# Patient Record
Sex: Female | Born: 1970 | Race: White | Hispanic: No | Marital: Married | Smoking: Never smoker
Health system: Southern US, Community
[De-identification: ages and names within clinical notes are randomized; demographics above are authoritative.]

## PROBLEM LIST (undated history)

## (undated) DIAGNOSIS — N289 Disorder of kidney and ureter, unspecified: Secondary | ICD-10-CM

## (undated) DIAGNOSIS — IMO0002 Reserved for concepts with insufficient information to code with codable children: Secondary | ICD-10-CM

## (undated) DIAGNOSIS — R519 Headache, unspecified: Secondary | ICD-10-CM

## (undated) DIAGNOSIS — B029 Zoster without complications: Secondary | ICD-10-CM

## (undated) DIAGNOSIS — K8020 Calculus of gallbladder without cholecystitis without obstruction: Secondary | ICD-10-CM

## (undated) DIAGNOSIS — F988 Other specified behavioral and emotional disorders with onset usually occurring in childhood and adolescence: Secondary | ICD-10-CM

## (undated) DIAGNOSIS — Z808 Family history of malignant neoplasm of other organs or systems: Secondary | ICD-10-CM

## (undated) DIAGNOSIS — E88819 Insulin resistance, unspecified: Secondary | ICD-10-CM

## (undated) DIAGNOSIS — E8881 Metabolic syndrome: Secondary | ICD-10-CM

## (undated) DIAGNOSIS — S060XAA Concussion with loss of consciousness status unknown, initial encounter: Secondary | ICD-10-CM

## (undated) DIAGNOSIS — B019 Varicella without complication: Secondary | ICD-10-CM

## (undated) DIAGNOSIS — K219 Gastro-esophageal reflux disease without esophagitis: Secondary | ICD-10-CM

## (undated) DIAGNOSIS — J189 Pneumonia, unspecified organism: Secondary | ICD-10-CM

## (undated) DIAGNOSIS — F32A Depression, unspecified: Secondary | ICD-10-CM

## (undated) DIAGNOSIS — F419 Anxiety disorder, unspecified: Secondary | ICD-10-CM

## (undated) DIAGNOSIS — N200 Calculus of kidney: Secondary | ICD-10-CM

## (undated) DIAGNOSIS — M329 Systemic lupus erythematosus, unspecified: Secondary | ICD-10-CM

## (undated) DIAGNOSIS — T7840XA Allergy, unspecified, initial encounter: Secondary | ICD-10-CM

## (undated) DIAGNOSIS — E785 Hyperlipidemia, unspecified: Secondary | ICD-10-CM

## (undated) DIAGNOSIS — F329 Major depressive disorder, single episode, unspecified: Secondary | ICD-10-CM

## (undated) DIAGNOSIS — E282 Polycystic ovarian syndrome: Secondary | ICD-10-CM

## (undated) DIAGNOSIS — G8929 Other chronic pain: Secondary | ICD-10-CM

## (undated) HISTORY — PX: LASER ABLATION: SHX1947

## (undated) HISTORY — DX: Allergy, unspecified, initial encounter: T78.40XA

## (undated) HISTORY — DX: Family history of malignant neoplasm of other organs or systems: Z80.8

## (undated) HISTORY — DX: Zoster without complications: B02.9

## (undated) HISTORY — DX: Major depressive disorder, single episode, unspecified: F32.9

## (undated) HISTORY — DX: Anxiety disorder, unspecified: F41.9

## (undated) HISTORY — DX: Metabolic syndrome: E88.81

## (undated) HISTORY — DX: Headache, unspecified: R51.9

## (undated) HISTORY — PX: BUNIONECTOMY: SHX129

## (undated) HISTORY — PX: HIATAL HERNIA REPAIR: SHX195

## (undated) HISTORY — DX: Pneumonia, unspecified organism: J18.9

## (undated) HISTORY — PX: BREAST BIOPSY: SHX20

## (undated) HISTORY — DX: Other specified behavioral and emotional disorders with onset usually occurring in childhood and adolescence: F98.8

## (undated) HISTORY — DX: Systemic lupus erythematosus, unspecified: M32.9

## (undated) HISTORY — DX: Insulin resistance, unspecified: E88.819

## (undated) HISTORY — DX: Hyperlipidemia, unspecified: E78.5

## (undated) HISTORY — DX: Calculus of gallbladder without cholecystitis without obstruction: K80.20

## (undated) HISTORY — PX: COLONOSCOPY: SHX174

## (undated) HISTORY — DX: Varicella without complication: B01.9

## (undated) HISTORY — DX: Polycystic ovarian syndrome: E28.2

## (undated) HISTORY — DX: Calculus of kidney: N20.0

## (undated) HISTORY — PX: CHOLECYSTECTOMY: SHX55

## (undated) HISTORY — DX: Depression, unspecified: F32.A

## (undated) HISTORY — PX: TONSILLECTOMY: SUR1361

## (undated) HISTORY — DX: Other chronic pain: G89.29

## (undated) HISTORY — PX: WISDOM TOOTH EXTRACTION: SHX21

---
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
## 2000-01-20 ENCOUNTER — Other Ambulatory Visit: Admission: RE | Admit: 2000-01-20 | Discharge: 2000-01-20 | Payer: Self-pay | Admitting: Obstetrics and Gynecology

## 2001-05-17 DIAGNOSIS — J189 Pneumonia, unspecified organism: Secondary | ICD-10-CM

## 2001-05-17 HISTORY — DX: Pneumonia, unspecified organism: J18.9

## 2005-05-25 ENCOUNTER — Other Ambulatory Visit: Admission: RE | Admit: 2005-05-25 | Discharge: 2005-05-25 | Payer: Self-pay | Admitting: Obstetrics and Gynecology

## 2005-08-30 ENCOUNTER — Ambulatory Visit: Payer: Self-pay | Admitting: Internal Medicine

## 2006-04-29 ENCOUNTER — Ambulatory Visit (HOSPITAL_COMMUNITY): Admission: RE | Admit: 2006-04-29 | Discharge: 2006-04-29 | Payer: Self-pay | Admitting: Obstetrics and Gynecology

## 2006-05-07 ENCOUNTER — Emergency Department (HOSPITAL_COMMUNITY): Admission: EM | Admit: 2006-05-07 | Discharge: 2006-05-07 | Payer: Self-pay | Admitting: Emergency Medicine

## 2006-09-18 ENCOUNTER — Inpatient Hospital Stay (HOSPITAL_COMMUNITY): Admission: AD | Admit: 2006-09-18 | Discharge: 2006-09-18 | Payer: Self-pay | Admitting: Obstetrics and Gynecology

## 2006-09-22 ENCOUNTER — Encounter (INDEPENDENT_AMBULATORY_CARE_PROVIDER_SITE_OTHER): Payer: Self-pay | Admitting: Specialist

## 2006-09-22 ENCOUNTER — Inpatient Hospital Stay (HOSPITAL_COMMUNITY): Admission: AD | Admit: 2006-09-22 | Discharge: 2006-09-24 | Payer: Self-pay | Admitting: Obstetrics and Gynecology

## 2007-03-06 ENCOUNTER — Telehealth: Payer: Self-pay | Admitting: Internal Medicine

## 2007-03-07 ENCOUNTER — Ambulatory Visit: Payer: Self-pay | Admitting: Internal Medicine

## 2007-03-07 DIAGNOSIS — R109 Unspecified abdominal pain: Secondary | ICD-10-CM | POA: Insufficient documentation

## 2007-03-07 DIAGNOSIS — F329 Major depressive disorder, single episode, unspecified: Secondary | ICD-10-CM

## 2007-03-07 DIAGNOSIS — F3289 Other specified depressive episodes: Secondary | ICD-10-CM | POA: Insufficient documentation

## 2007-03-07 DIAGNOSIS — R0609 Other forms of dyspnea: Secondary | ICD-10-CM

## 2007-03-07 DIAGNOSIS — R0989 Other specified symptoms and signs involving the circulatory and respiratory systems: Secondary | ICD-10-CM

## 2007-03-07 LAB — CONVERTED CEMR LAB
AST: 18 units/L (ref 0–37)
BUN: 12 mg/dL (ref 6–23)
Bilirubin Urine: NEGATIVE
Bilirubin, Direct: 0.1 mg/dL (ref 0.0–0.3)
CO2: 33 meq/L — ABNORMAL HIGH (ref 19–32)
Chloride: 105 meq/L (ref 96–112)
Creatinine, Ser: 0.8 mg/dL (ref 0.4–1.2)
Glucose, Bld: 74 mg/dL (ref 70–99)
HCT: 37.5 % (ref 36.0–46.0)
Hemoglobin: 13 g/dL (ref 12.0–15.0)
Lymphocytes Relative: 25.5 % (ref 12.0–46.0)
MCHC: 34.7 g/dL (ref 30.0–36.0)
MCV: 83.6 fL (ref 78.0–100.0)
Monocytes Absolute: 0.7 10*3/uL (ref 0.2–0.7)
Monocytes Relative: 13.4 % — ABNORMAL HIGH (ref 3.0–11.0)
Neutrophils Relative %: 60.2 % (ref 43.0–77.0)
Platelets: 235 10*3/uL (ref 150–400)
Specific Gravity, Urine: 1.02
Total Bilirubin: 0.5 mg/dL (ref 0.3–1.2)
Total Protein: 7.1 g/dL (ref 6.0–8.3)
Urobilinogen, UA: 0.2
WBC: 5.5 10*3/uL (ref 4.5–10.5)
pH: 6.5

## 2007-03-09 ENCOUNTER — Telehealth: Payer: Self-pay | Admitting: *Deleted

## 2007-03-13 ENCOUNTER — Ambulatory Visit: Payer: Self-pay | Admitting: Cardiology

## 2007-03-13 ENCOUNTER — Encounter: Payer: Self-pay | Admitting: Internal Medicine

## 2007-03-17 ENCOUNTER — Ambulatory Visit: Payer: Self-pay | Admitting: Internal Medicine

## 2007-03-17 DIAGNOSIS — I880 Nonspecific mesenteric lymphadenitis: Secondary | ICD-10-CM

## 2007-03-17 DIAGNOSIS — N200 Calculus of kidney: Secondary | ICD-10-CM | POA: Insufficient documentation

## 2007-03-17 DIAGNOSIS — R197 Diarrhea, unspecified: Secondary | ICD-10-CM

## 2007-03-22 ENCOUNTER — Ambulatory Visit: Payer: Self-pay | Admitting: Internal Medicine

## 2007-03-22 DIAGNOSIS — R109 Unspecified abdominal pain: Secondary | ICD-10-CM | POA: Insufficient documentation

## 2007-03-23 ENCOUNTER — Encounter: Payer: Self-pay | Admitting: Internal Medicine

## 2007-03-24 ENCOUNTER — Telehealth: Payer: Self-pay | Admitting: Internal Medicine

## 2007-04-12 ENCOUNTER — Telehealth: Payer: Self-pay | Admitting: Internal Medicine

## 2007-08-11 ENCOUNTER — Encounter: Payer: Self-pay | Admitting: Internal Medicine

## 2007-08-15 ENCOUNTER — Telehealth: Payer: Self-pay | Admitting: Internal Medicine

## 2007-10-06 ENCOUNTER — Encounter: Payer: Self-pay | Admitting: Internal Medicine

## 2007-10-13 ENCOUNTER — Ambulatory Visit: Payer: Self-pay | Admitting: Internal Medicine

## 2007-10-13 DIAGNOSIS — G43909 Migraine, unspecified, not intractable, without status migrainosus: Secondary | ICD-10-CM | POA: Insufficient documentation

## 2007-10-13 DIAGNOSIS — G43919 Migraine, unspecified, intractable, without status migrainosus: Secondary | ICD-10-CM | POA: Insufficient documentation

## 2007-10-13 DIAGNOSIS — J309 Allergic rhinitis, unspecified: Secondary | ICD-10-CM | POA: Insufficient documentation

## 2007-10-13 DIAGNOSIS — Z87442 Personal history of urinary calculi: Secondary | ICD-10-CM | POA: Insufficient documentation

## 2007-11-15 ENCOUNTER — Telehealth: Payer: Self-pay | Admitting: Internal Medicine

## 2007-11-16 ENCOUNTER — Telehealth: Payer: Self-pay | Admitting: Internal Medicine

## 2007-11-21 ENCOUNTER — Telehealth: Payer: Self-pay | Admitting: Family Medicine

## 2007-11-24 ENCOUNTER — Ambulatory Visit: Payer: Self-pay | Admitting: Internal Medicine

## 2007-11-26 DIAGNOSIS — R51 Headache: Secondary | ICD-10-CM

## 2007-11-26 DIAGNOSIS — R519 Headache, unspecified: Secondary | ICD-10-CM | POA: Insufficient documentation

## 2007-12-03 ENCOUNTER — Telehealth: Payer: Self-pay | Admitting: Family Medicine

## 2007-12-04 ENCOUNTER — Emergency Department (HOSPITAL_COMMUNITY): Admission: EM | Admit: 2007-12-04 | Discharge: 2007-12-04 | Payer: Self-pay | Admitting: *Deleted

## 2007-12-05 ENCOUNTER — Telehealth: Payer: Self-pay | Admitting: *Deleted

## 2007-12-05 ENCOUNTER — Ambulatory Visit: Payer: Self-pay | Admitting: Internal Medicine

## 2007-12-05 DIAGNOSIS — F909 Attention-deficit hyperactivity disorder, unspecified type: Secondary | ICD-10-CM | POA: Insufficient documentation

## 2007-12-11 ENCOUNTER — Telehealth: Payer: Self-pay | Admitting: Internal Medicine

## 2008-03-07 ENCOUNTER — Telehealth (INDEPENDENT_AMBULATORY_CARE_PROVIDER_SITE_OTHER): Payer: Self-pay | Admitting: *Deleted

## 2008-03-12 ENCOUNTER — Telehealth: Payer: Self-pay | Admitting: *Deleted

## 2008-07-15 ENCOUNTER — Ambulatory Visit: Payer: Self-pay | Admitting: Internal Medicine

## 2008-07-15 DIAGNOSIS — R5383 Other fatigue: Secondary | ICD-10-CM

## 2008-07-15 DIAGNOSIS — R5381 Other malaise: Secondary | ICD-10-CM

## 2008-07-15 LAB — CONVERTED CEMR LAB: Pap Smear: NORMAL

## 2008-07-18 ENCOUNTER — Ambulatory Visit: Payer: Self-pay | Admitting: Internal Medicine

## 2008-07-19 ENCOUNTER — Encounter: Payer: Self-pay | Admitting: Internal Medicine

## 2008-07-19 LAB — CONVERTED CEMR LAB: PTH: 27 pg/mL (ref 14.0–72.0)

## 2008-07-30 ENCOUNTER — Ambulatory Visit: Payer: Self-pay | Admitting: Internal Medicine

## 2008-07-30 DIAGNOSIS — J029 Acute pharyngitis, unspecified: Secondary | ICD-10-CM | POA: Insufficient documentation

## 2008-07-31 ENCOUNTER — Encounter: Payer: Self-pay | Admitting: Internal Medicine

## 2008-09-27 ENCOUNTER — Ambulatory Visit: Payer: Self-pay | Admitting: Internal Medicine

## 2008-09-27 DIAGNOSIS — G479 Sleep disorder, unspecified: Secondary | ICD-10-CM | POA: Insufficient documentation

## 2008-11-21 ENCOUNTER — Telehealth: Payer: Self-pay | Admitting: Internal Medicine

## 2009-01-14 ENCOUNTER — Telehealth: Payer: Self-pay | Admitting: *Deleted

## 2009-01-14 ENCOUNTER — Encounter: Payer: Self-pay | Admitting: Internal Medicine

## 2009-05-29 ENCOUNTER — Telehealth: Payer: Self-pay | Admitting: *Deleted

## 2009-07-01 ENCOUNTER — Ambulatory Visit: Payer: Self-pay | Admitting: Internal Medicine

## 2009-07-01 ENCOUNTER — Telehealth: Payer: Self-pay | Admitting: Internal Medicine

## 2009-07-01 DIAGNOSIS — H9209 Otalgia, unspecified ear: Secondary | ICD-10-CM | POA: Insufficient documentation

## 2009-12-19 ENCOUNTER — Ambulatory Visit: Payer: Self-pay | Admitting: Internal Medicine

## 2009-12-19 DIAGNOSIS — H60399 Other infective otitis externa, unspecified ear: Secondary | ICD-10-CM | POA: Insufficient documentation

## 2010-01-20 ENCOUNTER — Telehealth: Payer: Self-pay | Admitting: *Deleted

## 2010-02-13 ENCOUNTER — Telehealth: Payer: Self-pay | Admitting: *Deleted

## 2010-04-03 ENCOUNTER — Encounter: Payer: Self-pay | Admitting: Internal Medicine

## 2010-04-27 ENCOUNTER — Encounter
Admission: RE | Admit: 2010-04-27 | Discharge: 2010-04-27 | Payer: Self-pay | Source: Home / Self Care | Attending: Endocrinology | Admitting: Endocrinology

## 2010-06-14 LAB — CONVERTED CEMR LAB
Albumin: 3.9 g/dL (ref 3.5–5.2)
Basophils Relative: 0.2 % (ref 0.0–3.0)
Calcium: 9.2 mg/dL (ref 8.4–10.5)
Chloride: 107 meq/L (ref 96–112)
Creatinine, Ser: 0.8 mg/dL (ref 0.4–1.2)
Eosinophils Relative: 2.2 % (ref 0.0–5.0)
Free T4: 0.7 ng/dL (ref 0.6–1.6)
GFR calc Af Amer: 104 mL/min
Hemoglobin: 13.4 g/dL (ref 12.0–15.0)
LDL Cholesterol: 101 mg/dL — ABNORMAL HIGH (ref 0–99)
Monocytes Absolute: 0.7 10*3/uL (ref 0.1–1.0)
Monocytes Relative: 18.1 % — ABNORMAL HIGH (ref 3.0–12.0)
Neutrophils Relative %: 44.4 % (ref 43.0–77.0)
RBC: 4.67 M/uL (ref 3.87–5.11)
RDW: 13.1 % (ref 11.5–14.6)
T3, Free: 3.3 pg/mL (ref 2.3–4.2)
TSH: 1.51 microintl units/mL (ref 0.35–5.50)
Total Bilirubin: 0.8 mg/dL (ref 0.3–1.2)
Total CHOL/HDL Ratio: 4.9
Total Protein: 7 g/dL (ref 6.0–8.3)
Triglycerides: 105 mg/dL (ref 0–149)
WBC: 3.9 10*3/uL — ABNORMAL LOW (ref 4.5–10.5)

## 2010-06-18 NOTE — Assessment & Plan Note (Signed)
Summary: viral illness?/dm   Vital Signs:  Patient profile:   40 year old female Menstrual status:  irregular LMP:     06/25/2009 Height:      64.75 inches Weight:      178 pounds BMI:     29.96 Temp:     98.2 degrees F oral Pulse rate:   66 / minute BP sitting:   122 / 72  (left arm) Cuff size:   regular  Vitals Entered By: Romualdo Bolk, CMA (AAMA) (July 01, 2009 1:08 PM) CC: ?flu- Coughing, no fever, fatigue, sinus and ear pain but this has passed. Now just has coughing and fatigue. Some ear and sinus pain. This has been going on for 2 weeks. LMP (date): 06/25/2009 LMP - Character: normal-IUD Menarche (age onset years): 11   Menses interval (days): 29-30 Menstrual flow (days): 3-4 Enter LMP: 06/25/2009 Last PAP Result normal   History of Present Illness: Jordan Mayer comesin as a work in  for above.  Onset with HA and  cough and then ear pain   left more than right  anmd sinus pain and  exhausted.   going on for 2 weeks.   Was using netti pot  early on.    no sig nasal congestion now .   Cough is croupy now deep  harder to sleep last pm. .   .   Tendency for bronchitis  ? post infectious  ?  has hx of pneumonia    NO fever.   but did have some chills . No cp or sob.   Allergies not flaring now but has hx of sig allergy. Family also tends to get prolonged oughing illnesses.   Preventive Screening-Counseling & Management  Alcohol-Tobacco     Alcohol drinks/day: <1     Alcohol type: wine     Smoking Status: never  Caffeine-Diet-Exercise     Caffeine use/day: 2-3 per week     Does Patient Exercise: yes     Type of exercise: tennis, cardio and wts     Exercise (avg: min/session): >60     Times/week: 4-8  Current Medications (verified): 1)  Xyzal 5 Mg  Tabs (Levocetirizine Dihydrochloride) 2)  Multivitamins   Tabs (Multiple Vitamin) 3)  Ventolin Hfa 108 (90 Base) Mcg/act  Aers (Albuterol Sulfate) .Marland Kitchen.. 1-2 Puffs Pre Exercise As Needed 4)  Nasonex 50 Mcg/act   Susp (Mometasone Furoate) 5)  Sonata 10 Mg Caps (Zaleplon) .Marland Kitchen.. 1 By Mouth At Bedtime 6)  Mirena 20 Mcg/24hr Iud (Levonorgestrel) 7)  Celexa 10 Mg Tabs (Citalopram Hydrobromide) .Marland Kitchen.. 1 By Mouth Once Daily 8)  Alprazolam 0.25 Mg Tabs (Alprazolam) .... 1/2 To 1 By Mouth Once Daily  Allergies (verified): 1)  ! Penicillin 2)  ! * Ivp Dye  Past History:  Past medical, surgical, family and social histories (including risk factors) reviewed, and no changes noted (except as noted below).  Past Medical History: Depression childbirth x1  g1p1 Allergic rhinitis  with asthma Nephrolithiasis, hx of Headache Hx of   infertility ?   Was told shecould have CFS  hx [facility] Consults Dr. Merry Lofty Dr. Deedra Ehrich   Past History:  Care Management: Gynecology: Dr. Aldona Bar Allergy: Dr. Gary Fleet Psychiatry: Excell Seltzer  Family History: Reviewed history from 07/15/2008 and no changes required. noncontributory no scd  Father:  healthy Mother:  healthy Siblings:  healthy  NO hormonal syndromes noted  Social History: Reviewed history from 07/15/2008 and no changes required. Married non-smoker child at home  Review of Systems       The patient complains of hoarseness and headaches.  The patient denies anorexia, weight loss, decreased hearing, chest pain, syncope, dyspnea on exertion, peripheral edema, hemoptysis, abdominal pain, melena, hematochezia, severe indigestion/heartburn, transient blindness, difficulty walking, abnormal bleeding, and angioedema.         had enlarged left ac node at onset   Physical Exam  General:  alert, well-developed, well-nourished, and well-hydrated.  looks washed out. Head:  normocephalic and atraumatic.   Eyes:  vision grossly intact, pupils equal, and pupils round.   Ears:  R ear normal, L ear normal, and no external deformities.  some tenderness at tmj area  Nose:  no external deformity, no external erythema, and no nasal discharge.  looks 1+ congested   non tender  Mouth:  mild erythema no lesions Neck:  shoddy nodes  Lungs:  Normal respiratory effort, chest expands symmetrically. Lungs are clear to auscultation, no crackles or wheezes.no dullness.   Heart:  Normal rate and regular rhythm. S1 and S2 normal without gallop, murmur, click, rub or other extra sounds.no lifts.   Pulses:  pulses intact without delay   Neurologic:  non focal  Skin:  turgor normal, color normal, no ecchymoses, and no petechiae.   Cervical Nodes:  no posterior cervical adenopathy.  shoddy nodes  anterior  Psych:  Oriented X3, good eye contact, not anxious appearing, and not depressed appearing.     Impression & Recommendations:  Problem # 1:  OTALGIA (ICD-388.70) with headache and cough    ongoing and unclear  which cause but ok to empirically treat for atypcials  with past hx  .  HA is separate. Her updated medication list for this problem includes:    Azithromycin 250 Mg Tabs (Azithromycin) .Marland Kitchen... Take 2 by mouth  x 1 then 1 by mouth once daily  Problem # 2:  COUGH (ICD-786.2) prob infectious   viral vs  atypical possible.    hx of prolonged coughs   consider allergic phenom also  with hx of asthma if ongoing.      Complete Medication List: 1)  Xyzal 5 Mg Tabs (Levocetirizine dihydrochloride) 2)  Multivitamins Tabs (Multiple vitamin) 3)  Ventolin Hfa 108 (90 Base) Mcg/act Aers (Albuterol sulfate) .Marland Kitchen.. 1-2 puffs pre exercise as needed 4)  Nasonex 50 Mcg/act Susp (Mometasone furoate) 5)  Sonata 10 Mg Caps (Zaleplon) .Marland Kitchen.. 1 by mouth at bedtime 6)  Mirena 20 Mcg/24hr Iud (Levonorgestrel) 7)  Celexa 10 Mg Tabs (Citalopram hydrobromide) .Marland Kitchen.. 1 by mouth once daily 8)  Alprazolam 0.25 Mg Tabs (Alprazolam) .... 1/2 to 1 by mouth once daily 9)  Azithromycin 250 Mg Tabs (Azithromycin) .... Take 2 by mouth  x 1 then 1 by mouth once daily  Patient Instructions: 1)  call if  cough not improving in the next 7-10 days  2)  consider prednisone then 3)  Also  call if any  fever.  Prescriptions: AZITHROMYCIN 250 MG TABS (AZITHROMYCIN) take 2 by mouth  x 1 then 1 by mouth once daily  #6 x 0   Entered and Authorized by:   Madelin Headings MD   Signed by:   Madelin Headings MD on 07/01/2009   Method used:   [facility]        [facility] [address] [phone]* (retail)       [address]       [place], [place]  [address]       Ph: [phone] or [phone]       Fax: [phone]   RxID:   [id number]

## 2010-06-18 NOTE — Progress Notes (Signed)
Summary: appt?  Phone Note Call from Patient   Caller: Patient Call For: Madelin Headings MD Reason for Call: Acute Illness Complaint: Cough/Sore throat Summary of Call: Pt is coughing, has head congestion, headache with eye pain x 2 weeks, and is asking to see Dr. Fabian Sharp.  No fever but feels really bad [phone] Initial call taken by: Lynann Beaver CMA,  July 01, 2009 9:15 AM  Follow-up for Phone Call        would have to  work her in  in the afternoon around 115   or else see another provider  Follow-up by: Madelin Headings MD,  July 01, 2009 9:25 AM  Additional Follow-up for Phone Call Additional follow up Details #1::        Scheduled appt. Additional Follow-up by: Lynann Beaver CMA,  July 01, 2009 9:33 AM

## 2010-06-18 NOTE — Assessment & Plan Note (Signed)
Summary: ear inj/pain/cjr   Vital Signs:  Patient profile:   40 year old female Menstrual status:  iud Height:      64.75 inches Weight:      178 pounds Temp:     98.6 degrees F oral Pulse rate:   72 / minute BP sitting:   120 / 70  (left arm) Cuff size:   regular  Vitals Entered By: Romualdo Bolk, CMA (AAMA) (December 19, 2009 9:56 AM) CC: Left ear, teeth, neck and jaw pain that started on 8/4. Pt states that she had some sinus pressure going this week. No coughing or congestion. LMP - Character: normal-IUD Menarche (age onset years): 11   Menses interval (days): 29-30 Menstrual flow (days): 3-4 Menstrual Status iud Last PAP Result normal   History of Present Illness: Jordan Mayer comes in today  for sda today for above .  had mild congestion and then awoke from sleep with left ear pain  slightly  less upright now . pain continuous and interfereing .   no fever URI signs or hurts to swallow . tried   no meds.      Has had a  allergy congestion recently but no fever.  Swims 3  per [facility] and plays tennis. nothing in ear.  No dental pain.Marland Kitchen left face feels  somehat involved but no face pain.   Preventive Screening-Counseling & Management  Alcohol-Tobacco     Alcohol drinks/day: <1     Alcohol type: wine     Smoking Status: never  Caffeine-Diet-Exercise     Caffeine use/day: 2-3 per week     Does Patient Exercise: yes     Type of exercise: tennis, cardio and wts     Exercise (avg: min/session): >60     Times/week: 4-8  Current Medications (verified): 1)  Xyzal 5 Mg  Tabs (Levocetirizine Dihydrochloride) 2)  Multivitamins   Tabs (Multiple Vitamin) 3)  Ventolin Hfa 108 (90 Base) Mcg/act  Aers (Albuterol Sulfate) .Marland Kitchen.. 1-2 Puffs Pre Exercise As Needed 4)  Nasonex 50 Mcg/act  Susp (Mometasone Furoate) 5)  Sonata 10 Mg Caps (Zaleplon) .Marland Kitchen.. 1 By Mouth At Bedtime 6)  Mirena 20 Mcg/24hr Iud (Levonorgestrel) 7)  Adderall 10 Mg Tabs (Amphetamine-Dextroamphetamine) .... 1/2 To 1  Once Daily As Needed  Allergies (verified): 1)  ! Penicillin 2)  ! * Ivp Dye  Past History:  Past medical, surgical, family and social histories (including risk factors) reviewed, and no changes noted (except as noted below).  Past Medical History: Reviewed history from 07/01/2009 and no changes required. 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   Was told shecould have CFS  hx [facility] Consults Dr. Merry Lofty Dr. Deedra Ehrich   Past History:  Care Management: Gynecology: Dr. Aldona Bar Allergy: Dr. Gary Fleet Psychiatry: Excell Seltzer  Family History: Reviewed history from 07/15/2008 and no changes required. noncontributory no scd  Father:  healthy Mother:  healthy Siblings:  healthy  NO hormonal syndromes noted  Social History: Reviewed history from 07/15/2008 and no changes required. Married non-smoker child at home        Review of Systems  The patient denies anorexia, fever, weight loss, weight gain, vision loss, prolonged cough, abdominal pain, and abnormal bleeding.    Physical Exam  General:  Well-developed,well-nourished,in no acute distress; alert,appropriate and cooperative throughout examination Head:  Normocephalic and atraumatic without obvious abnormalities. No apparent alopecia or balding. Eyes:  PERRL, EOMs full,  conjunctiva clear  Ears:  left eac 2+ edema and redness R ear normal, R pinna tender, and R tragus tender.   no drainage  tm bony lm nls  sight serythema to pars tnsa with streaking ? clear fluid  Nose:  no external deformity, no external erythema, and no nasal discharge.   Mouth:  good dentition and pharynx pink and moist.   Neck:  tender lef ac area but no adenopathy and no face pain Lungs:  Normal respiratory effort, chest expands symmetrically. Lungs are clear to auscultation, no crackles or wheezes. Heart:  Normal rate and regular rhythm. S1 and S2 normal without gallop,  murmur, click, rub or other extra sounds. Pulses:  nl cap refill  Neurologic:  grossly non focal  Skin:  turgor normal and color normal.   Cervical Nodes:  no posterior cervical adenopathy and L anterior LN tender.   Psych:  Oriented X3, good eye contact, not anxious appearing, and not depressed appearing.     Impression & Recommendations:  Problem # 1:  OTITIS EXTERNA (ICD-380.10)  righ.Marland Kitchent  disc   rx  Discussed symptomatic treatment and preventive measures.   Orders: Prescription Created Electronically [phone])  Problem # 2:  OTALGIA (ICD-388.70)  may have a sinusitis  also based on character of pain and exam ...  vs referred pain.   caution aabout theoreticalx rx pcn and  med  low risk The following medications were removed from the medication list:    Azithromycin 250 Mg Tabs (Azithromycin) .Marland Kitchen... Take 2 by mouth  x 1 then 1 by mouth once daily Her updated medication list for this problem includes:    Cefdinir 300 Mg Caps (Cefdinir) .Marland Kitchen... 1 by mouth two times a day  for sinusitis  Orders: Prescription Created Electronically [phone])  Complete Medication List: 1)  Xyzal 5 Mg Tabs (Levocetirizine dihydrochloride) 2)  Multivitamins Tabs (Multiple vitamin) 3)  Ventolin Hfa 108 (90 Base) Mcg/act Aers (Albuterol sulfate) .Marland Kitchen.. 1-2 puffs pre exercise as needed 4)  Nasonex 50 Mcg/act Susp (Mometasone furoate) 5)  Sonata 10 Mg Caps (Zaleplon) .Marland Kitchen.. 1 by mouth at bedtime 6)  Mirena 20 Mcg/24hr Iud (Levonorgestrel) 7)  Adderall 10 Mg Tabs (Amphetamine-dextroamphetamine) .... 1/2 to 1 once daily as needed 8)  Ofloxacin 0.3 % Soln (Ofloxacin) .Marland Kitchen.. 10 qtts   in left ear canal once daily for 7 days or as directed . 9)  Cefdinir 300 Mg Caps (Cefdinir) .Marland Kitchen.. 1 by mouth two times a day  for sinusitis  Patient Instructions: 1)  treat for  otitis externa   ...  2)  add an  oral antibioitc  if  sinus pain and ear pain getting worse .  Prescriptions: CEFDINIR 300 MG CAPS (CEFDINIR) 1 by mouth two  times a day  for sinusitis  #20 x 0   Entered and Authorized by:   Madelin Headings MD   Signed by:   Madelin Headings MD on 12/19/2009   Method used:   Print then Give to Patient   RxID:   [id number] OFLOXACIN 0.3 % SOLN (OFLOXACIN) 10 qtts   in left ear canal once daily for 7 days or as directed .  #1 bottle x 0   Entered and Authorized by:   Madelin Headings MD   Signed by:   Madelin Headings MD on 12/19/2009   Method used:   [facility] to        [facility] [address] [phone]* (retail)  [address]       [place], [place]  [address]       Ph: [phone] or [phone]       Fax: [phone]   RxID:   [id number]

## 2010-06-18 NOTE — Letter (Signed)
Summary: [facility]   Imported By: Maryln Gottron 05/20/2010 15:59:19  _____________________________________________________________________  External Attachment:    Type:   Image     Comment:   External Document

## 2010-06-18 NOTE — Progress Notes (Signed)
Summary: referral to Dr. Horald Pollen   Phone Note Call from Patient Call back at [phone]   Caller: Patient Summary of Call: Pt is wanting a referral to Dr. Talmage Nap because her thyroid being off. Pt also had a problem trying to get preg. Initial call taken by: Romualdo Bolk, CMA Duncan Dull),  February 13, 2010 2:39 PM  Follow-up for Phone Call        ok to refer send  labs from the last year and OV here from the last year. Follow-up by: Madelin Headings MD,  February 13, 2010 5:07 PM  Additional Follow-up for Phone Call Additional follow up Details #1::        Order sent to [facility]. Additional Follow-up by: Romualdo Bolk, CMA (AAMA),  February 13, 2010 5:12 PM

## 2010-06-18 NOTE — Progress Notes (Signed)
Summary: Pt req refill of Inhaler. [facility]  Phone Note Call from Patient Call back at [phone] cell   Caller: Patient Summary of Call: Pt called and is req script for another Inhaler. [facility]   Initial call taken by: Lucy Antigua,  January 20, 2010 4:41 PM  Follow-up for Phone Call        ok x 1  Follow-up by: Madelin Headings MD,  January 21, 2010 9:30 AM  Additional Follow-up for Phone Call Additional follow up Details #1::        Rx sent to pharmacy Additional Follow-up by: Romualdo Bolk, CMA Duncan Dull),  January 21, 2010 11:35 AM    Prescriptions: VENTOLIN HFA 108 (90 BASE) MCG/ACT  AERS (ALBUTEROL SULFATE) 1-2 puffs pre exercise as needed  #1 x 0   Entered by:   Romualdo Bolk, CMA (AAMA)   Authorized by:   Madelin Headings MD   Signed by:   Romualdo Bolk, CMA (AAMA) on 01/21/2010   Method used:   Electronically to        [facility] [address] [phone]* (retail)       [address]       [place], [place]  [address]       Ph: [phone] or [phone]       Fax: [phone]   RxID:   [id number]

## 2010-06-18 NOTE — Progress Notes (Signed)
Summary: Pt needing a rx of xanax  Phone Note Call from Patient Call back at [facility] Phone [phone] Call back at [phone] (cell)    Caller: Patient Summary of Call: Pt left a voicemail saying that she needs some xanax. I left pt a message to give [place] more info on why she is needing this and if she is seeing Valinda Hoar or not. Initial call taken by: Romualdo Bolk, CMA Duncan Dull),  May 29, 2009 11:43 AM  Follow-up for Phone Call        Spoke to pt and she states that she is planning an event in March for 600 people. She is not currently seeing Valinda Hoar. Pt states that she is under lot of stress trying to plan this event and needs something to help calm her down or take the edge off. [facility]. Pt is willing to come in if needed.  Follow-up by: Romualdo Bolk, CMA Duncan Dull),  June 03, 2009 10:28 AM  Additional Follow-up for Phone Call Additional follow up Details #1::        needs OV  Additional Follow-up by: Madelin Headings MD,  June 03, 2009 5:36 PM    Additional Follow-up for Phone Call Additional follow up Details #2::    Pt aware and appt made Follow-up by: Romualdo Bolk, CMA (AAMA),  June 04, 2009 8:58 AM

## 2010-09-17 ENCOUNTER — Ambulatory Visit (INDEPENDENT_AMBULATORY_CARE_PROVIDER_SITE_OTHER): Payer: 59 | Admitting: Internal Medicine

## 2010-09-17 ENCOUNTER — Encounter: Payer: Self-pay | Admitting: Internal Medicine

## 2010-09-17 DIAGNOSIS — H9209 Otalgia, unspecified ear: Secondary | ICD-10-CM

## 2010-09-17 DIAGNOSIS — H9203 Otalgia, bilateral: Secondary | ICD-10-CM | POA: Insufficient documentation

## 2010-09-17 NOTE — Progress Notes (Signed)
  Subjective:    Patient ID: Jordan Mayer, female    DOB: July 18, 1970, 40 y.o.   MRN: [id number]  HPI Pt presents to clinic for evaluation of ear pain. Awoke this am with bilateral ear pain without discharge, loss of hearing, neck pain, fever or chills. Has chronic allergic rhinits sx's of nasal congestion and drainage treated with nasonex and xyzal. Pain does not radiate towards neck and is not worsened with jaw movement. No other alleviating or exacerbating factors. No other complaints.  Reviewed pmh, medications and allergies    Review of Systems  Constitutional: Negative for fever and chills.  HENT: Positive for ear pain, congestion and rhinorrhea. Negative for hearing loss, facial swelling, neck pain, neck stiffness and ear discharge.   Eyes: Negative for discharge and redness.  Respiratory: Negative for cough.        Objective:   Physical Exam  [nursing notereviewed. Constitutional: She appears well-developed and well-nourished. No distress.  HENT:  Head: Normocephalic and atraumatic.  Right Ear: Tympanic membrane, external ear and ear canal normal.  Left Ear: Tympanic membrane, external ear and ear canal normal.  Nose: Nose normal.  Mouth/Throat: Oropharynx is clear and moist. No oropharyngeal exudate.  Eyes: Conjunctivae are normal. No scleral icterus.  Neurological: She is alert.  Skin: Skin is warm and dry. She is not diaphoretic.          Assessment & Plan:

## 2010-09-17 NOTE — Assessment & Plan Note (Signed)
Nl exam. No current evidence to support OM.  Consider eustacian tube etiology. Increase nasonex 2 sprays qd temporarily, continue antihistamine and begin short course of decongestant.  Followup if no improvement or worsening.

## 2010-09-29 NOTE — Assessment & Plan Note (Signed)
[facility]                            CARDIOLOGY OFFICE NOTE   NAME:Jordan Mayer, Jordan Mayer                     MRN:          [id number]  DATE:03/13/2007                            DOB:          Apr 27, 1971    PRIMARY CARE PHYSICIAN:  Neta Mends. Panosh, MD   I was asked as the office physician of the day to assess [name]. She was referred today by Dr.  Fabian Sharp for an outpatient abdominal  and pelvic CT scan with contrast, reportedly due to a history of  abdominal pain. She reported a drug allergy to PENICILLIN, but otherwise  no other specific problems with iodine, shellfish, seafood or  intravenous contrast. She completed her CT scan with no obvious  difficulties. It was noted after the fact that she described a mild  sensation of itching on one of her fingers and scratched this area. The  CT technologist reported seeing a hive on the patient's knuckle of  this finger. The patient also states that she had a general sense of  itching on her right forearm in a fairly focal area and scratched this  with a resulting wheal. She otherwise had no rash or urticaria and was  stable with no complaints and vital signs including oxygen saturation of  97% on room air, heart rate of 67 and regular, and a blood pressure of  90/58. She was observed further and ambulated, and only stated that she  had a mild feeling of itching on the corner of her eye and around her  neck, but again, with no frank rash and no urticaria. She was reassessed  with oxygen saturation of 98% on room air, heart rate 62 and blood  pressure of 102/88. Again, with no other major symptomatology. We  elected to administer Benadryl 50 mg in the clinic and called the  patient's husband to ask him to come and transport the patient home  given concerns about somnolence and driving. We recommended that the  patient take scheduled Benadryl for the next 24 hours and observe her  symptoms closely.  She was also given a post-contrast instruction sheet.  We explained that if she had any worsening in symptoms that she should  call the provided number immediately and/or be evaluated more urgently  through the emergency department. Otherwise, if she continued to remain  stable, we asked her to call her primary care physicians office the  following day and relay this information to see if additional followup  was required. The patient and her husband were very comfortable with  this.     Jonelle Sidle, MD  Electronically Signed    SGM/MedQ  DD: 03/13/2007  DT: 03/13/2007  Job #: [id number]   cc:   Neta Mends. Fabian Sharp, MD

## 2011-02-12 LAB — POCT PREGNANCY, URINE: Preg Test, Ur: NEGATIVE

## 2011-02-12 LAB — DIFFERENTIAL
Basophils Absolute: 0
Eosinophils Absolute: 0.1
Eosinophils Relative: 2
Lymphocytes Relative: 33

## 2011-02-12 LAB — CBC
Hemoglobin: 12.3
MCHC: 33.5
MCV: 83.5
RBC: 4.41
WBC: 5.9

## 2011-02-12 LAB — POCT I-STAT, CHEM 8
Calcium, Ion: 1.23
Chloride: 104
HCT: 37
TCO2: 29

## 2011-08-15 ENCOUNTER — Emergency Department (HOSPITAL_COMMUNITY)
Admission: EM | Admit: 2011-08-15 | Discharge: 2011-08-15 | Disposition: A | Discharge status: Home / Self Care | Payer: BC Managed Care – PPO | Attending: Emergency Medicine | Admitting: Emergency Medicine

## 2011-08-15 ENCOUNTER — Encounter (HOSPITAL_COMMUNITY): Payer: Self-pay

## 2011-08-15 ENCOUNTER — Emergency Department (HOSPITAL_COMMUNITY)
Admission: EM | Admit: 2011-08-15 | Discharge: 2011-08-15 | Disposition: A | Discharge status: Nursing Home (Includes ICF) | Payer: BC Managed Care – PPO | Source: Home / Self Care | Attending: Family Medicine | Admitting: Family Medicine

## 2011-08-15 ENCOUNTER — Emergency Department (HOSPITAL_COMMUNITY): Payer: BC Managed Care – PPO

## 2011-08-15 DIAGNOSIS — R109 Unspecified abdominal pain: Secondary | ICD-10-CM | POA: Insufficient documentation

## 2011-08-15 DIAGNOSIS — Z79899 Other long term (current) drug therapy: Secondary | ICD-10-CM | POA: Insufficient documentation

## 2011-08-15 DIAGNOSIS — R110 Nausea: Secondary | ICD-10-CM | POA: Insufficient documentation

## 2011-08-15 DIAGNOSIS — N200 Calculus of kidney: Secondary | ICD-10-CM | POA: Insufficient documentation

## 2011-08-15 DIAGNOSIS — J45909 Unspecified asthma, uncomplicated: Secondary | ICD-10-CM | POA: Insufficient documentation

## 2011-08-15 LAB — CBC
HCT: 39.4 % (ref 36.0–46.0)
MCV: 86.4 fL (ref 78.0–100.0)
Platelets: 233 10*3/uL (ref 150–400)
RBC: 4.56 MIL/uL (ref 3.87–5.11)
RDW: 12.8 % (ref 11.5–15.5)
WBC: 8 10*3/uL (ref 4.0–10.5)

## 2011-08-15 LAB — URINE MICROSCOPIC-ADD ON

## 2011-08-15 LAB — URINALYSIS, ROUTINE W REFLEX MICROSCOPIC
Glucose, UA: NEGATIVE mg/dL
Hgb urine dipstick: NEGATIVE
pH: 5.5 (ref 5.0–8.0)

## 2011-08-15 LAB — DIFFERENTIAL
Basophils Absolute: 0 10*3/uL (ref 0.0–0.1)
Lymphocytes Relative: 24 % (ref 12–46)
Lymphs Abs: 1.9 10*3/uL (ref 0.7–4.0)
Monocytes Absolute: 0.8 10*3/uL (ref 0.1–1.0)
Neutro Abs: 5.2 10*3/uL (ref 1.7–7.7)

## 2011-08-15 LAB — BASIC METABOLIC PANEL
CO2: 26 mEq/L (ref 19–32)
Chloride: 105 mEq/L (ref 96–112)
Glucose, Bld: 92 mg/dL (ref 70–99)
Sodium: 139 mEq/L (ref 135–145)

## 2011-08-15 LAB — PREGNANCY, URINE: Preg Test, Ur: NEGATIVE

## 2011-08-15 MED ORDER — ONDANSETRON 8 MG PO TBDP: 8.0000 mg | ORAL_TABLET | Freq: Three times a day (TID) | ORAL | Status: AC | PRN

## 2011-08-15 NOTE — ED Provider Notes (Signed)
Medical screening examination/treatment/procedure(s) were conducted as a shared visit with non-physician practitioner(s) and myself.  I personally evaluated the patient during the encounter 38 y female with polycystic ov syndrome c/o rlq abd pain for 3 d. Developed nausea and anorexia today.  No fever, no diarrhea, uti sxs, vag bleeding or discharge.  pe no distress. hrt and lungs nl.  abd soft. + rlq ttp no guarding or rebound.  No rovsig's sign.  No obturator.  ddx appy vs ov dz.  Will check ct, if neg may need [place] of pelvis. Pt does not want pain meds now.  Cheri Guppy, MD 08/15/11 1719

## 2011-08-15 NOTE — ED Provider Notes (Signed)
History     CSN: [id number]  Arrival date & time 08/15/11  1412   First MD Initiated Contact with Patient 08/15/11 1549      Chief Complaint  Patient presents with  . Abdominal Pain    (Consider location/radiation/quality/duration/timing/severity/associated sxs/prior treatment) Patient is a 41 y.o. female presenting with cramps. The history is provided by the patient.  Abdominal Cramping The primary symptoms of the illness include abdominal pain and nausea. The primary symptoms of the illness do not include fever, vomiting, diarrhea, dysuria, vaginal discharge or vaginal bleeding. The current episode started more than 2 days ago. The onset of the illness was gradual. The problem has been gradually worsening.  The patient states that she believes she is currently not pregnant. The patient has not had a change in bowel habit. Additional symptoms associated with the illness include anorexia. Symptoms associated with the illness do not include chills, constipation, urgency, hematuria, frequency or back pain.  Pt states pain has been constant, in the right abdomen, worsened with movement and palpation. States she felt like not eating all day today. Denies fever, vomiting, diarrhea. States stools are small and hard. Denies urinary or vaginal symptoms.   Past Medical History  Diagnosis Date  . Depression   . Allergy   . Asthma   . Extreme insulin resistance type A     No past surgical history on file.  No family history on file.  History  Substance Use Topics  . Smoking status: Never Smoker   . Smokeless tobacco: Not on file  . Alcohol Use: Yes    OB History    Grav Para Term Preterm Abortions TAB SAB Ect Mult Living                  Review of Systems  Constitutional: Negative for fever and chills.  HENT: Negative.   Eyes: Negative.   Respiratory: Negative.   Cardiovascular: Negative.   Gastrointestinal: Positive for nausea, abdominal pain and anorexia. Negative for  vomiting, diarrhea and constipation.  Genitourinary: Negative for dysuria, urgency, frequency, hematuria, vaginal bleeding and vaginal discharge.  Musculoskeletal: Negative for back pain.  Skin: Negative.   Neurological: Negative.   Psychiatric/Behavioral: Negative.     Allergies  Iohexol and Penicillins  Home Medications   Current Outpatient Rx  Name Route Sig Dispense Refill  . LEVOCETIRIZINE DIHYDROCHLORIDE 5 MG PO TABS Oral Take 5 mg by mouth every evening.      Marland Kitchen METFORMIN HCL 500 MG PO TABS Oral Take 500 mg by mouth daily with breakfast.      . MINOCYCLINE HCL 100 MG PO CAPS Oral Take 100 mg by mouth daily.      Marland Kitchen ONE-DAILY MULTI VITAMINS PO TABS Oral Take 1 tablet by mouth daily.      . QC NASAL RELIEF SINUS NA Nasal Place 2 sprays into the nose daily.    Marland Kitchen PHENTERMINE HCL 37.5 MG PO CAPS Oral Take 37.5 mg by mouth daily.    Marland Kitchen SPIRONOLACTONE 100 MG PO TABS Oral Take 50 mg by mouth 2 (two) times daily.    Marland Kitchen ZALEPLON 10 MG PO CAPS Oral Take 10 mg by mouth at bedtime as needed. For sleep     . LEVONORGESTREL 20 MCG/24HR IU IUD Intrauterine 1 each by Intrauterine route once.        BP 112/76  Pulse 86  Temp(Src) 98 F (36.7 C) (Oral)  SpO2 100%  LMP 08/01/2011  Physical Exam  Constitutional: She is  oriented to person, place, and time. She appears well-developed and well-nourished. No distress.  HENT:  Head: Normocephalic and atraumatic.  Eyes: Conjunctivae are normal.  Neck: Neck supple.  Cardiovascular: Normal rate and regular rhythm.   Pulmonary/Chest: Effort normal and breath sounds normal. No respiratory distress.  Abdominal: Soft. Bowel sounds are normal.       Right LQ, McBurney's point tenderness. Some guarding. No rebound tenderness, no CVA tenderness  Musculoskeletal: Normal range of motion.  Neurological: She is alert and oriented to person, place, and time.  Skin: Skin is warm and dry.  Psychiatric: She has a normal mood and affect.    ED Course    Procedures (including critical care time)  Pt with RLQ abdominal pain, sent from UC for rule out. Pain for 3 days, constant, worsening. Hx of kidney stones and polycytic ovarian syndrome. Pt denies any vaginal symptoms, any urinary symptoms. Does not feel like a kindney stone to her. At this time, DDx is appendicitis, colitis, kidney stone, ovarian cyst. Pt afebrile. Labs pending. Will get CT for initial evaluation.   Results for orders placed during the hospital encounter of 08/15/11  CBC      Component Value Range   WBC 8.0  4.0 - 10.5 (K/uL)   RBC 4.56  3.87 - 5.11 (MIL/uL)   Hemoglobin 13.6  12.0 - 15.0 (g/dL)   HCT 16.1  09.6 - 04.5 (%)   MCV 86.4  78.0 - 100.0 (fL)   MCH 29.8  26.0 - 34.0 (pg)   MCHC 34.5  30.0 - 36.0 (g/dL)   RDW 40.9  81.1 - 91.4 (%)   Platelets 233  150 - 400 (K/uL)  DIFFERENTIAL      Component Value Range   Neutrophils Relative 65  43 - 77 (%)   Neutro Abs 5.2  1.7 - 7.7 (K/uL)   Lymphocytes Relative 24  12 - 46 (%)   Lymphs Abs 1.9  0.7 - 4.0 (K/uL)   Monocytes Relative 10  3 - 12 (%)   Monocytes Absolute 0.8  0.1 - 1.0 (K/uL)   Eosinophils Relative 1  0 - 5 (%)   Eosinophils Absolute 0.1  0.0 - 0.7 (K/uL)   Basophils Relative 0  0 - 1 (%)   Basophils Absolute 0.0  0.0 - 0.1 (K/uL)  BASIC METABOLIC PANEL      Component Value Range   Sodium 139  135 - 145 (mEq/L)   Potassium 4.1  3.5 - 5.1 (mEq/L)   Chloride 105  96 - 112 (mEq/L)   CO2 26  19 - 32 (mEq/L)   Glucose, Bld 92  70 - 99 (mg/dL)   BUN 11  6 - 23 (mg/dL)   Creatinine, Ser 7.82  0.50 - 1.10 (mg/dL)   Calcium 9.3  8.4 - 95.6 (mg/dL)   GFR calc non Af Amer >90  >90 (mL/min)   GFR calc Af Amer >90  >90 (mL/min)  URINALYSIS, ROUTINE W REFLEX MICROSCOPIC      Component Value Range   Color, Urine YELLOW  YELLOW    APPearance CLEAR  CLEAR    Specific Gravity, Urine 1.020  1.005 - 1.030    pH 5.5  5.0 - 8.0    Glucose, UA NEGATIVE  NEGATIVE (mg/dL)   Hgb urine dipstick NEGATIVE  NEGATIVE     Bilirubin Urine NEGATIVE  NEGATIVE    Ketones, ur NEGATIVE  NEGATIVE (mg/dL)   Protein, ur NEGATIVE  NEGATIVE (mg/dL)   Urobilinogen, UA  0.2  0.0 - 1.0 (mg/dL)   Nitrite NEGATIVE  NEGATIVE    Leukocytes, UA MODERATE (*) NEGATIVE   PREGNANCY, URINE      Component Value Range   Preg Test, Ur NEGATIVE  NEGATIVE   URINE MICROSCOPIC-ADD ON      Component Value Range   Squamous Epithelial / LPF MANY (*) RARE    WBC, UA 0-2  <3 (WBC/hpf)   Bacteria, UA RARE  RARE    Urine-Other MUCOUS PRESENT     Ct Abdomen Pelvis Wo Contrast  08/15/2011  *RADIOLOGY REPORT*  Clinical Data: Right lower quadrant and right upper quadrant pain.  CT ABDOMEN AND PELVIS WITHOUT CONTRAST  Technique:  Multidetector CT imaging of the abdomen and pelvis was performed following the standard protocol without intravenous contrast.  Comparison: 03/13/2007.  Findings: Lung bases are clear. Unenhanced CT was performed per clinician order.  Lack of IV contrast limits sensitivity and specificity, especially for evaluation of abdominal/pelvic solid viscera.  Left kidney demonstrates 2 small nonobstructing renal collecting system calculi.  The largest measures 4 mm.  No ureteral calculi. Right hepatic lobe shows 2 low density lesions which grossly appear unchanged compared to the prior contrast enhanced CT.  Otherwise the solid abdominal viscera appear normal.  Small and large bowel appear within normal limits. Normal appendix. Small but prominent lymph nodes are present in the periaortic region and at the root of the small bowel mesentery, similar to prior examination.  None of these meet criteria for pathologic enlargement.  The small amount of physiologic free fluid is present in the pelvis.  Urinary bladder appears normal.  Uterus and adnexa normal.  IMPRESSION: 1.  No acute abnormality. 2.  Nonobstructing left nephrolithiasis. 3.  Stable prominent retroperitoneal and mesenteric lymph nodes. 4.  IUD within the uterus. 5.  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  Original Report Authenticated By: Andreas Newport, M.D.    7:17 PM CT with no acute findings. Appendix normal. Discussed findings with pt. Recommeded pelvic exam and [place] to r/o ovarian abnormalities. Pt states she is actually feeling better and refusing exam, stating she will follow up with your doctor tomorrow if pain continues. WIll d/c home with close follow up. She appears non toxic. VS normal. Abdomen soft, mild tenderness in RLQ, no guarding.   No diagnosis found.    MDM          Lottie Mussel, PA 08/15/11 1924

## 2011-08-15 NOTE — ED Notes (Signed)
X 3 days rt. Lower abd pain; sent from ucc with concerns of appendicitis.

## 2011-08-15 NOTE — ED Notes (Signed)
Pt finished contrast dye, CT notified

## 2011-08-15 NOTE — ED Notes (Addendum)
Pt c/o nausea and RLQ pain x3 days. Pt states pain has cont. Pain does not radiate into back. Pt noticed lack of appetite today. Denies v/d. Abdomen is soft and tender in RLQ.

## 2011-08-15 NOTE — ED Notes (Signed)
Pt stated that she does not want any medication for pain or nausea.

## 2011-08-15 NOTE — ED Notes (Signed)
Report called to [facility] in ED and transported by [facility] shuttle

## 2011-08-15 NOTE — ED Notes (Signed)
Pt has RLQ pain for 3 days, nauseated and is concerned with her appendix

## 2011-08-15 NOTE — Discharge Instructions (Signed)
Your lab work is normal today. Your CT did not show any signs of appendicitis or other emergent condition. It did however show a spot on your liver which was compared to the previuos CT you had and it has not changed/worsened. It also showed prominent lymph nodes  In your abdomen which also were seen on the old CT scan and are not new. Make sure you follow up with your doctor if symptoms are not improving in the next 1-2 days. Return here if worsening. zofran as prescribed for nausea.   Abdominal Pain Abdominal pain can be caused by many things. Your caregiver decides the seriousness of your pain by an examination and possibly blood tests and X-rays. Many cases can be observed and treated at home. Most abdominal pain is not caused by a disease and will probably improve without treatment. However, in many cases, more time must pass before a clear cause of the pain can be found. Before that point, it may not be known if you need more testing, or if hospitalization or surgery is needed. HOME CARE INSTRUCTIONS   Do not take laxatives unless directed by your caregiver.   Take pain medicine only as directed by your caregiver.   Only take over-the-counter or prescription medicines for pain, discomfort, or fever as directed by your caregiver.   Try a clear liquid diet (broth, tea, or water) for as long as directed by your caregiver. Slowly move to a bland diet as tolerated.  SEEK IMMEDIATE MEDICAL CARE IF:   The pain does not go away.   You have a fever.   You keep throwing up (vomiting).   The pain is felt only in portions of the abdomen. Pain in the right side could possibly be appendicitis. In an adult, pain in the left lower portion of the abdomen could be colitis or diverticulitis.   You pass bloody or black tarry stools.  MAKE SURE YOU:   Understand these instructions.   Will watch your condition.   Will get help right away if you are not doing well or get worse.  Document Released:  02/10/2005 Document Revised: 04/22/2011 Document Reviewed: 12/20/2007 [facility] Patient Information 2012 [place], [place].

## 2011-08-15 NOTE — Discharge Instructions (Signed)
Transferred to the ED

## 2011-08-15 NOTE — ED Provider Notes (Signed)
History     CSN: [id number]  Arrival date & time 08/15/11  1315   First MD Initiated Contact with Patient 08/15/11 1326      Chief Complaint  Patient presents with  . Abdominal Pain    (Consider location/radiation/quality/duration/timing/severity/associated sxs/prior treatment) HPI Comments: Jordan Mayer presents for evaluation of right lower quadrant pain, over the last 3 days. She reports worsening of the pain today. Nothing exacerbates or palliates the pain. She reports nausea, but no vomiting. She is tolerating by mouth, but has not really tried to eat or drink anything today. She reports some constipation with small, hard stool this morning. She denies any fever [facility] reveals tenderness over McBurney's point, but no rebound, and no guarding. She is afebrile. Patient is a 41 y.o. female presenting with abdominal pain. The history is provided by the patient.  Abdominal Pain The primary symptoms of the illness include abdominal pain and nausea. The primary symptoms of the illness do not include vomiting or diarrhea. The onset of the illness was sudden. The problem has been gradually worsening.  The abdominal pain is located in the RLQ. The abdominal pain does not radiate. The abdominal pain is relieved by nothing.    Past Medical History  Diagnosis Date  . Depression   . Allergy   . Asthma   . Extreme insulin resistance type A     History reviewed. No pertinent past surgical history.  History reviewed. No pertinent family history.  History  Substance Use Topics  . Smoking status: Never Smoker   . Smokeless tobacco: Not on file  . Alcohol Use: Yes    OB History    Grav Para Term Preterm Abortions TAB SAB Ect Mult Living                  Review of Systems  Constitutional: Negative.   HENT: Negative.   Eyes: Negative.   Respiratory: Negative.   Cardiovascular: Negative.   Gastrointestinal: Positive for nausea and abdominal pain. Negative for  vomiting and diarrhea.  Genitourinary: Negative.   Musculoskeletal: Negative.   Skin: Negative.   Neurological: Negative.     Allergies  Iohexol and Penicillins  Home Medications   Current Outpatient Rx  Name Route Sig Dispense Refill  . ALBUTEROL SULFATE HFA 108 (90 BASE) MCG/ACT IN AERS Inhalation Inhale 2 puffs into the lungs every 6 (six) hours as needed.      . AMPHETAMINE-DEXTROAMPHETAMINE 10 MG PO TABS Oral Take 10 mg by mouth daily.      Marland Kitchen CEFDINIR 300 MG PO CAPS Oral Take 300 mg by mouth 2 (two) times daily.      Marland Kitchen LEVOCETIRIZINE DIHYDROCHLORIDE 5 MG PO TABS Oral Take 5 mg by mouth every evening.      Marland Kitchen LEVONORGESTREL 20 MCG/24HR IU IUD Intrauterine 1 each by Intrauterine route once.      . METFORMIN HCL 500 MG PO TABS Oral Take 500 mg by mouth daily with breakfast.      . MINOCYCLINE HCL 100 MG PO CAPS Oral Take 100 mg by mouth daily.      . MOMETASONE FUROATE 50 MCG/ACT NA SUSP Nasal 2 sprays by Nasal route daily.      Marland Kitchen ONE-DAILY MULTI VITAMINS PO TABS Oral Take 1 tablet by mouth daily.      Marland Kitchen ZALEPLON 10 MG PO CAPS Oral Take 10 mg by mouth at bedtime.        BP 131/79  Pulse 104  Temp(Src) 99.2 F (37.3 C) (Oral)  Resp 16  SpO2 100%  LMP 08/01/2011  Physical Exam  Nursing note and vitals reviewed. Constitutional: She is oriented to person, place, and time. She appears well-developed and well-nourished.  HENT:  Head: Normocephalic and atraumatic.  Eyes: EOM are normal.  Neck: Normal range of motion.  Pulmonary/Chest: Effort normal.  Abdominal: Soft. Normal appearance and bowel sounds are normal. There is tenderness in the right lower quadrant. There is tenderness at McBurney's point. There is no rebound and no guarding.  Musculoskeletal: Normal range of motion.  Neurological: She is alert and oriented to person, place, and time.  Skin: Skin is warm and dry.  Psychiatric: Her behavior is normal.    ED Course  Procedures (including critical care  time)  Labs Reviewed - No data to display No results found.   1. Abdominal pain       MDM  Transferred to ED for further evaluation of abdominal pain.         Renaee Munda, MD 08/15/11 [phone]

## 2011-08-15 NOTE — ED Notes (Signed)
Pt has contrast dye at bedside and has been instructed by CT tech.

## 2011-08-16 NOTE — ED Provider Notes (Signed)
Medical screening examination/treatment/procedure(s) were conducted as a shared visit with non-physician practitioner(s) and myself.  I personally evaluated the patient during the encounter  Rory Xiang, MD 08/16/11 0000 

## 2011-09-16 ENCOUNTER — Ambulatory Visit: Payer: 59 | Admitting: Internal Medicine

## 2011-12-20 ENCOUNTER — Ambulatory Visit (INDEPENDENT_AMBULATORY_CARE_PROVIDER_SITE_OTHER): Payer: BC Managed Care – PPO | Admitting: Internal Medicine

## 2011-12-20 ENCOUNTER — Encounter: Payer: Self-pay | Admitting: Internal Medicine

## 2011-12-20 VITALS — BP 112/74 | HR 114 | Temp 98.6°F | Wt 174.0 lb

## 2011-12-20 DIAGNOSIS — Z87442 Personal history of urinary calculi: Secondary | ICD-10-CM

## 2011-12-20 DIAGNOSIS — R42 Dizziness and giddiness: Secondary | ICD-10-CM

## 2011-12-20 DIAGNOSIS — G43909 Migraine, unspecified, not intractable, without status migrainosus: Secondary | ICD-10-CM

## 2011-12-20 MED ORDER — MECLIZINE HCL 25 MG PO TABS: 25.0000 mg | ORAL_TABLET | Freq: Three times a day (TID) | ORAL | Status: AC | PRN

## 2011-12-20 NOTE — Patient Instructions (Signed)
I think you have vertigo  Form inner ear problem because of the way the sx came on.   Can take antivert maximum 3 x per day for symptom relief    But will not make you better earlier.   Avoid driving and ladders until better Should be improved in the next 72 hours  Fu if ongoing more than 1-2 weeks or if needed.   HAve Dr Kinnie Scales send [place] a copy of you labs as you get them  Many meds can cause sx  Also .  The neck and back pain seem to be musculoskeletal and not seemingly.  related to the vertigo .

## 2011-12-23 ENCOUNTER — Encounter: Payer: Self-pay | Admitting: Internal Medicine

## 2011-12-23 DIAGNOSIS — R42 Dizziness and giddiness: Secondary | ICD-10-CM | POA: Insufficient documentation

## 2011-12-23 NOTE — Progress Notes (Signed)
Subjective:    Patient ID: Jordan Mayer, female    DOB: 1970/08/28, 41 y.o.   MRN: [id number]  HPI Patient comes in today for SDA for  new problem evaluation. 1-2 days of vertigo . Last visit with me was about 2 years ago  Has been getting ongoing care from her specialist [facility] for weight loss and PCO meds? And Dr Barnetta Chapel for allergies . She on going  Hx of migraines and right arm tingling  And neck pain on right side  .  However onset over the weekend with having a migraine in the middle of the night and then the next am noted spinning vertigo and nausea without vision hearing vomiting but nausea no falling weakness . Can walk and drive but careful not to move head.   No injury syncope. Played reg tennis without problem the weekend this started. Is on a number of meds   But denies changes in these meds  ROS no fever  Some allergy but no face pain or sinus infection.  Bleeding bruising Outpatient Encounter Prescriptions as of 12/20/2011  Medication Sig Dispense Refill  . fluticasone (FLONASE) 50 MCG/ACT nasal spray Place 1 spray into the nose daily.      Marland Kitchen levocetirizine (XYZAL) 5 MG tablet Take 5 mg by mouth every evening.        . metFORMIN (GLUCOPHAGE) 500 MG tablet Take 500 mg by mouth daily with breakfast.        . minocycline (MINOCIN,DYNACIN) 100 MG capsule Take 100 mg by mouth daily.        . Multiple Vitamin (MULTIVITAMIN) tablet Take 1 tablet by mouth daily.        . phentermine 37.5 MG capsule Take 37.5 mg by mouth daily.      Marland Kitchen spironolactone (ALDACTONE) 100 MG tablet Take 50 mg by mouth 2 (two) times daily.      . zaleplon (SONATA) 10 MG capsule Take 10 mg by mouth at bedtime as needed. For sleep       . meclizine (ANTIVERT) 25 MG tablet Take 1 tablet (25 mg total) by mouth 3 (three) times daily as needed for dizziness or nausea.  30 tablet  0  . DISCONTD: levonorgestrel (MIRENA) 20 MCG/24HR IUD 1 each by Intrauterine route once.        Marland Kitchen DISCONTD: Oxymetazoline HCl (QC NASAL  RELIEF SINUS NA) Place 2 sprays into the nose daily.        Past history family history social history reviewed in the electronic medical record Had renal stones  This year.  Review of Systems See above     Objective:   Physical Exam BP 112/74  Pulse 114  Temp 98.6 F (37 C) (Oral)  Wt 174 lb (78.926 kg)  SpO2 97%  LMP 12/08/2011 Wt Readings from Last 3 Encounters:  12/20/11 174 lb (78.926 kg)  12/19/09 178 lb (80.74 kg)  07/01/09 178 lb (80.74 kg)   WDWN in nad   Oriented x 3 and no noted deficits in memory, attention, and speech. Walks carefully to not move head and then balance is good . HEENT: Normocephalic ;atraumatic , Eyes;  PERRL, EOMs  Full, lids and conjunctiva clear, no sig nystagmus except very end gaze,Ears: no deformities, canals nl, TM landmarks normal, Nose: no deformity or discharge mild congestion  Mouth : OP clear without lesion or edema . NECK no masses of bruits Chest:  Clear to A&P without wheezes rales or rhonchi CV:  S1-S2 no gallops  or murmurs peripheral perfusion is normal No clubbing cyanosis or edema Neuro; oriented x 3 cn 3-12 seems ok .  Neg rhomberg slightly unsteady but maintains balance.  f to n ok  No tremor.    Assessment & Plan:  Acute vertigo peripheral appearing  No other cns sx .  Except chronic tingling in hands that is prob radicular or compression issue  Done seem related to above  Able to play vigorous tennis .   On many meds than can alter metabolic profile . Pt assures that her labs are being monitored  By other providers and have been ok so not doing labs today.   i have none of these records or notes as to the care management of this patient.     Expectant management.Call with alarm features    . If  persistent or progressive recheck  For more eval consider pt etc.  Can use antivert prn in the interim  But will now change course of the vertigo. Reported hx of pco  And on some type of weight loss meds and program  Antibiotic and  metformin.

## 2012-04-10 ENCOUNTER — Other Ambulatory Visit: Payer: Self-pay | Admitting: Neurosurgery

## 2012-04-10 DIAGNOSIS — M25511 Pain in right shoulder: Secondary | ICD-10-CM

## 2012-04-21 ENCOUNTER — Ambulatory Visit
Admission: RE | Admit: 2012-04-21 | Discharge: 2012-04-21 | Disposition: A | Discharge status: Home / Self Care | Payer: BC Managed Care – PPO | Source: Ambulatory Visit | Attending: Neurosurgery | Admitting: Neurosurgery

## 2012-04-21 DIAGNOSIS — M25511 Pain in right shoulder: Secondary | ICD-10-CM

## 2012-09-29 ENCOUNTER — Ambulatory Visit: Payer: BC Managed Care – PPO | Admitting: Internal Medicine

## 2012-12-30 ENCOUNTER — Emergency Department (INDEPENDENT_AMBULATORY_CARE_PROVIDER_SITE_OTHER): Payer: BC Managed Care – PPO

## 2012-12-30 ENCOUNTER — Emergency Department (HOSPITAL_COMMUNITY)
Admission: EM | Admit: 2012-12-30 | Discharge: 2012-12-30 | Disposition: A | Discharge status: Home / Self Care | Payer: BC Managed Care – PPO | Source: Home / Self Care | Attending: Family Medicine | Admitting: Family Medicine

## 2012-12-30 ENCOUNTER — Encounter (HOSPITAL_COMMUNITY): Payer: Self-pay | Admitting: *Deleted

## 2012-12-30 DIAGNOSIS — S63599A Other specified sprain of unspecified wrist, initial encounter: Secondary | ICD-10-CM

## 2012-12-30 DIAGNOSIS — S66819A Strain of other specified muscles, fascia and tendons at wrist and hand level, unspecified hand, initial encounter: Secondary | ICD-10-CM

## 2012-12-30 NOTE — ED Provider Notes (Signed)
CSN: [id number]     Arrival date & time 12/30/12  1522 History     First MD Initiated Contact with Patient 12/30/12 1534     Chief Complaint  Patient presents with  . Wrist Pain   (Consider location/radiation/quality/duration/timing/severity/associated sxs/prior Treatment) Patient is a 42 y.o. female presenting with wrist pain. The history is provided by the patient.  Wrist Pain This is a new problem. The current episode started more than 1 week ago. The problem has been gradually worsening (hurt 1 week ago , worse today after playing tennis .).    Past Medical History  Diagnosis Date  . Depression   . Allergy   . Asthma   . Extreme insulin resistance type A    History reviewed. No pertinent past surgical history. History reviewed. No pertinent family history. History  Substance Use Topics  . Smoking status: Never Smoker   . Smokeless tobacco: Not on file  . Alcohol Use: Yes   OB History   Grav Para Term Preterm Abortions TAB SAB Ect Mult Living                 Review of Systems  Constitutional: Negative.   Musculoskeletal: Positive for joint swelling. Negative for back pain and gait problem.  Skin: Negative.     Allergies  Iohexol and Penicillins  Home Medications   Current Outpatient Rx  Name  Route  Sig  Dispense  Refill  . fluticasone (FLONASE) 50 MCG/ACT nasal spray   Nasal   Place 1 spray into the nose daily.         Marland Kitchen levocetirizine (XYZAL) 5 MG tablet   Oral   Take 5 mg by mouth every evening.           . metFORMIN (GLUCOPHAGE) 500 MG tablet   Oral   Take 500 mg by mouth daily with breakfast.           . minocycline (MINOCIN,DYNACIN) 100 MG capsule   Oral   Take 100 mg by mouth daily.           . Multiple Vitamin (MULTIVITAMIN) tablet   Oral   Take 1 tablet by mouth daily.           . phentermine 37.5 MG capsule   Oral   Take 37.5 mg by mouth daily.         Marland Kitchen spironolactone (ALDACTONE) 100 MG tablet   Oral   Take 50 mg by  mouth 2 (two) times daily.         . zaleplon (SONATA) 10 MG capsule   Oral   Take 10 mg by mouth at bedtime as needed. For sleep           BP 113/82  Pulse 98  Temp(Src) 98.2 F (36.8 C) (Oral)  Resp 16  SpO2 98%  LMP 12/23/2012 Physical Exam  Nursing note and vitals reviewed. Constitutional: She is oriented to person, place, and time. She appears well-developed and well-nourished.  Musculoskeletal: She exhibits tenderness.       Left wrist: She exhibits decreased range of motion, tenderness, bony tenderness and swelling. She exhibits no deformity.  Neurological: She is alert and oriented to person, place, and time.  Skin: Skin is warm and dry.    ED Course   Procedures (including critical care time)  Labs Reviewed - No data to display Dg Wrist Complete Left  12/30/2012   *RADIOLOGY REPORT*  Clinical Data: Pain and swelling post trauma  LEFT  WRIST - COMPLETE 3+ VIEW  Comparison: None.  Findings: Frontal, oblique, lateral, and ulnar deviation scaphoid images were obtained. There is no fracture or dislocation.  Joint spaces appear intact.  No erosive change.  IMPRESSION: No abnormality noted.   Original Report Authenticated By: Bretta Bang, M.D.   1. Wrist sprain and strain, left, initial encounter     MDM  X-rays reviewed and report per radiologist.   Linna Hoff, MD 12/30/12 [phone]

## 2012-12-30 NOTE — ED Notes (Signed)
Pt  Reports  Pain  l  Wrist       -   She  Reports  She  Injured  The  Wrist  About  1  Week  Ago  When  She  Felled      She reports  She  Played  Tennis  Today  And  aggrevated  It  She  Reports  Pain  /  Swelling of the  l  Wrist

## 2012-12-30 NOTE — ED Notes (Signed)
Universal l  Wrist  applied  Pt  Tolerated  Well

## 2013-07-27 ENCOUNTER — Ambulatory Visit (INDEPENDENT_AMBULATORY_CARE_PROVIDER_SITE_OTHER): Payer: BC Managed Care – PPO | Admitting: Family Medicine

## 2013-07-27 ENCOUNTER — Ambulatory Visit (INDEPENDENT_AMBULATORY_CARE_PROVIDER_SITE_OTHER)
Admission: RE | Admit: 2013-07-27 | Discharge: 2013-07-27 | Disposition: A | Discharge status: Home / Self Care | Payer: BC Managed Care – PPO | Source: Ambulatory Visit | Attending: Family Medicine | Admitting: Family Medicine

## 2013-07-27 ENCOUNTER — Encounter: Payer: Self-pay | Admitting: Family Medicine

## 2013-07-27 VITALS — BP 114/80 | HR 86 | Temp 98.8°F | Resp 18 | Ht 65.0 in | Wt 199.0 lb

## 2013-07-27 DIAGNOSIS — R05 Cough: Secondary | ICD-10-CM

## 2013-07-27 DIAGNOSIS — R5383 Other fatigue: Secondary | ICD-10-CM

## 2013-07-27 DIAGNOSIS — R059 Cough, unspecified: Secondary | ICD-10-CM

## 2013-07-27 DIAGNOSIS — R5381 Other malaise: Secondary | ICD-10-CM

## 2013-07-27 DIAGNOSIS — R509 Fever, unspecified: Secondary | ICD-10-CM

## 2013-07-27 DIAGNOSIS — B349 Viral infection, unspecified: Secondary | ICD-10-CM

## 2013-07-27 DIAGNOSIS — B9789 Other viral agents as the cause of diseases classified elsewhere: Secondary | ICD-10-CM

## 2013-07-27 LAB — CBC WITH DIFFERENTIAL/PLATELET
BASOS PCT: 0.5 % (ref 0.0–3.0)
Basophils Absolute: 0 10*3/uL (ref 0.0–0.1)
EOS PCT: 1.3 % (ref 0.0–5.0)
Eosinophils Absolute: 0.1 10*3/uL (ref 0.0–0.7)
HEMATOCRIT: 41.7 % (ref 36.0–46.0)
HEMOGLOBIN: 14.1 g/dL (ref 12.0–15.0)
LYMPHS ABS: 1.5 10*3/uL (ref 0.7–4.0)
Lymphocytes Relative: 27.1 % (ref 12.0–46.0)
MCHC: 33.8 g/dL (ref 30.0–36.0)
MCV: 85.1 fl (ref 78.0–100.0)
MONO ABS: 0.7 10*3/uL (ref 0.1–1.0)
MONOS PCT: 12.5 % — AB (ref 3.0–12.0)
Neutro Abs: 3.3 10*3/uL (ref 1.4–7.7)
Neutrophils Relative %: 58.6 % (ref 43.0–77.0)
Platelets: 241 10*3/uL (ref 150.0–400.0)
RBC: 4.9 Mil/uL (ref 3.87–5.11)
RDW: 12.8 % (ref 11.5–14.6)
WBC: 5.7 10*3/uL (ref 4.5–10.5)

## 2013-07-27 LAB — COMPREHENSIVE METABOLIC PANEL
ALBUMIN: 4.4 g/dL (ref 3.5–5.2)
ALK PHOS: 72 U/L (ref 39–117)
ALT: 40 U/L — ABNORMAL HIGH (ref 0–35)
AST: 26 U/L (ref 0–37)
BILIRUBIN TOTAL: 0.4 mg/dL (ref 0.3–1.2)
BUN: 14 mg/dL (ref 6–23)
CO2: 27 meq/L (ref 19–32)
Calcium: 9.9 mg/dL (ref 8.4–10.5)
Chloride: 103 mEq/L (ref 96–112)
Creatinine, Ser: 0.9 mg/dL (ref 0.4–1.2)
GFR: 69.07 mL/min (ref >60.00)
GLUCOSE: 94 mg/dL (ref 70–99)
Potassium: 4.1 mEq/L (ref 3.5–5.1)
Sodium: 137 mEq/L (ref 135–145)
TOTAL PROTEIN: 7.6 g/dL (ref 6.0–8.3)

## 2013-07-27 MED ORDER — HYDROCODONE-HOMATROPINE 5-1.5 MG/5ML PO SYRP: ORAL_SOLUTION | ORAL | Status: DC

## 2013-07-27 NOTE — Progress Notes (Signed)
OFFICE NOTE  07/27/2013  CC:  Chief Complaint  Patient presents with  . Fatigue    been in bed for 6 days  . Back Pain  . URI    majority has passed  . Cough    productive cough     HPI: Patient is a 43 y.o. Caucasian female who is here for cough. Started about 1 week ago with pain in her upper back diffusely ("sore"), progressed to generalized body aches, lots of nasal congestion/mucous, progressed to lots of coughing, fatigue, some waves of nausea w/out vomiting, subjective f/c.  Still having all these sx's to a moderate degree--not getting better like she expected. Her cough is deep, occasionally productive. ST but she thinks it is more from coughing.  No rash.   +paranasal sinus pain/pressure.  No diarrhea. Appetite is down some.  Dayquil, pepto, sudafed, nyquil, delsym, all used lately.  Most recently ate french onion soup 1 hour ago, water to drink.  ROS: no chest pain, no SOB, no dysuria, no hematuria, no urinary urgency. Urinary frequency appropriate for increased fluid intake lately.  Pertinent PMH:  Past Medical History  Diagnosis Date  . Depression   . Allergy   . Asthma   . Extreme insulin resistance type A   PCOS-managed by her GYN. History reviewed. No pertinent past surgical history.  MEDS: not taking phentermine or metformin anymore for at least 53mo  Outpatient Prescriptions Prior to Visit  Medication Sig Dispense Refill  . fluticasone (FLONASE) 50 MCG/ACT nasal spray Place 1 spray into the nose daily.      . minocycline (MINOCIN,DYNACIN) 100 MG capsule Take 100 mg by mouth daily.        . Multiple Vitamin (MULTIVITAMIN) tablet Take 1 tablet by mouth daily.        Marland Kitchen spironolactone (ALDACTONE) 100 MG tablet Take 50 mg by mouth 2 (two) times daily.      . zaleplon (SONATA) 10 MG capsule Take 10 mg by mouth at bedtime as needed. For sleep       . levocetirizine (XYZAL) 5 MG tablet Take 5 mg by mouth every evening.        . metFORMIN (GLUCOPHAGE) 500 MG  tablet Take 500 mg by mouth daily with breakfast.        . phentermine 37.5 MG capsule Take 37.5 mg by mouth daily.       No facility-administered medications prior to visit.    PE: Blood pressure 114/80, pulse 86, temperature 98.8 F (37.1 C), temperature source Temporal, resp. rate 18, height 5\' 5"  (1.651 m), weight 199 lb (90.266 kg), SpO2 100.00%.  Pt examined with Jacklynn Ganong, CMA in room. Gen: Alert, mildly tired-appearing but NAD  Patient is oriented to person, place, time, and situation. AFFECT: pleasant, lucid thought and speech. HEENT: eyes without injection, drainage, or swelling.  Ears: EACs clear, TMs with normal light reflex and landmarks.  Nose: no rhinorrhea or swelling or redness of nasal mucosa.  No purulent d/c.  No paranasal sinus TTP.  No facial swelling.  Throat and mouth without focal lesion.  No pharyngial swelling, erythema, or exudate.   Neck: supple, no LAD or TM.  Upper back soft tissues mildly TTP, less tender as you move further down mid back to lower back.  NO CVA tenderness. LUNGS: CTA bilat, nonlabored resps.   CV: RRR, no m/r/g. ABD: soft, NT/ND, BS normal. EXT: no c/c/e SKIN: no rash  LAB: none  IMPRESSION AND PLAN:  Prolonged viral  syndrome/ILI, no hard signs of bacterial complication. Will check CBC, CMET, and CXR : all ordered today. Hycodan susp 1-2 tsp q6h prn, #126ml---rx printed and handed to pt to fill if she wants more intense symptom control but she wasn't sure if she was going to fill this rx or not. Continue with rest, fluids.  An After Visit Summary was printed and given to the patient.  FOLLOW UP: prn--To be determined based on results of pending workup.

## 2013-07-27 NOTE — Progress Notes (Signed)
Pre visit review using our clinic review tool, if applicable. No additional management support is needed unless otherwise documented below in the visit note. 

## 2013-07-30 ENCOUNTER — Telehealth: Payer: Self-pay | Admitting: Family Medicine

## 2013-07-30 MED ORDER — PREDNISONE 20 MG PO TABS
ORAL_TABLET | ORAL | Status: DC
Start: 2013-07-30 — End: 2014-01-16

## 2013-07-30 MED ORDER — AZITHROMYCIN 250 MG PO TABS: ORAL_TABLET | ORAL | Status: DC

## 2013-07-30 NOTE — Telephone Encounter (Signed)
Prednisone and z-pack eRx'd to her pharmacy.

## 2013-07-30 NOTE — Telephone Encounter (Signed)
Patient called stating that she is still not feeling much better.  She wanted to see about getting a steroid and possibly an antibiotic.  Please advise.

## 2013-07-30 NOTE — Telephone Encounter (Signed)
Patient aware of medications.

## 2013-09-14 HISTORY — PX: LAPAROSCOPIC GASTRIC SLEEVE RESECTION: SHX5895

## 2013-10-03 ENCOUNTER — Telehealth: Payer: Self-pay | Admitting: Internal Medicine

## 2013-10-03 DIAGNOSIS — R52 Pain, unspecified: Secondary | ICD-10-CM

## 2013-10-03 DIAGNOSIS — R0989 Other specified symptoms and signs involving the circulatory and respiratory systems: Secondary | ICD-10-CM

## 2013-10-03 NOTE — Telephone Encounter (Signed)
Pt needs a referral to see dr Adele Barthel for laser ablation on legs. Pt has to switch from dr featherston- vein due to Ins. Pt has bcbs. Pt  last seen in 2013. Pt will fax copy of new ins card

## 2013-10-03 NOTE — Telephone Encounter (Signed)
i havent seen her for over a year  Cant she make her own appt  With them?   OK to try to do referral but uncertain if will go though without a recnet OV

## 2013-10-09 NOTE — Telephone Encounter (Signed)
Pt returning your call. pls call. Pt available now.

## 2013-10-09 NOTE — Telephone Encounter (Signed)
Left a message at the below listed number for the pt to return my call. 

## 2013-10-09 NOTE — Telephone Encounter (Signed)
Pt states she is having painful veins, not necessarily vericose. Pt states she found out that her veins do not drain in the right direction. And it is called laser ablation. Pt states its the same analogy as mitro valve prolapse but in the legs.

## 2013-10-10 NOTE — Telephone Encounter (Signed)
Order placed in the system. 

## 2013-10-26 ENCOUNTER — Other Ambulatory Visit: Payer: Self-pay | Admitting: *Deleted

## 2013-10-26 DIAGNOSIS — I83893 Varicose veins of bilateral lower extremities with other complications: Secondary | ICD-10-CM

## 2013-12-12 ENCOUNTER — Encounter: Payer: Self-pay | Admitting: Vascular Surgery

## 2013-12-13 ENCOUNTER — Ambulatory Visit (HOSPITAL_COMMUNITY)
Admission: RE | Admit: 2013-12-13 | Discharge: 2013-12-13 | Disposition: A | Discharge status: Home / Self Care | Payer: BC Managed Care – PPO | Source: Ambulatory Visit | Attending: Vascular Surgery | Admitting: Vascular Surgery

## 2013-12-13 ENCOUNTER — Ambulatory Visit (INDEPENDENT_AMBULATORY_CARE_PROVIDER_SITE_OTHER): Payer: BC Managed Care – PPO | Admitting: Vascular Surgery

## 2013-12-13 ENCOUNTER — Encounter: Payer: Self-pay | Admitting: Vascular Surgery

## 2013-12-13 VITALS — BP 116/78 | HR 66 | Ht 65.0 in | Wt 201.6 lb

## 2013-12-13 DIAGNOSIS — I83893 Varicose veins of bilateral lower extremities with other complications: Secondary | ICD-10-CM | POA: Insufficient documentation

## 2013-12-13 NOTE — Progress Notes (Signed)
[facility] HISTORY AND PHYSICAL   History of Present Illness:  Patient is a 43 y.o. year old female who presents for evaluation of symptomatic varicose veins. The patient complains primarily of pain around the medial aspect of her greater knee after lengthy exercise activities especially tenderness. She has been wearing long leg thigh high compression stockings since April 2015. She started wearing these after being seen it [facility]. She states that these do to improve her symptoms some but after tests her symptoms are sometimes unbearable. She is a very active person and has problems with achiness fullness and pain in her legs due to her varicose veins. She denies prior history of ulcerations. Her leg pain is helped with elevation of the legs at the end of the evening. He states it is also improved with massaging her legs. Other medical problems include depression and seasonal allergies polycystic ovary syndrome which are currently controlled. She denies prior history of DVT. She denies any significant trauma to her lower extremities in the past. She has had a right bunionectomy. She does have family history of varicose veins in her mother and her grandparents on both sides.  Past Medical History  Diagnosis Date  . Depression   . Allergy   . Asthma   . Extreme insulin resistance type A     Past Surgical History  Procedure Laterality Date  . Bunionectomy      Social History History  Substance Use Topics  . Smoking status: Never Smoker   . Smokeless tobacco: Not on file  . Alcohol Use: Yes     Comment: occasional    Family History Family History  Problem Relation Age of Onset  . Varicose Veins Mother     Allergies  Allergies  Allergen Reactions  . Contrast Media [Iodinated Diagnostic Agents]   . Iohexol      Code: HIVES, Desc: 1 hive developed on hand post injection of 125cc's Omni 300., Onset Date: [id number]   . Penicillins Other  (See Comments)    unknown     Current Outpatient Prescriptions  Medication Sig Dispense Refill  . fluticasone (FLONASE) 50 MCG/ACT nasal spray Place 1 spray into the nose daily.      Marland Kitchen levocetirizine (XYZAL) 5 MG tablet Take 5 mg by mouth every evening.        . minocycline (MINOCIN,DYNACIN) 100 MG capsule Take 100 mg by mouth daily.        . montelukast (SINGULAIR) 10 MG tablet Take 10 mg by mouth at bedtime.      . Multiple Vitamin (MULTIVITAMIN) tablet Take 1 tablet by mouth daily.        Marland Kitchen spironolactone (ALDACTONE) 100 MG tablet Take 50 mg by mouth 2 (two) times daily.      . zaleplon (SONATA) 10 MG capsule Take 10 mg by mouth at bedtime as needed. For sleep       . azithromycin (ZITHROMAX) 250 MG tablet 2 tabs po qd x 1d, then 1 tab po qd x 4d  6 each  0  . HYDROcodone-homatropine (HYCODAN) 5-1.5 MG/5ML syrup 1-2 tsp po q6h prn cough/aches/flu-symptoms  120 mL  0  . predniSONE (DELTASONE) 20 MG tablet 2 tabs po qd x 5d  10 tablet  0   No current facility-administered medications for this visit.    ROS:   General:  No weight loss, Fever, chills  HEENT: No recent headaches, no nasal bleeding, no visual changes, no sore throat  Neurologic: No dizziness, blackouts, seizures. No recent symptoms of stroke or mini- stroke. No recent episodes of slurred speech, or temporary blindness.  Cardiac: No recent episodes of chest pain/pressure, no shortness of breath at rest.  No shortness of breath with exertion.  Denies history of atrial fibrillation or irregular heartbeat  Vascular: No history of rest pain in feet.  No history of claudication.  No history of non-healing ulcer, No history of DVT   Pulmonary: No home oxygen, no productive cough, no hemoptysis,  No asthma or wheezing  Musculoskeletal:  [ ]  Arthritis, [ ]  Low back pain,  [ ]  Joint pain  Hematologic:No history of hypercoagulable state.  No history of easy bleeding.  No history of anemia  Gastrointestinal: No hematochezia  or melena,  No gastroesophageal reflux, no trouble swallowing  Urinary: [ ]  chronic Kidney disease, [ ]  on HD - [ ]  MWF or [ ]  TTHS, [ ]  Burning with urination, [ ]  Frequent urination, [ ]  Difficulty urinating;   Skin: No rashes  Psychological: No history of anxiety,  No history of depression   Physical Examination  Filed Vitals:   12/13/13 1008  BP: 116/78  Pulse: 66  Height: 5\' 5"  (1.651 m)  Weight: 201 lb 9.6 oz (91.445 kg)  SpO2: 100%    Body mass index is 33.55 kg/(m^2).  General:  Alert and oriented, no acute distress HEENT: Normal Neck: No bruit or JVD Pulmonary: Clear to auscultation bilaterally Cardiac: Regular Rate and Rhythm without murmur Abdomen: Soft, non-tender, non-distended, no mass, no scars Skin: No rash Extremity Pulses:  2+ radial, brachial, femoral, dorsalis pedis, posterior tibial pulses bilaterally Musculoskeletal: No deformity or edema  Neurologic: Upper and lower extremity motor 5/5 and symmetric  DATA:  Patient had bilateral reflux study today. She had no evidence of DVT. She did have evidence of deep vein reflux in the right common femoral and right popliteal vein. She also had reflux in the greater saphenous vein bilaterally as well as a right accessory saphenous. The right saphenous is 4-9 mm in diameter. The left is 5-7 mm in diameter. She had no evidence of deep reflux in the left.  ASSESSMENT:  Bilateral superficial venous incompetence of the greater saphenous vein symptomatic with no significant relief from compression stockings.   PLAN: Patient will continue wearing her compression stockings as much as possible to try to improve symptoms. She will elevate her legs. She will followup in 3 months time of my partner Dr. Donnetta Hutching or Dr. Kellie Simmering to consider laser ablation.  Ruta Hinds, MD [facility] Office: [phone] Pager: [phone]

## 2014-01-16 ENCOUNTER — Ambulatory Visit (INDEPENDENT_AMBULATORY_CARE_PROVIDER_SITE_OTHER): Payer: BC Managed Care – PPO | Admitting: Internal Medicine

## 2014-01-16 ENCOUNTER — Encounter: Payer: Self-pay | Admitting: Internal Medicine

## 2014-01-16 VITALS — BP 116/70 | Temp 98.0°F | Ht 65.0 in | Wt 202.0 lb

## 2014-01-16 DIAGNOSIS — J019 Acute sinusitis, unspecified: Secondary | ICD-10-CM

## 2014-01-16 DIAGNOSIS — J0181 Other acute recurrent sinusitis: Secondary | ICD-10-CM

## 2014-01-16 DIAGNOSIS — J3089 Other allergic rhinitis: Secondary | ICD-10-CM

## 2014-01-16 MED ORDER — PREDNISONE 20 MG PO TABS: ORAL_TABLET | ORAL | Status: DC

## 2014-01-16 MED ORDER — LEVOFLOXACIN 750 MG PO TABS: 750.0000 mg | ORAL_TABLET | Freq: Every day | ORAL | Status: DC

## 2014-01-16 NOTE — Progress Notes (Signed)
Pre visit review using our clinic review tool, if applicable. No additional management support is needed unless otherwise documented below in the visit note.  Chief Complaint  Patient presents with  . Sinus Pressure/Pain    Was in bed for 4 days.  Up for 3 days but cannot get rid of her sx.  Uses her [employer] multiple times a day.  Sunday Corn Sensitivity  . Facial Swelling  . Nasal Discharge    HPI: Patient Jordan Mayer  comes in today for SDA for  new problem evaluation. Last seen by this provider over 2 years ago.  Onset like a cold 7 days ago .  And malaise  In bed  And now  3rd day out of bed    And sinus related . Pain sting in face and light sensitivity  Face hurts. Teeth hurt  Lots of  nasal congestion using  Irrigation.   Sx and progressing.   Not a lot of cough  At this time.  Allergy shots  2 x per week.singulari flonase allegra and eye drops.  Usually gets steroid shot and z pack and other.antibiotic if needed at her allergist when something like this happens.  Was told she might need a sinus CT if it recurs frequently. not enough .  ROS: See pertinent positives and negatives per HPI. No chest pain shortness of breath concerns she will give bronchitis. Denies risk of pregnancy Is on minocycline as per lactation for skin condition where she gets acneiform cystic lesions.  Past Medical History  Diagnosis Date  . Depression   . Allergy   . Asthma   . Chicken pox   . Shingles   . PCOS (polycystic ovarian syndrome)   . 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 ADD (attention deficit disorder)     Family History  Problem Relation Age of Onset  . Varicose Veins Mother   . Asthma Mother   . Varicose Veins Sister   . Birth defects Maternal Aunt     Cognitively Challenged  . Miscarriages / Stillbirths Maternal Aunt   . Varicose Veins Maternal Aunt   . Cancer Paternal Aunt     Melanoma  . Hearing loss Paternal Aunt   . Varicose Veins Paternal Aunt   . Arthritis Maternal Grandmother     . Arthritis Paternal Grandfather   . Hearing loss Paternal Grandfather     History   Social History  . Marital Status: Married    Spouse Name: N/A    Number of Children: N/A  . Years of Education: N/A   Social History Main Topics  . Smoking status: Never Smoker   . Smokeless tobacco: Not on file  . Alcohol Use: Yes     Comment: occasional  . Drug Use: No  . Sexual Activity: Yes    Birth Control/ Protection: IUD   Other Topics Concern  . Not on file   Social History Narrative   7 hours of sleep per night   Lives with her husband and 2 kids   Does not work/Full Time Volunteer   Has a dog in the home    Outpatient Encounter Prescriptions as of 01/16/2014  Medication Sig  . ALPRAZolam (XANAX) 0.5 MG tablet Take 1/2 to a whole tablet as needed.  Marland Kitchen escitalopram (LEXAPRO) 10 MG tablet Take 10 mg by mouth daily.   . fexofenadine (ALLEGRA) 180 MG tablet Take 180 mg by mouth daily.  . fluticasone (FLONASE) 50 MCG/ACT nasal spray Place 1  spray into the nose daily.  . minocycline (MINOCIN,DYNACIN) 100 MG capsule Take 100 mg by mouth daily.    . montelukast (SINGULAIR) 10 MG tablet Take 10 mg by mouth at bedtime.  . Multiple Vitamin (MULTIVITAMIN) tablet Take 1 tablet by mouth daily.    Marland Kitchen spironolactone (ALDACTONE) 100 MG tablet Take 100 mg by mouth daily.   . traZODone (DESYREL) 50 MG tablet Take 1/4 tab qhs prn  . zaleplon (SONATA) 10 MG capsule Take 10 mg by mouth at bedtime as needed. For sleep   . levofloxacin (LEVAQUIN) 750 MG tablet Take 1 tablet (750 mg total) by mouth daily.  . predniSONE (DELTASONE) 20 MG tablet Take 3 po qd for 2 days then 2 po qd for 3 days,or as directed  . [DISCONTINUED] azithromycin (ZITHROMAX) 250 MG tablet 2 tabs po qd x 1d, then 1 tab po qd x 4d  . [DISCONTINUED] HYDROcodone-homatropine (HYCODAN) 5-1.5 MG/5ML syrup 1-2 tsp po q6h prn cough/aches/flu-symptoms  . [DISCONTINUED] levocetirizine (XYZAL) 5 MG tablet Take 5 mg by mouth every evening.     . [DISCONTINUED] predniSONE (DELTASONE) 20 MG tablet 2 tabs po qd x 5d    EXAM:  BP 116/70  Temp(Src) 98 F (36.7 C) (Oral)  Ht 5\' 5"  (1.651 m)  Wt 202 lb (91.627 kg)  BMI 33.61 kg/m2  Body mass index is 33.61 kg/(m^2).  GENERAL: vitals reviewed and listed above, alert, oriented, appears well hydrated and in no acute distress looks congested and mildly ill nontoxic HEENT: atraumatic, conjunctiva  clear, no obvious abnormalities on inspection of external nose and ears TMs are clear her eyes conjunctiva is clear face exquisitely tender over both maxillary sinuses and a bit of frontal. Nares congested OP : no lesion edema or exudate airway adequate NECK: no obvious masses on inspection palpation shoddy a.c. nodes LUNGS: clear to auscultation bilaterally, no wheezes, rales or rhonchi, good air movement CV: HRRR, no clubbing cyanosis or  peripheral edema nl cap refill  MS: moves all extremities without noticeable focal  abnormality PSYCH: pleasant and cooperative, no obvious depression or anxiety  ASSESSMENT AND PLAN:  Discussed the following assessment and plan:  Other acute recurrent sinusitis - By history but none recently complicated URI maxillary. Reviewed guidelines for no antibiotic for uncomplicated sinusitis however I feel that this is complicate  Other allergic rhinitis - On desensitization shots sees allergist maintenance meds Antibiotic indicated with severity of symptoms and underlying allergy and recurrent problem. She's allergic to penicillin so will give Levaquin 750 by mouth daily x7 days 5 days of prednisone in case this is needed. Discussed risk-benefit of steroids. Expectant management. May need followup if persistent progressive ;would hold the minocycline washes on a different antibiotic. -Patient advised to return or notify health care team  if symptoms worsen ,persist or new concerns arise.  Patient Instructions  Antibiotic and can add 3-5 days of prednisone if  needed also. contine sinus   Hygiene . Expect a lot better 2-5  Days . If not  Contact us  If not better .    Standley Brooking. Panosh M.D.

## 2014-01-16 NOTE — Patient Instructions (Signed)
Antibiotic and can add 3-5 days of prednisone if needed also. contine sinus   Hygiene . Expect a lot better 2-5  Days . If not  Contact us  If not better .

## 2014-02-05 ENCOUNTER — Other Ambulatory Visit: Payer: Self-pay | Admitting: Dermatology

## 2014-02-06 ENCOUNTER — Encounter: Payer: Self-pay | Admitting: Family Medicine

## 2014-02-06 ENCOUNTER — Telehealth: Payer: Self-pay | Admitting: Internal Medicine

## 2014-02-06 ENCOUNTER — Ambulatory Visit (INDEPENDENT_AMBULATORY_CARE_PROVIDER_SITE_OTHER): Payer: BC Managed Care – PPO | Admitting: Family Medicine

## 2014-02-06 VITALS — BP 122/74 | HR 70 | Temp 98.4°F | Wt 201.0 lb

## 2014-02-06 DIAGNOSIS — J329 Chronic sinusitis, unspecified: Secondary | ICD-10-CM

## 2014-02-06 MED ORDER — LEVOFLOXACIN 500 MG PO TABS: 500.0000 mg | ORAL_TABLET | Freq: Every day | ORAL | Status: DC

## 2014-02-06 NOTE — Patient Instructions (Signed)
Let us know in 2 weeks if symptoms not improved

## 2014-02-06 NOTE — Telephone Encounter (Signed)
Patient Information:  Caller Name: Aleia  Phone: [phone]  Patient: Fermina, Mishkin  Gender: Female  DOB: 1971-04-26  Age: 43 Years  PCP: Shanon Ace (Family Practice)  Pregnant: No  Office Follow Up:  Does the office need to follow up with this patient?: No  Instructions For The Office: N/A   Symptoms  Reason For Call & Symptoms: Pt was seen on 01/16/14 for a sinus infection. Pt finished 7 days of Levaquin and 5 days of Prednisone. She has not had any resolution yet. RN checked EPIC which indicated to call back if no resolution of symptoms.  Reviewed Health History In EMR: Yes  Reviewed Medications In EMR: Yes  Reviewed Allergies In EMR: Yes  Reviewed Surgeries / Procedures: Yes  Date of Onset of Symptoms: 01/11/2014 OB / GYN:  LMP: 01/30/2014  Guideline(s) Used:  Sinus Pain and Congestion  Disposition Per Guideline:   Go to Office Now  Reason For Disposition Reached:   Severe sinus pain  Advice Given:  N/A  Patient Will Follow Care Advice:  YES  Appointment Scheduled:  02/06/2014 15:30:00 Appointment Scheduled Provider:  Carolann Littler (Family Practice)

## 2014-02-06 NOTE — Progress Notes (Signed)
   Subjective:    Patient ID: Jordan Mayer, female    DOB: [birth year], 43 y.o.   MRN: [id number]  Sinusitis Associated symptoms include congestion, coughing, headaches and sinus pressure. Pertinent negatives include no chills.   Long history of allergies and recurrent sinusitis. She presents today with recurrent and possibly persistent symptoms since last visit. She was seen here early in the month and treated with seven-day history of Levaquin and also prednisone. She sees an allergist regularly and takes multiple medications including Singulair, Allegra, and Nasonex.  She has sinus congestion, intermittent headaches, yellowish nasal discharge, facial pain maxillary and frontal, occasional dry cough, and upper teeth pain. She's tried saline irrigation without improvement. She's had at least 3 weeks of symptoms that are waxing and waning  Past Medical History  Diagnosis Date  . Depression   . Allergy   . Asthma   . Chicken pox   . Shingles   . PCOS (polycystic ovarian syndrome)   . 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 ADD (attention deficit disorder)    Past Surgical History  Procedure Laterality Date  . Bunionectomy      reports that she has never smoked. She does not have any smokeless tobacco history on file. She reports that she drinks alcohol. She reports that she does not use illicit drugs. family history includes Arthritis in her maternal grandmother and paternal grandfather; Asthma in her mother; Birth defects in her maternal aunt; Cancer in her paternal aunt; Hearing loss in her paternal aunt and paternal grandfather; Miscarriages / [place] in her maternal aunt; Varicose Veins in her maternal aunt, mother, paternal aunt, and sister. Allergies  Allergen Reactions  . Contrast Media [Iodinated Diagnostic Agents]   . Iohexol      Code: HIVES, Desc: 1 hive developed on hand post injection of 125cc's Omni 300., Onset Date: [id number]   . Penicillins Other (See Comments)    unknown       Review of Systems  Constitutional: Positive for fatigue. Negative for fever and chills.  HENT: Positive for congestion and sinus pressure.   Respiratory: Positive for cough.   Neurological: Positive for headaches.       Objective:   Physical Exam  Constitutional: She appears well-developed and well-nourished. No distress.  HENT:  Right Ear: External ear normal.  Left Ear: External ear normal.  Mouth/Throat: Oropharynx is clear and moist.  Neck: Neck supple.  Cardiovascular: Normal rate and regular rhythm.   Pulmonary/Chest: Effort normal and breath sounds normal. No respiratory distress. She has no wheezes. She has no rales.          Assessment & Plan:  Recurrent/chronic sinusitis. She has underlying allergies which are chronic. Will treat with longer course of Levaquin 500 milligrams daily for 14 days. Consider limited CT of sinuses if no better with this. Continue saline nasal irrigation

## 2014-02-06 NOTE — Progress Notes (Signed)
Pre visit review using our clinic review tool, if applicable. No additional management support is needed unless otherwise documented below in the visit note. 

## 2014-03-15 ENCOUNTER — Encounter: Payer: Self-pay | Admitting: Vascular Surgery

## 2014-03-18 ENCOUNTER — Ambulatory Visit (INDEPENDENT_AMBULATORY_CARE_PROVIDER_SITE_OTHER): Payer: BC Managed Care – PPO | Admitting: Vascular Surgery

## 2014-03-18 ENCOUNTER — Encounter: Payer: Self-pay | Admitting: Vascular Surgery

## 2014-03-18 VITALS — BP 115/77 | HR 81 | Resp 16 | Ht 65.0 in | Wt 196.0 lb

## 2014-03-18 DIAGNOSIS — I83893 Varicose veins of bilateral lower extremities with other complications: Secondary | ICD-10-CM

## 2014-03-18 DIAGNOSIS — I83899 Varicose veins of unspecified lower extremities with other complications: Secondary | ICD-10-CM | POA: Insufficient documentation

## 2014-03-18 NOTE — Progress Notes (Signed)
Subjective:     Patient ID: Jordan Mayer, female   DOB: 1971/03/08, 43 y.o.   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 She was seen by Dr. Oneida Alar 3 months ago found to have gross reflux in bilateral great saphenous veins. She has tried long-leg elastic compression stockings 20-30 mm gradient as well as elevation and ibuprofen and continues to have aching discomfort right worse than left. She plays tennis frequently and states that the veins began to bulge significantly and increase in discomfort following her tennis match. She has no history of DVT.  Past Medical History  Diagnosis Date  . Depression   . Allergy   . Asthma   . Chicken pox   . Shingles   . PCOS (polycystic ovarian syndrome)   . 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 ADD (attention deficit disorder)     History  Substance Use Topics  . Smoking status: Never Smoker   . Smokeless tobacco: Not on file  . Alcohol Use: Yes     Comment: occasional    Family History  Problem Relation Age of Onset  . Varicose Veins Mother   . Asthma Mother   . Varicose Veins Sister   . Birth defects Maternal Aunt     Cognitively Challenged  . Miscarriages / Stillbirths Maternal Aunt   . Varicose Veins Maternal Aunt   . Cancer Paternal Aunt     Melanoma  . Hearing loss Paternal Aunt   . Varicose Veins Paternal Aunt   . Arthritis Maternal Grandmother   . Arthritis Paternal Grandfather   . Hearing loss Paternal Grandfather     Allergies  Allergen Reactions  . Contrast Media [Iodinated Diagnostic Agents]   . Iohexol      Code: HIVES, Desc: 1 hive developed on hand post injection of 125cc's Omni 300., Onset Date: 12458099   . Penicillins Other (See Comments)    unknown    Current outpatient prescriptions: ALPRAZolam (XANAX) 0.5 MG tablet, Take 1/2 to a whole tablet as needed., Disp: , Rfl: ;  doxycycline (DORYX) 100 MG DR capsule, Take 100 mg  by mouth 2 (two) times daily., Disp: , Rfl: ;  escitalopram (LEXAPRO) 10 MG tablet, Take 10 mg by mouth daily. , Disp: , Rfl: ;  fexofenadine (ALLEGRA) 180 MG tablet, Take 180 mg by mouth daily., Disp: , Rfl:  fluticasone (FLONASE) 50 MCG/ACT nasal spray, Place 1 spray into the nose daily., Disp: , Rfl: ;  montelukast (SINGULAIR) 10 MG tablet, Take 10 mg by mouth at bedtime., Disp: , Rfl: ;  Multiple Vitamin (MULTIVITAMIN) tablet, Take 1 tablet by mouth daily.  , Disp: , Rfl: ;  spironolactone (ALDACTONE) 100 MG tablet, Take 100 mg by mouth daily. , Disp: , Rfl: ;  traZODone (DESYREL) 50 MG tablet, Take 1/4 tab qhs prn, Disp: , Rfl:  zaleplon (SONATA) 10 MG capsule, Take 10 mg by mouth at bedtime as needed. For sleep , Disp: , Rfl: ;  levofloxacin (LEVAQUIN) 500 MG tablet, Take 1 tablet (500 mg total) by mouth daily., Disp: 14 tablet, Rfl: 0;  minocycline (MINOCIN,DYNACIN) 100 MG capsule, Take 100 mg by mouth daily.  , Disp: , Rfl:   BP 115/77 mmHg  Pulse 81  Resp 16  Ht 5\' 5"  (1.651 m)  Wt 196 lb (88.905 kg)  BMI 32.62 kg/m2  Body mass index is 32.62 kg/(m^2).  Review of SystemsDenies chest pain, dyspnea on exertion, PND, orthopnea, hemoptysis.     Objective:   Physical Exam BP 115/77 mmHg  Pulse 81  Resp 16  Ht 5\' 5"  (1.651 m)  Wt 196 lb (88.905 kg)  BMI 32.62 kg/m2  General alert and oriented 3 in no apparent distress Lungs no rhonchi or wheezing Right leg with bulging varicosities in the medial distal thigh and left leg with prominent spider/reticular veins posterior calf. No active ulceration noted. 3 posterior cells pedis pulse palpable.  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

## 2014-03-19 ENCOUNTER — Ambulatory Visit: Payer: BC Managed Care – PPO | Admitting: Vascular Surgery

## 2014-03-29 ENCOUNTER — Encounter: Payer: Self-pay | Admitting: Vascular Surgery

## 2014-04-01 ENCOUNTER — Ambulatory Visit (INDEPENDENT_AMBULATORY_CARE_PROVIDER_SITE_OTHER): Payer: BC Managed Care – PPO | Admitting: Vascular Surgery

## 2014-04-01 ENCOUNTER — Encounter: Payer: Self-pay | Admitting: Vascular Surgery

## 2014-04-01 VITALS — BP 114/80 | HR 82 | Resp 16 | Ht 65.0 in | Wt 195.0 lb

## 2014-04-01 DIAGNOSIS — I83812 Varicose veins of left lower extremities with pain: Secondary | ICD-10-CM

## 2014-04-01 DIAGNOSIS — I83893 Varicose veins of bilateral lower extremities with other complications: Secondary | ICD-10-CM

## 2014-04-01 NOTE — Progress Notes (Signed)
Subjective:     Patient ID: Jordan Mayer, female   DOB: [birth year], 43 y.o.   MRN: [id number]  HPIthis 43 year old female had laser ablation of the right great saphenous vein from the knee to near the saphenofemoral junction plus multiple stab phlebectomy of painful varicosities performed under local tumescent anesthesia. She tolerated the procedure well.   Review of Systems     Objective:   Physical Exam BP 114/80 mmHg  Pulse 82  Resp 16  Ht 5\' 5"  (1.651 m)  Wt 195 lb (88.451 kg)  BMI 32.45 kg/m2       Assessment:     Well-tolerated laser ablation right great saphenous vein plus multiple stab phlebectomy of painful varicosities performed under local tumescent anesthesia     Plan:     Return in one week for venous duplex exam We'll then schedule similar procedure on the contralateral left leg

## 2014-04-01 NOTE — Progress Notes (Signed)
   Laser Ablation Procedure      Date: 04/01/2014    Jordan Mayer DOB:27-Sep-1970  Consent signed: Yes  Surgeon:J.D. Kellie Simmering  Procedure: Laser Ablation: right Greater Saphenous Vein  BP 114/80 mmHg  Pulse 82  Resp 16  Ht 5\' 5"  (1.651 m)  Wt 195 lb (88.451 kg)  BMI 32.45 kg/m2  Start time: 1:15   End time: 2:20  Tumescent Anesthesia: 425 cc 0.9% NaCl with 50 cc Lidocaine HCL with 1% Epi and 15 cc 8.4% NaHCO3  Local Anesthesia: 7 cc Lidocaine HCL and NaHCO3 (ratio 2:1)  Pulsed mode: 15 watts, 566ms delay, 1.0 duration Total energy: 1695, total pulses: 114, total time: 1:53     Patient tolerated procedure well: Yes  Notes:   Description of Procedure:  After marking the course of the secondary varicosities, the patient was placed on the operating table in the supine position, and the right leg was prepped and draped in sterile fashion.   Local anesthetic was administered and under ultrasound guidance the saphenous vein was accessed with a micro needle and guide wire; then the mirco puncture sheath was place.  A guide wire was inserted saphenofemoral junction , followed by a 5 french sheath.  The position of the sheath and then the laser fiber below the junction was confirmed using the ultrasound.  Tumescent anesthesia was administered along the course of the saphenous vein using ultrasound guidance. The patient was placed in Trendelenburg position and protective laser glasses were placed on patient and staff, and the laser was fired at 15 watt pulsed mode advancing 1-2 mm per sec for a total of 1695 joules.   For stab phlebectomies, local anesthetic was administered at the previously marked varicosities, and tumescent anesthesia was administered around the vessels.  10-20 stab wounds were made using the tip of an 11 blade. And using the vein hook, the phlebectomies were performed using a hemostat to avulse the varicosities.  Adequate hemostasis was achieved.     Steri strips  were applied to the stab wounds and ABD pads and thigh high compression stockings were applied.  Ace wrap bandages were applied over the phlebectomy sites and at the top of the saphenofemoral junction. Blood loss was less than 15 cc.  The patient ambulated out of the operating room having tolerated the procedure well.

## 2014-04-02 ENCOUNTER — Telehealth: Payer: Self-pay | Admitting: *Deleted

## 2014-04-02 NOTE — Telephone Encounter (Signed)
Pt doing well. No problems other than slight discomfort during the night. No bleeding through the dsgs from stab sites. Following all instructions. She knopws about her fu appts.

## 2014-04-05 ENCOUNTER — Encounter: Payer: Self-pay | Admitting: Vascular Surgery

## 2014-04-08 ENCOUNTER — Other Ambulatory Visit: Payer: Self-pay | Admitting: *Deleted

## 2014-04-08 ENCOUNTER — Ambulatory Visit (INDEPENDENT_AMBULATORY_CARE_PROVIDER_SITE_OTHER): Payer: Self-pay | Admitting: Vascular Surgery

## 2014-04-08 ENCOUNTER — Ambulatory Visit (HOSPITAL_COMMUNITY)
Admission: RE | Admit: 2014-04-08 | Discharge: 2014-04-08 | Disposition: A | Discharge status: Home / Self Care | Payer: BC Managed Care – PPO | Source: Ambulatory Visit | Attending: Vascular Surgery | Admitting: Vascular Surgery

## 2014-04-08 VITALS — BP 106/79 | HR 79 | Resp 14

## 2014-04-08 DIAGNOSIS — I83893 Varicose veins of bilateral lower extremities with other complications: Secondary | ICD-10-CM

## 2014-04-08 NOTE — Progress Notes (Signed)
Subjective:     Patient ID: Jordan Mayer, female   DOB: 24-Apr-1971, 43 y.o.   MRN: [id number]  HPI this 43 year old female returns 1 week post laser ablation right great saphenous vein with multiple stab phlebectomy of painful varicosities. She has done very well. She had some mild to moderate discomfort initially in the groin area and then in the distal thigh but that is now resolving. She has had no chest pain, dyspnea on exertion, orthopnea, or hemoptysis. She has also noticed no distal edema. She has worn her stocking as instructed and elevate the leg.   Review of Systems     Objective:   Physical Exam BP 106/79 mmHg  Pulse 79  Resp 14  Gen. well-developed well-nourished female in no apparent distress alert and oriented 3 Lungs no rhonchi or wheezing Right leg with mild tenderness along course of great saphenous vein directly in mid to distal thigh. No erythema. No distal edema. 3+ dorsalis pedis pulse palpable.  Today I ordered a venous duplex exam to the right leg which I reviewed and interpreted. There is no DVT. The great saphenous vein is totally closed from the knee to near the saphenofemoral junction.     Assessment:     Successful laser ablation right great saphenous vein with multiple stab phlebectomy was performed under local tumescent anesthesia Return next week for similar procedure on contralateral left leg    Plan:

## 2014-04-10 ENCOUNTER — Encounter: Payer: Self-pay | Admitting: Surgery

## 2014-04-15 ENCOUNTER — Encounter: Payer: Self-pay | Admitting: Vascular Surgery

## 2014-04-15 ENCOUNTER — Ambulatory Visit (INDEPENDENT_AMBULATORY_CARE_PROVIDER_SITE_OTHER): Payer: BC Managed Care – PPO | Admitting: Vascular Surgery

## 2014-04-15 VITALS — BP 107/72 | HR 83 | Resp 16 | Ht 65.0 in | Wt 196.0 lb

## 2014-04-15 DIAGNOSIS — I8392 Asymptomatic varicose veins of left lower extremity: Secondary | ICD-10-CM

## 2014-04-15 DIAGNOSIS — I83892 Varicose veins of left lower extremities with other complications: Secondary | ICD-10-CM

## 2014-04-15 NOTE — Progress Notes (Signed)
Subjective:     Patient ID: Jordan Mayer, female   DOB: Jun 09, 1970, 43 y.o.   MRN: [id number]  HPI this 43 year old female had laser ablation left great saphenous vein from the proximal calf to near the saphenofemoral junction performed under local tumescent anesthesia. A total of 1700 J of energy was utilized. She tolerated the procedure well.   Review of Systems     Objective:   Physical Exam BP 107/72 mmHg  Pulse 83  Resp 16  Ht 5\' 5"  (1.651 m)  Wt 196 lb (88.905 kg)  BMI 32.62 kg/m2       Assessment:     Well-tolerated laser ablation left great saphenous vein performed under local tumescent anesthesia Sclerotherapy also performed left leg of secondary tributaries    Plan:     Return in one week venous duplex exam left leg

## 2014-04-15 NOTE — Progress Notes (Signed)
   Laser Ablation Procedure      Date: 04/15/2014    Jordan Mayer DOB:1970/07/17  Consent signed: Yes  Surgeon:J.D. Kellie Simmering  Procedure: Laser Ablation: left Greater Saphenous Vein  BP 107/72 mmHg  Pulse 83  Resp 16  Ht 5\' 5"  (1.651 m)  Wt 196 lb (88.905 kg)  BMI 32.62 kg/m2  Start time: 1:15   End time: 2:20  Tumescent Anesthesia: 400 cc 0.9% NaCl with 50 cc Lidocaine HCL with 1% Epi and 15 cc 8.4% NaHCO3  Local Anesthesia: 7 cc Lidocaine HCL and NaHCO3 (ratio 2:1)  Pulsed mode: 15 watts, 574ms delay, 1.0 duration Total energy: 2077, total pulses: 139, total time: 2:18   Sclerotherapy: .3 %Sotradecol. Patient received a total of 3 cc   Patient tolerated procedure well: Yes  Notes:   Description of Procedure:  After marking the course of the secondary varicosities, the patient was placed on the operating table in the supine position, and the left leg was prepped and draped in sterile fashion.   Local anesthetic was administered and under ultrasound guidance the saphenous vein was accessed with a micro needle and guide wire; then the mirco puncture sheath was place.  A guide wire was inserted saphenofemoral junction , followed by a 5 french sheath.  The position of the sheath and then the laser fiber below the junction was confirmed using the ultrasound.  Tumescent anesthesia was administered along the course of the saphenous vein using ultrasound guidance. The patient was placed in Trendelenburg position and protective laser glasses were placed on patient and staff, and the laser was fired at 15 watt pulsed mode advancing 1-2 mm per sec for a total of 2077 joules.     Sclerotherapy was performed to several varicosities using 3  cc .3% Sotradecol foam via a 27g butterfly needle.  Steri strips were applied to the stab wounds and ABD pads and thigh high compression stockings were applied.  Ace wrap bandages were applied over the phlebectomy sites and at the top of the  saphenofemoral junction. Blood loss was less than 15 cc.  The patient ambulated out of the operating room having tolerated the procedure well.

## 2014-04-16 ENCOUNTER — Telehealth: Payer: Self-pay | Admitting: *Deleted

## 2014-04-16 NOTE — Telephone Encounter (Signed)
Pt doing well. No problems or concerns. Following all instructions.

## 2014-04-17 ENCOUNTER — Encounter: Payer: Self-pay | Admitting: Vascular Surgery

## 2014-04-22 ENCOUNTER — Encounter: Payer: Self-pay | Admitting: Vascular Surgery

## 2014-04-22 ENCOUNTER — Ambulatory Visit (HOSPITAL_COMMUNITY)
Admission: RE | Admit: 2014-04-22 | Discharge: 2014-04-22 | Disposition: A | Discharge status: Home / Self Care | Payer: BC Managed Care – PPO | Source: Ambulatory Visit | Attending: Vascular Surgery | Admitting: Vascular Surgery

## 2014-04-22 ENCOUNTER — Ambulatory Visit (INDEPENDENT_AMBULATORY_CARE_PROVIDER_SITE_OTHER): Payer: Self-pay | Admitting: Vascular Surgery

## 2014-04-22 VITALS — BP 117/79 | HR 79 | Resp 16

## 2014-04-22 DIAGNOSIS — I83893 Varicose veins of bilateral lower extremities with other complications: Secondary | ICD-10-CM | POA: Insufficient documentation

## 2014-04-22 NOTE — Progress Notes (Signed)
Subjective:     Patient ID: Jordan Mayer, female   DOB: [birth year], 43 y.o.   MRN: [id number]  HPI this 43 year old female returns 1 week post laser ablation left great saphenous vein with sclerotherapy. She states that she has had less discomfort from the laser ablation procedure on this leg compared to the right leg. She has had no distal edema. She has worn her elastic compression stocking and taken ibuprofen as instructed. She  Past Medical History  Diagnosis Date  . Depression   . Allergy   . Asthma   . Chicken pox   . Shingles   . PCOS (polycystic ovarian syndrome)   . 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 ADD (attention deficit disorder)     History  Substance Use Topics  . Smoking status: Never Smoker   . Smokeless tobacco: Never Used  . Alcohol Use: 0.0 oz/week    0 Not specified per week     Comment: occasional    Family History  Problem Relation Age of Onset  . Varicose Veins Mother   . Asthma Mother   . Varicose Veins Sister   . Birth defects Maternal Aunt     Cognitively Challenged  . Miscarriages / Stillbirths Maternal Aunt   . Varicose Veins Maternal Aunt   . Cancer Paternal Aunt     Melanoma  . Hearing loss Paternal Aunt   . Varicose Veins Paternal Aunt   . Arthritis Maternal Grandmother   . Arthritis Paternal Grandfather   . Hearing loss Paternal Grandfather     Allergies  Allergen Reactions  . Contrast Media [Iodinated Diagnostic Agents]   . Iohexol      Code: HIVES, Desc: 1 hive developed on hand post injection of 125cc's Omni 300., Onset Date: 18563149   . Penicillins Other (See Comments)    unknown    Current outpatient prescriptions: ALPRAZolam (XANAX) 0.5 MG tablet, Take 1/2 to a whole tablet as needed., Disp: , Rfl: ;  doxycycline (DORYX) 100 MG DR capsule, Take 100 mg by mouth 2 (two) times daily., Disp: , Rfl: ;  escitalopram (LEXAPRO) 10 MG tablet, Take 10 mg by mouth daily. , Disp: , Rfl: ;  fexofenadine (ALLEGRA) 180 MG tablet, Take 180 mg by  mouth daily., Disp: , Rfl:  fluticasone (FLONASE) 50 MCG/ACT nasal spray, Place 1 spray into the nose daily., Disp: , Rfl: ;  levofloxacin (LEVAQUIN) 500 MG tablet, Take 1 tablet (500 mg total) by mouth daily., Disp: 14 tablet, Rfl: 0;  minocycline (MINOCIN,DYNACIN) 100 MG capsule, Take 100 mg by mouth daily.  , Disp: , Rfl: ;  montelukast (SINGULAIR) 10 MG tablet, Take 10 mg by mouth at bedtime., Disp: , Rfl:  Multiple Vitamin (MULTIVITAMIN) tablet, Take 1 tablet by mouth daily.  , Disp: , Rfl: ;  spironolactone (ALDACTONE) 100 MG tablet, Take 100 mg by mouth daily. , Disp: , Rfl: ;  traZODone (DESYREL) 50 MG tablet, Take 1/4 tab qhs prn, Disp: , Rfl: ;  zaleplon (SONATA) 10 MG capsule, Take 10 mg by mouth at bedtime as needed. For sleep , Disp: , Rfl:   BP 117/79 mmHg  Pulse 79  Resp 16  There is no weight on file to calculate BMI.         Review of Systems denies chest pain, dyspnea on exertion, PND, orthopnea, hemoptysis.     Objective:   Physical Exam BP 117/79 mmHg  Pulse 79  Resp 16  Gen. well-developed well-nourished female no apparent  distress alert and oriented 3 Lungs no rhonchi or wheezing Cardiovascular regular rhythm no murmurs Left leg with minimal tenderness along course of great saphenous vein down to entrance site and proximal calf. No distal edema noted. 3+ dorsalis pedis pulse palpable.  Today I ordered a venous duplex exam of the left leg which I reviewed and interpreted. There is no DVT. There is total closure of the left great Saphenous vein from the proximal calf to near the saphenofemoral junction.    Assessment:     Successful bilateral laser ablation great saphenous veins with good result. Stab phlebectomy on right and sclerotherapy on left.    Plan:     1 more week of elastic compression on left leg and patient will return to see [place] on when necessary basis

## 2014-06-06 ENCOUNTER — Ambulatory Visit (INDEPENDENT_AMBULATORY_CARE_PROVIDER_SITE_OTHER): Payer: BLUE CROSS/BLUE SHIELD | Admitting: Pulmonary Disease

## 2014-06-06 ENCOUNTER — Encounter: Payer: Self-pay | Admitting: Pulmonary Disease

## 2014-06-06 VITALS — BP 122/78 | HR 75 | Ht 65.0 in | Wt 209.8 lb

## 2014-06-06 DIAGNOSIS — R0683 Snoring: Secondary | ICD-10-CM

## 2014-06-06 DIAGNOSIS — G4763 Sleep related bruxism: Secondary | ICD-10-CM

## 2014-06-06 NOTE — Patient Instructions (Signed)
Will arrange for sleep study Will call to arrange for follow up after sleep study reviewed 

## 2014-06-09 NOTE — Progress Notes (Signed)
Chief Complaint  Patient presents with  . Sleep Consult    Self referral - Epworth Score: 11    History of Present Illness: Jordan Mayer is a 44 y.o. female for evaluation of sleep problems.  She has noticed trouble with her sleep since childhood.  She has trouble falling asleep, and staying asleep.  She feels sleepy during the day.  Even if she gets extra time sleeping, she will still feel sleepy during the day.  She has noticed more trouble snoring, and waking up feeling a dry mouth.  Her husband has told her that she will stop breathing at night.  She will have more trouble sleeping on her back.  She does not dream very often.  She goes to sleep between 9 and 11 pm.  She falls asleep after 20 minutes.  She wakes up severe times during the night, and sometimes endu up using the bathroom.  She gets out of bed at 630 am.  She feels tired in the morning.  She denies morning headache.  She has been using trazodone, and sonata to help sleep.  She drinks coffee in the morning.  She has been told she grinds her teeth at night, and has to wear a guard.  She has hx of polycystic ovary disease.  As a result she has trouble controlling her weight.  She denies sleep walking, sleep talking, or nightmares.  There is no history of restless legs.  She denies sleep hallucinations, sleep paralysis, or cataplexy.  The Epworth score is 11 out of 24.   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  Jordan Mayer  has past surgical history that includes Bunionectomy; Wisdom tooth extraction; and Laser ablation.  Prior to Admission medications   Medication Sig Start Date End Date Taking? Authorizing Provider  doxycycline (DORYX) 100 MG DR capsule Take 100 mg by mouth 2 (two) times daily.   Yes Historical Provider, MD  escitalopram (LEXAPRO) 10 MG tablet  Take 10 mg by mouth daily.  12/10/13  Yes Meridith Baker, NP  fexofenadine (ALLEGRA) 180 MG tablet Take 180 mg by mouth daily.   Yes Historical Provider, MD  fluticasone (FLONASE) 50 MCG/ACT nasal spray Place 1 spray into the nose daily.   Yes Historical Provider, MD  montelukast (SINGULAIR) 10 MG tablet Take 10 mg by mouth at bedtime.   Yes Historical Provider, MD  Multiple Vitamin (MULTIVITAMIN) tablet Take 1 tablet by mouth daily.     Yes Historical Provider, MD  spironolactone (ALDACTONE) 100 MG tablet Take 100 mg by mouth daily.    Yes Historical Provider, MD  traZODone (DESYREL) 50 MG tablet Take 1/4 tab qhs prn 11/03/13  Yes Meridith Baker, NP  zaleplon (SONATA) 10 MG capsule Take 10-20 mg by mouth at bedtime as needed. For sleep    Yes Historical Provider, MD  ALPRAZolam Duanne Moron) 0.5 MG tablet Take 1/2 to a whole tablet as needed. 11/12/13   Dennard Nip, NP    Allergies  Allergen Reactions  . Contrast Media [Iodinated Diagnostic Agents]   . Iohexol      Code: HIVES, Desc: 1 hive developed on hand post injection of 125cc's Omni 300., Onset Date: 69629528   . Penicillins Other (See Comments)    unknown    Her family history includes Arthritis in her maternal grandmother and paternal grandfather; Asthma in her mother; Birth  defects in her maternal aunt; Cancer in her paternal aunt; Hearing loss in her paternal aunt and paternal grandfather; Miscarriages / [place] in her maternal aunt; Varicose Veins in her maternal aunt, mother, paternal aunt, and sister.  She  reports that she has never smoked. She has never used smokeless tobacco. She reports that she drinks alcohol. She reports that she does not use illicit drugs.   Physical Exam: Blood pressure 122/78, pulse 75, height 5\' 5"  (1.651 m), weight 209 lb 12.8 oz (95.165 kg), SpO2 98 %. Body mass index is 34.91 kg/(m^2).  General - No distress ENT - No sinus tenderness, no oral exudate, no LAN, no thyromegaly, TM clear, pupils  equal/reactive, MP 3 Cardiac - s1s2 regular, no murmur, pulses symmetric Chest - No wheeze/rales/dullness, good air entry, normal respiratory excursion Back - No focal tenderness Abd - Soft, non-tender, no organomegaly, + bowel sounds Ext - No edema Neuro - Normal strength, cranial nerves intact Skin - No rashes Psych - Normal mood, and behavior  Discussion: She has snoring, sleep disruption, witnessed apnea, and daytime sleepiness.  I am concerned she could have sleep apnea.  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  She has hx of insomnia, but some of her sleep complaints could actually be related to sleep apnea.  Assessment/plan:  Snoring. Plan: - will arrange for in lab sleep study - We discussed how sleep apnea can affect various health problems including risks for hypertension, cardiovascular disease, and diabetes.  We also discussed how sleep disruption can increase risks for accident, such as while driving.  Weight loss as a means of improving sleep apnea was also reviewed.  Additional treatment options discussed were CPAP therapy, oral appliance, and surgical intervention.  Sleep related bruxism. Plan: - will assess further during in lab sleep study - explained how bruxism can be related to sleep apnea  Insomnia. Plan: - she is to continue sonata, and trazodone for now - will re-assess after reviewing her sleep study >> if she has sleep apnea, then would address this first    Chesley Mires, M.D. Pager [phone]

## 2014-06-17 ENCOUNTER — Ambulatory Visit (HOSPITAL_BASED_OUTPATIENT_CLINIC_OR_DEPARTMENT_OTHER): Payer: BLUE CROSS/BLUE SHIELD | Attending: Pulmonary Disease | Admitting: Radiology

## 2014-06-17 VITALS — Ht 65.0 in | Wt 206.0 lb

## 2014-06-17 DIAGNOSIS — R0683 Snoring: Secondary | ICD-10-CM | POA: Insufficient documentation

## 2014-06-17 DIAGNOSIS — G4763 Sleep related bruxism: Secondary | ICD-10-CM | POA: Diagnosis not present

## 2014-06-19 ENCOUNTER — Other Ambulatory Visit: Payer: Self-pay | Admitting: Neurosurgery

## 2014-06-19 DIAGNOSIS — G540 Brachial plexus disorders: Secondary | ICD-10-CM

## 2014-06-19 NOTE — Sleep Study (Signed)
[place]  NAME: [name] DATE OF BIRTH:  06-03-1970 MEDICAL RECORD NUMBER [id number]  LOCATION: [facility]  PHYSICIAN: Chesley Mires, M.D. DATE OF STUDY: 06/17/2014  SLEEP STUDY TYPE: Polysomnogram               REFERRING PHYSICIAN: Chesley Mires, MD  INDICATION FOR STUDY:  Jordan Mayer is a 44 y.o. female who presents to the sleep lab for evaluation of hypersomnia with obstructive sleep apnea.  She reports snoring, sleep disruption, apnea, and daytime sleepiness.  She has history of bruxism and insomnia also.  EPWORTH SLEEPINESS SCORE: 12. HEIGHT: 5\' 5"  (165.1 cm)  WEIGHT: 206 lb (93.441 kg)    Body mass index is 34.28 kg/(m^2).  NECK SIZE: 14 in.  MEDICATIONS:  Current Outpatient Prescriptions on File Prior to Visit  Medication Sig Dispense Refill  . ALPRAZolam (XANAX) 0.5 MG tablet Take 1/2 to a whole tablet as needed.    . doxycycline (DORYX) 100 MG DR capsule Take 100 mg by mouth 2 (two) times daily.    Marland Kitchen escitalopram (LEXAPRO) 10 MG tablet Take 10 mg by mouth daily.     . fexofenadine (ALLEGRA) 180 MG tablet Take 180 mg by mouth daily.    . fluticasone (FLONASE) 50 MCG/ACT nasal spray Place 1 spray into the nose daily.    . montelukast (SINGULAIR) 10 MG tablet Take 10 mg by mouth at bedtime.    . Multiple Vitamin (MULTIVITAMIN) tablet Take 1 tablet by mouth daily.      Marland Kitchen spironolactone (ALDACTONE) 100 MG tablet Take 100 mg by mouth daily.     . traZODone (DESYREL) 50 MG tablet Take 1/4 tab qhs prn    . zaleplon (SONATA) 10 MG capsule Take 10-20 mg by mouth at bedtime as needed. For sleep      No current facility-administered medications on file prior to visit.    SLEEP ARCHITECTURE:  Total recording time: 376.5 minutes.  Total sleep time was: 198.5 minutes.  Sleep efficiency: 52.7%.  Sleep latency: 40 minutes.  REM latency: 263.5 minutes.  Stage N1: 23.4%.  Stage N2: 61%.  Stage N3: 0%.  Stage R:  15.6%.  Supine  sleep: 60 minutes.  Non-supine sleep: 138.5 minutes.  Spontaneous arousal index: 23.  She used sonata 10 mg x 3 doses.  CARDIAC DATA:  Average heart rate: 73 beats per minute. Rhythm strip: sinus rhythm with occasional PACs.  RESPIRATORY DATA: Average respiratory rate: 16. Snoring: mild. Average AHI: 0.3.   Apnea index: 0.  Hypopnea index: 0.3. Obstructive apnea index: 0.  Central apnea index: 0.  Mixed apnea index: 0. REM AHI: 1.9.  NREM AHI: 0. Supine AHI: 0. Non-supine AHI: 0.2.  MOVEMENT/PARASOMNIA:  Periodic limb movement: 0.  Period limb movements with arousals: 0. Restroom trips: 1.  OXYGEN DATA:  Baseline oxygenation: 97%. Lowest SaO2: 93%. Time spent below SaO2 90%: 0 minutes. Supplemental oxygen used: none.  IMPRESSION/ RECOMMENDATION:   This study did not show evidence for sleep disordered breathing, or periodic limb movements.  She did have one episode of bruxism at epoch 261.  She had significant sleep disruption with frequent arousals.     Chesley Mires, M.D. Diplomate, [employer] adviser of Sleep Medicine  ELECTRONICALLY SIGNED ON:  06/19/2014, 8:11 AM [place] PH: [phone]   FX: [phone] [place]

## 2014-06-25 ENCOUNTER — Other Ambulatory Visit: Payer: BLUE CROSS/BLUE SHIELD

## 2014-06-26 ENCOUNTER — Telehealth: Payer: Self-pay | Admitting: Pulmonary Disease

## 2014-06-26 DIAGNOSIS — R0683 Snoring: Secondary | ICD-10-CM

## 2014-06-26 NOTE — Telephone Encounter (Signed)
Discussed with pt.  She does not feel like had sufficient sleep, and as a result does not think that results are accurate.  She does not think she could sleep in sleep lab again.  Will try to arrange for home sleep study.

## 2014-06-27 ENCOUNTER — Encounter: Payer: Self-pay | Admitting: *Deleted

## 2014-06-28 ENCOUNTER — Ambulatory Visit (INDEPENDENT_AMBULATORY_CARE_PROVIDER_SITE_OTHER): Payer: BLUE CROSS/BLUE SHIELD | Admitting: *Deleted

## 2014-06-28 DIAGNOSIS — I8393 Asymptomatic varicose veins of bilateral lower extremities: Secondary | ICD-10-CM

## 2014-06-28 NOTE — Progress Notes (Signed)
X=.3% Sotradecol administered with a 27g butterfly.  Patient received a total of 6cc. Treated the area that came up after her laser ablation and then used up the remaining solution on scattered spiders. Tol well. Will follow prn. Cutaneous Laser:pulsed mode  810j/cm2 400 ms delay  13 ms Duration 0.5 spot  Total pulses: 50   Photos: No.  Compression stockings applied: Yes.

## 2014-07-02 ENCOUNTER — Encounter: Payer: Self-pay | Admitting: Vascular Surgery

## 2014-07-03 ENCOUNTER — Inpatient Hospital Stay: Admission: RE | Admit: 2014-07-03 | Payer: BLUE CROSS/BLUE SHIELD | Source: Ambulatory Visit

## 2014-07-05 ENCOUNTER — Institutional Professional Consult (permissible substitution): Payer: Self-pay | Admitting: Pulmonary Disease

## 2014-07-08 ENCOUNTER — Ambulatory Visit
Admission: RE | Admit: 2014-07-08 | Discharge: 2014-07-08 | Disposition: A | Discharge status: Home / Self Care | Payer: BLUE CROSS/BLUE SHIELD | Source: Ambulatory Visit | Attending: Neurosurgery | Admitting: Neurosurgery

## 2014-07-08 ENCOUNTER — Telehealth: Payer: Self-pay

## 2014-07-08 DIAGNOSIS — G540 Brachial plexus disorders: Secondary | ICD-10-CM

## 2014-07-08 MED ORDER — IOHEXOL 350 MG/ML SOLN
75.0000 mL | Freq: Once | INTRAVENOUS | Status: AC | PRN
  Administered 2014-07-08: 75 mL via INTRAVENOUS

## 2014-07-08 NOTE — Telephone Encounter (Signed)
Just got the approval today she will be called within the next few weeks Jordan Mayer

## 2014-07-08 NOTE — Telephone Encounter (Signed)
Spoke with pt, states she ran into [facility] this weekend and was reminded that she needs to schedule a home sleep study.  It looks like this was ordered by [facility] on 06/26/14, with a note saying that [facility] would be precerting this since it is [employer] [place].    PCC's please advise on the status of this.  Thanks!

## 2014-07-08 NOTE — Telephone Encounter (Signed)
Pt is aware that we will contact her in the next few weeks to get her set up. She verbalized understanding.

## 2014-07-23 ENCOUNTER — Telehealth: Payer: Self-pay | Admitting: Pulmonary Disease

## 2014-07-23 DIAGNOSIS — R0683 Snoring: Secondary | ICD-10-CM

## 2014-07-23 NOTE — Telephone Encounter (Signed)
Order placed for home sleep study

## 2014-07-23 NOTE — Telephone Encounter (Signed)
Well i have found out since i sent you this message that pt did her in lab study 06/17/14 and ins has denied it, so i have spoken to pt and billing dept @[facility]  and the billing dept is doing a appeal on this Jordan Mayer

## 2014-07-23 NOTE — Telephone Encounter (Signed)
-----   Message from Joellen Jersey sent at 07/22/2014  5:08 PM EST ----- V this pt's uins has denied a split night study but has approved a HST will you put in a order for that?

## 2014-07-23 NOTE — Telephone Encounter (Signed)
Okay.  How did she get scheduled before study was approved?  Also she needs to have home sleep study >> still have strong suspicion she has sleep apnea.

## 2014-07-24 NOTE — Telephone Encounter (Signed)
Elida.  Please keep me posted.

## 2014-07-24 NOTE — Telephone Encounter (Signed)
Sleep center had a cancellation and called her to come in but i was not notified so i never got it precerted

## 2014-07-24 NOTE — Telephone Encounter (Signed)
Well the billing dept is doing an appeal so there is nothing we can do right now Joellen Jersey

## 2014-07-24 NOTE — Telephone Encounter (Signed)
So can we tell the sleep center they have to write off the expense since pre-certification should have been confirmed prior to scheduling her sleep study.

## 2014-08-19 ENCOUNTER — Encounter (HOSPITAL_BASED_OUTPATIENT_CLINIC_OR_DEPARTMENT_OTHER): Payer: BLUE CROSS/BLUE SHIELD

## 2014-08-30 ENCOUNTER — Ambulatory Visit
Admit: 2014-08-30 | Disposition: A | Discharge status: Home / Self Care | Payer: Self-pay | Attending: Bariatrics | Admitting: Bariatrics

## 2014-08-30 DIAGNOSIS — R1011 Right upper quadrant pain: Secondary | ICD-10-CM | POA: Diagnosis not present

## 2015-02-25 ENCOUNTER — Encounter: Payer: Self-pay | Admitting: Adult Health

## 2015-02-25 ENCOUNTER — Ambulatory Visit (INDEPENDENT_AMBULATORY_CARE_PROVIDER_SITE_OTHER): Payer: BLUE CROSS/BLUE SHIELD | Admitting: Adult Health

## 2015-02-25 VITALS — BP 110/78 | HR 93 | Temp 99.0°F | Ht 65.0 in | Wt 175.3 lb

## 2015-02-25 DIAGNOSIS — J209 Acute bronchitis, unspecified: Secondary | ICD-10-CM

## 2015-02-25 DIAGNOSIS — G43819 Other migraine, intractable, without status migrainosus: Secondary | ICD-10-CM | POA: Diagnosis not present

## 2015-02-25 DIAGNOSIS — J029 Acute pharyngitis, unspecified: Secondary | ICD-10-CM | POA: Diagnosis not present

## 2015-02-25 LAB — POCT RAPID STREP A (OFFICE): RAPID STREP A SCREEN: NEGATIVE

## 2015-02-25 MED ORDER — SUMATRIPTAN SUCCINATE 50 MG PO TABS: 50.0000 mg | ORAL_TABLET | ORAL | Status: DC | PRN

## 2015-02-25 MED ORDER — HYDROCODONE-HOMATROPINE 5-1.5 MG/5ML PO SYRP: 5.0000 mL | ORAL_SOLUTION | Freq: Three times a day (TID) | ORAL | Status: DC | PRN

## 2015-02-25 MED ORDER — PREDNISONE 20 MG PO TABS: 20.0000 mg | ORAL_TABLET | Freq: Every day | ORAL | Status: DC

## 2015-02-25 NOTE — Progress Notes (Signed)
Subjective:    Patient ID: Jordan Mayer, female    DOB: Jun 14, 1970, 44 y.o.   MRN: [id number]  HPI  44 year old female who presents to the office today for URI type symptoms. On Friday she became fatigued and as the weekend went on she started to have body aches and headaches. This morning when she woke up she had a sore throat and dry cough with the feeling of congestion in her upper chest.. She also has a slight pain in her right ear.   When she went outside today she had a migraine with photo sensitivity.She gets migraines once every few years.   Possible subjective fever. She does endorse having a tendency to get bronchitis  Denies n/v/d. No abdominal pain.   Has not tried any OTC medication.    Review of Systems  Constitutional: Positive for fever, chills, activity change, appetite change and fatigue.  HENT: Positive for ear pain and sore throat. Negative for congestion, ear discharge, hearing loss, sinus pressure, tinnitus and trouble swallowing.   Eyes: Positive for photophobia.  Respiratory: Positive for cough and chest tightness. Negative for shortness of breath and wheezing.   Cardiovascular: Negative.   Gastrointestinal: Negative.   Musculoskeletal: Positive for myalgias and back pain. Negative for arthralgias.  Neurological: Positive for headaches. Negative for dizziness and weakness.  Psychiatric/Behavioral: Negative.   All other systems reviewed and are negative.  Past Medical History  Diagnosis Date  . Depression   . Allergy   . Asthma   . Chicken pox   . Shingles   . PCOS (polycystic ovarian syndrome)   . 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 ADD (attention deficit disorder)     Social History   Social History  . Marital Status: Married    Spouse Name: N/A  . Number of Children: N/A  . Years of Education: N/A   Occupational History  . Nurse, adult    Social History Main Topics  . Smoking status: Never Smoker   . Smokeless tobacco: Never Used  .  Alcohol Use: 0.0 oz/week    0 Standard drinks or equivalent per week     Comment: occasional  . Drug Use: No  . Sexual Activity: Yes    Birth Control/ Protection: IUD   Other Topics Concern  . Not on file   Social History Narrative   7 hours of sleep per night   Lives with her husband and 2 kids   Does not work/Full Time Volunteer   Has a dog in the home    Past Surgical History  Procedure Laterality Date  . Bunionectomy    . Wisdom tooth extraction    . Laser ablation      Vascular    Family History  Problem Relation Age of Onset  . Varicose Veins Mother   . Asthma Mother   . Varicose Veins Sister   . Birth defects Maternal Aunt     Cognitively Challenged  . Miscarriages / Stillbirths Maternal Aunt   . Varicose Veins Maternal Aunt   . Cancer Paternal Aunt     Melanoma  . Hearing loss Paternal Aunt   . Varicose Veins Paternal Aunt   . Arthritis Maternal Grandmother   . Arthritis Paternal Grandfather   . Hearing loss Paternal Grandfather     Allergies  Allergen Reactions  . Contrast Media [Iodinated Diagnostic Agents]   . Iohexol      Code: HIVES, Desc: 1 hive developed on hand post injection  of 125cc's Omni 300., Onset Date: [id number]   . Penicillins Other (See Comments)    unknown    Current Outpatient Prescriptions on File Prior to Visit  Medication Sig Dispense Refill  . ALPRAZolam (XANAX) 0.5 MG tablet Take 1/2 to a whole tablet as needed.    . doxycycline (DORYX) 100 MG DR capsule Take 100 mg by mouth 2 (two) times daily.    Marland Kitchen escitalopram (LEXAPRO) 10 MG tablet Take 10 mg by mouth daily.     . fexofenadine (ALLEGRA) 180 MG tablet Take 180 mg by mouth daily.    . fluticasone (FLONASE) 50 MCG/ACT nasal spray Place 1 spray into the nose daily.    . montelukast (SINGULAIR) 10 MG tablet Take 10 mg by mouth at bedtime.    . Multiple Vitamin (MULTIVITAMIN) tablet Take 1 tablet by mouth daily.      Marland Kitchen spironolactone (ALDACTONE) 100 MG tablet Take 100 mg by  mouth daily.     . traZODone (DESYREL) 50 MG tablet Take 1/4 tab qhs prn    . zaleplon (SONATA) 10 MG capsule Take 10-20 mg by mouth at bedtime as needed. For sleep      No current facility-administered medications on file prior to visit.    BP 110/78 mmHg  Pulse 93  Temp(Src) 99 F (37.2 C) (Oral)  Ht 5\' 5"  (1.651 m)  Wt 175 lb 4.8 oz (79.516 kg)  BMI 29.17 kg/m2  SpO2 97%       Objective:   Physical Exam  Constitutional: She is oriented to person, place, and time. She appears well-developed and well-nourished. No distress.  HENT:  Head: Normocephalic and atraumatic.  Right Ear: External ear normal.  Left Ear: External ear normal.  Nose: Nose normal.  Mouth/Throat: Oropharynx is clear and moist. No oropharyngeal exudate.  No signs of infection in bilateral ears. TM's visualzied  No pain with palpation to sinus cavities  Eyes: Conjunctivae and EOM are normal. Pupils are equal, round, and reactive to light. Right eye exhibits no discharge. Left eye exhibits no discharge.  Neck: Normal range of motion. Neck supple. No thyromegaly present.  Cardiovascular: Normal rate, regular rhythm, normal heart sounds and intact distal pulses.  Exam reveals no gallop and no friction rub.   No murmur heard. Pulmonary/Chest: Effort normal and breath sounds normal. No respiratory distress. She has no wheezes. She has no rales. She exhibits no tenderness.  Musculoskeletal: Normal range of motion. She exhibits no edema or tenderness.  Lymphadenopathy:    She has no cervical adenopathy.  Neurological: She is alert and oriented to person, place, and time.  Skin: Skin is warm and dry. No rash noted. She is not diaphoretic. No erythema. No pallor.  Psychiatric: She has a normal mood and affect. Her behavior is normal. Judgment and thought content normal.  Nursing note and vitals reviewed.      Assessment & Plan:  1. Sore throat - Likely due to cough.  - POC Rapid Strep A - Throat culture  ([facility]) - Use honey or warm saltwater gargles to help relieve sore throat 2. Acute bronchitis, unspecified organism - Likely viral in nature. No concern for PNA at this time.  - predniSONE (DELTASONE) 20 MG tablet; Take 1 tablet (20 mg total) by mouth daily with breakfast.  Dispense: 9 tablet; Refill: 0 - HYDROcodone-homatropine (HYCODAN) 5-1.5 MG/5ML syrup; Take 5 mLs by mouth every 8 (eight) hours as needed for cough.  Dispense: 120 mL; Refill: 0 - Mucinex or delsym  during the day.   3. Other migraine without status migrainosus, intractable - SUMAtriptan (IMITREX) 50 MG tablet; Take 1 tablet (50 mg total) by mouth every 2 (two) hours as needed for migraine. May repeat in 2 hours if headache persists or recurs.  Dispense: 10 tablet; Refill: 0

## 2015-02-25 NOTE — Patient Instructions (Addendum)
Your exam is consistent with the early stages of bronchitis.   Please take the prednisone as directed  Day 1 40 mg Day 2 40 mg Day 3 40 mg Day 4 20 mg Day 5 20 mg Day 6 20 mg  Use the cough syrup at night.   Use Mucinex during the day to help with the cough.   Drink plenty of fluid and get rest.   Enjoy your weekend in [place].

## 2015-02-25 NOTE — Progress Notes (Signed)
Pre visit review using our clinic review tool, if applicable. No additional management support is needed unless otherwise documented below in the visit note. 

## 2015-02-27 LAB — CULTURE, GROUP A STREP: ORGANISM ID, BACTERIA: NORMAL

## 2016-01-26 ENCOUNTER — Other Ambulatory Visit: Payer: Self-pay | Admitting: Obstetrics & Gynecology

## 2016-01-26 DIAGNOSIS — N644 Mastodynia: Secondary | ICD-10-CM

## 2016-01-28 ENCOUNTER — Ambulatory Visit
Admission: RE | Admit: 2016-01-28 | Discharge: 2016-01-28 | Disposition: A | Discharge status: Home / Self Care | Payer: BLUE CROSS/BLUE SHIELD | Source: Ambulatory Visit | Attending: Obstetrics & Gynecology | Admitting: Obstetrics & Gynecology

## 2016-01-28 DIAGNOSIS — N644 Mastodynia: Secondary | ICD-10-CM

## 2016-10-13 ENCOUNTER — Ambulatory Visit (INDEPENDENT_AMBULATORY_CARE_PROVIDER_SITE_OTHER): Payer: BLUE CROSS/BLUE SHIELD | Admitting: Internal Medicine

## 2016-10-13 ENCOUNTER — Encounter: Payer: Self-pay | Admitting: Internal Medicine

## 2016-10-13 VITALS — BP 104/62 | HR 86 | Temp 98.3°F | Wt 142.8 lb

## 2016-10-13 DIAGNOSIS — M542 Cervicalgia: Secondary | ICD-10-CM | POA: Diagnosis not present

## 2016-10-13 DIAGNOSIS — S0990XA Unspecified injury of head, initial encounter: Secondary | ICD-10-CM | POA: Diagnosis not present

## 2016-10-13 DIAGNOSIS — G8929 Other chronic pain: Secondary | ICD-10-CM | POA: Diagnosis not present

## 2016-10-13 DIAGNOSIS — S060X0A Concussion without loss of consciousness, initial encounter: Secondary | ICD-10-CM

## 2016-10-13 NOTE — Patient Instructions (Signed)
Your exam is reassuring.  Today but  I agree    You have a mild concussion and it is possible that  Could trigger headaches before resolving .  Take it easy .   For now .   Ok to take the trazodone at night   For now.    Plan ROV in 2 weeks  Or as needed   don't think a scan will be helpful at this time.     Head Injury, Adult There are many types of head injuries. Head injuries can be as minor as a bump, or they can be more severe. More severe head injuries include:  A jarring injury to the brain (concussion).  A bruise of the brain (contusion). This means there is bleeding in the brain that can cause swelling.  A cracked skull (skull fracture).  Bleeding in the brain that collects, clots, and forms a bump (hematoma). After a head injury, you may need to be observed for a while in the emergency department or urgent care. Sometimes admission to the hospital is needed. After a head injury has happened, most problems occur within the first 24 hours, but side effects may occur up to 7-10 days after the injury. It is important to watch your condition for any changes. What are the causes? There are many possible causes of a head injury. A serious head injury may happen to someone who is in a car accident (motor vehicle collision). Other causes of major head injuries include bicycle or motorcycle accidents, sports injuries, and falls. Risk factors This condition is more likely to occur in people who:  Drink a lot of alcohol or use drugs.  Are over the age of 94.  Are at risk for falls. What are the symptoms? There are many possible symptoms of a head injury. Visible symptoms of a head injury include a bruise, bump, or bleeding at the site of the injury. Other non-visible symptoms include:  Feeling sleepy or not being able to stay awake.  Passing out.  Headache.  Seizures.  Dizziness.  Confusion.  Memory problems.  Nausea or vomiting. Other possible symptoms that may develop  after the head injury include:  Poor attention and concentration.  Fatigue or tiring easily.  Irritability.  Being uncomfortable around bright lights or loud noises.  Anxiety or depression.  Disturbed sleep. How is this diagnosed? This condition can usually be diagnosed based on your symptoms, a description of the injury, and a physical exam. You may also have imaging tests done, such as a CT scan or MRI. You will also be closely watched. How is this treated? Treatment for this condition depends on the severity and type of injury you have. The main goal of treatment is to prevent complications and allow the brain time to heal. For mild head injury, you may be sent home and treatment may include:  Observation. A responsible adult should stay with you for 24 hours after your injury and check on you often.  Physical rest.  Brain rest.  Pain medicines. For severe brain injury, treatment may include:  Close observation. This includes hospitalization with frequent physical exams. You may need to go to a hospital that specializes in head injury.  Pain medicines.  Breathing support. This may include using a ventilator.  Managing the pressure inside the brain (intracranial pressure, or ICP). This may include:  Monitoring the ICP.  Giving medicines to decrease the ICP.  Positioning you to decrease the ICP.  Medicine to prevent seizures.  Surgery to stop bleeding or to remove blood clots (craniotomy).  Surgery to remove part of the skull (decompressive craniectomy). This allows room for the brain to swell. Follow these instructions at home: Activity  Rest as much as possible and avoid activities that are physically hard or tiring.  Make sure you get enough sleep.  Limit activities that require a lot of thought or attention, such as:  Watching TV.  Playing memory games and puzzles.  Job-related work or homework.  Working on [employer], and  texting.  Avoid activities that could cause another head injury, such as playing sports, until your health care provider approves. Having another head injury, especially before the first one has healed, can be dangerous.  Ask your health care provider when it is safe for you to return to your regular activities, including work or school. Ask your health care provider for a step-by-step plan for gradually returning to activities.  Ask your health care provider when you can drive, ride a bicycle, or use heavy machinery. Your ability to react may be slower after a brain injury. Never do these activities if you are dizzy. Lifestyle  Do not drink alcohol until your health care provider approves, and avoid drug use. Alcohol and certain drugs may slow your recovery and can put you at risk of further injury.  If it is harder than usual to remember things, write them down.  If you are easily distracted, try to do one thing at a time.  Talk with family members or close friends when making important decisions.  Tell your friends, family, a trusted colleague, and work Freight forwarder about your injury, symptoms, and restrictions. Have them watch for any new or worsening problems. General instructions  Take over-the-counter and prescription medicines only as told by your health care provider.  Have someone stay with you for 24 hours after your head injury. This person should watch you for any changes in your symptoms and be ready to seek medical help, as needed.  Keep all follow-up visits as told by your health care provider. This is important. Prevention  Work on improving your balance and strength to avoid falls.  Wear a seatbelt when you are in a moving vehicle.  Wear a helmet when riding a bicycle, skiing, or doing any other sport or activity that has a risk of injury.  Drink alcohol only in moderation.  Take safety measures in your home, such as:  Removing clutter and tripping hazards from  floors and stairways.  Using grab bars in bathrooms and handrails by stairs.  Placing non-slip mats on floors and in bathtubs.  Improving lighting in dim areas. Get help right away if:  You have:  A severe headache that is not helped by medicine.  Trouble walking, have weakness in your arms and legs, or lose your balance.  Clear or bloody fluid coming from your nose or ears.  Changes in your vision.  A seizure.  You vomit.  Your symptoms get worse.  Your speech is slurred.  You pass out.  You are sleepier and have trouble staying awake.  Your pupils change size. These symptoms may represent a serious problem that is an emergency. Do not wait to see if the symptoms will go away. Get medical help right away. Call your local emergency services (911 in the [place] Do not drive yourself to the hospital. This information is not intended to replace advice given to you by your health care provider. Make sure you  discuss any questions you have with your health care provider. Document Released: 05/03/2005 Document Revised: 11/28/2015 Document Reviewed: 11/11/2015 Elsevier Interactive Patient Education  2017 [employer].

## 2016-10-13 NOTE — Progress Notes (Signed)
Chief Complaint  Patient presents with  . Acute Visit    hit in head with basketball     HPI: Jordan Mayer 46 y.o.  sda     Onset  Injury  About 5 pm yesterday  And hit from basket ball   To back of head  Right .  When on sidelines at her childs middle school game  Hit ? r posterior and shocked and head hyperflexed   Had pain mid back up to head and frontal   No vision change  Was hard to focus on  Phone   But no loc.  Feels like  Concussion like in past   Hard to focus  But no diplopia  Not well but no vomiting   But has had nausea.    No fallin g.  hsa udnerlyin g b neck issues dr Saintclair Halsted seen Pt and dry needling with help .   falred this up anyway  No new weakness legs arms   Feels off   Balance no falling  Pressure behing righ side of eye and face   ROS: See pertinent positives and negatives per HPI. No fever  Falling bleeding slow to speak   Past Medical History:  Diagnosis Date  . ADD (attention deficit disorder)   . Allergy   . Asthma   . Chicken pox   . Depression   . PCOS (polycystic ovarian syndrome)   . 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 Shingles     Family History  Problem Relation Age of Onset  . Varicose Veins Mother   . Asthma Mother   . Varicose Veins Sister   . Birth defects Maternal Aunt        Cognitively Challenged  . Miscarriages / Stillbirths Maternal Aunt   . Varicose Veins Maternal Aunt   . Cancer Paternal Aunt        Melanoma  . Hearing loss Paternal Aunt   . Varicose Veins Paternal Aunt   . Arthritis Maternal Grandmother   . Arthritis Paternal Grandfather   . Hearing loss Paternal Grandfather     Social History   Social History  . Marital status: Married    Spouse name: N/A  . Number of children: N/A  . Years of education: N/A   Occupational History  . Nurse, adult    Social History Main Topics  . Smoking status: Never Smoker  . Smokeless tobacco: Never Used  . Alcohol use 0.0 oz/week     Comment: occasional  . Drug use: No   . Sexual activity: Yes    Birth control/ protection: IUD   Other Topics Concern  . Not on file   Social History Narrative   7 hours of sleep per night   Lives with her husband and 2 kids   Does not work/Full Time Volunteer   Has a dog in the home       EXAM:  BP 104/62   Pulse 86   Temp 98.3 F (36.8 C)   Wt 142 lb 12.8 oz (64.8 kg)   BMI 23.76 kg/m   Body mass index is 23.76 kg/m.  GENERAL: vitals reviewed and listed above, alert, oriented, appears well hydrated and in no acute distress  Cautious turning of neck   Slower speech but coherent and appropriate  HEENT: no deformity , conjunctiva  clear, no obvious abnormalities on inspection of external nose and ears tmx clear  No bruising OP : no lesion edema or exudate  Tongue midline  NECK: no obvious masses on inspection palpation  Neg Spurling   No bruising  LUNGS: clear to auscultation bilaterally, no wheezes, rales or rhonchi, good air movement CV: HRRR, no clubbing cyanosis or  peripheral edema nl cap refill  Neuro  dtrs present  Nl heel to toe  Neg rhonberg  Finger to nose  MS: moves all extremities without noticeable focal  abnormality PSYCH: pleasant and cooperative, no obvious depression or anxiety  ASSESSMENT AND PLAN:  Discussed the following assessment and plan:  Head injuries, initial encounter  Concussion without loss of consciousness, initial encounter  Chronic neck pain - under pt etc rx  Closed head injury on top of ongoing cervical spine difficulties. No alarm symptoms today she most likely has a mild concussion based on her other symptomatology. We discussed follow-up avoidance of injury activity as tolerated with relative rest. I don't see a need for imaging at this time but close follow-up appropriate follow-up in 2 weeks or as needed if she was doing well she can call us in the meantime Warned her that even a minor closed 10 injury can trigger migraine or other headaches that can be managed in  a different way She can go ahead and take the trazodone at night as prescribed by her behavioral health provider if not helpful we may switch over to Elavil. Reassurance at this time of the findings. No weakness spinal cord symptomatology. -Patient advised to return or notify health care team  if symptoms worsen ,persist or new concerns arise.  Patient Instructions  Your exam is reassuring.  Today but  I agree    You have a mild concussion and it is possible that  Could trigger headaches before resolving .  Take it easy .   For now .   Ok to take the trazodone at night   For now.    Plan ROV in 2 weeks  Or as needed   don't think a scan will be helpful at this time.     Head Injury, Adult There are many types of head injuries. Head injuries can be as minor as a bump, or they can be more severe. More severe head injuries include:  A jarring injury to the brain (concussion).  A bruise of the brain (contusion). This means there is bleeding in the brain that can cause swelling.  A cracked skull (skull fracture).  Bleeding in the brain that collects, clots, and forms a bump (hematoma). After a head injury, you may need to be observed for a while in the emergency department or urgent care. Sometimes admission to the hospital is needed. After a head injury has happened, most problems occur within the first 24 hours, but side effects may occur up to 7-10 days after the injury. It is important to watch your condition for any changes. What are the causes? There are many possible causes of a head injury. A serious head injury may happen to someone who is in a car accident (motor vehicle collision). Other causes of major head injuries include bicycle or motorcycle accidents, sports injuries, and falls. Risk factors This condition is more likely to occur in people who:  Drink a lot of alcohol or use drugs.  Are over the age of 100.  Are at risk for falls. What are the symptoms? There are many  possible symptoms of a head injury. Visible symptoms of a head injury include a bruise, bump, or bleeding at the site of the injury. Other  non-visible symptoms include:  Feeling sleepy or not being able to stay awake.  Passing out.  Headache.  Seizures.  Dizziness.  Confusion.  Memory problems.  Nausea or vomiting. Other possible symptoms that may develop after the head injury include:  Poor attention and concentration.  Fatigue or tiring easily.  Irritability.  Being uncomfortable around bright lights or loud noises.  Anxiety or depression.  Disturbed sleep. How is this diagnosed? This condition can usually be diagnosed based on your symptoms, a description of the injury, and a physical exam. You may also have imaging tests done, such as a CT scan or MRI. You will also be closely watched. How is this treated? Treatment for this condition depends on the severity and type of injury you have. The main goal of treatment is to prevent complications and allow the brain time to heal. For mild head injury, you may be sent home and treatment may include:  Observation. A responsible adult should stay with you for 24 hours after your injury and check on you often.  Physical rest.  Brain rest.  Pain medicines. For severe brain injury, treatment may include:  Close observation. This includes hospitalization with frequent physical exams. You may need to go to a hospital that specializes in head injury.  Pain medicines.  Breathing support. This may include using a ventilator.  Managing the pressure inside the brain (intracranial pressure, or ICP). This may include:  Monitoring the ICP.  Giving medicines to decrease the ICP.  Positioning you to decrease the ICP.  Medicine to prevent seizures.  Surgery to stop bleeding or to remove blood clots (craniotomy).  Surgery to remove part of the skull (decompressive craniectomy). This allows room for the brain to swell. Follow  these instructions at home: Activity  Rest as much as possible and avoid activities that are physically hard or tiring.  Make sure you get enough sleep.  Limit activities that require a lot of thought or attention, such as:  Watching TV.  Playing memory games and puzzles.  Job-related work or homework.  Working on [employer], and texting.  Avoid activities that could cause another head injury, such as playing sports, until your health care provider approves. Having another head injury, especially before the first one has healed, can be dangerous.  Ask your health care provider when it is safe for you to return to your regular activities, including work or school. Ask your health care provider for a step-by-step plan for gradually returning to activities.  Ask your health care provider when you can drive, ride a bicycle, or use heavy machinery. Your ability to react may be slower after a brain injury. Never do these activities if you are dizzy. Lifestyle  Do not drink alcohol until your health care provider approves, and avoid drug use. Alcohol and certain drugs may slow your recovery and can put you at risk of further injury.  If it is harder than usual to remember things, write them down.  If you are easily distracted, try to do one thing at a time.  Talk with family members or close friends when making important decisions.  Tell your friends, family, a trusted colleague, and work Freight forwarder about your injury, symptoms, and restrictions. Have them watch for any new or worsening problems. General instructions  Take over-the-counter and prescription medicines only as told by your health care provider.  Have someone stay with you for 24 hours after your head injury. This person should watch you for  any changes in your symptoms and be ready to seek medical help, as needed.  Keep all follow-up visits as told by your health care provider. This is  important. Prevention  Work on improving your balance and strength to avoid falls.  Wear a seatbelt when you are in a moving vehicle.  Wear a helmet when riding a bicycle, skiing, or doing any other sport or activity that has a risk of injury.  Drink alcohol only in moderation.  Take safety measures in your home, such as:  Removing clutter and tripping hazards from floors and stairways.  Using grab bars in bathrooms and handrails by stairs.  Placing non-slip mats on floors and in bathtubs.  Improving lighting in dim areas. Get help right away if:  You have:  A severe headache that is not helped by medicine.  Trouble walking, have weakness in your arms and legs, or lose your balance.  Clear or bloody fluid coming from your nose or ears.  Changes in your vision.  A seizure.  You vomit.  Your symptoms get worse.  Your speech is slurred.  You pass out.  You are sleepier and have trouble staying awake.  Your pupils change size. These symptoms may represent a serious problem that is an emergency. Do not wait to see if the symptoms will go away. Get medical help right away. Call your local emergency services (911 in the [place] Do not drive yourself to the hospital. This information is not intended to replace advice given to you by your health care provider. Make sure you discuss any questions you have with your health care provider. Document Released: 05/03/2005 Document Revised: 11/28/2015 Document Reviewed: 11/11/2015 Elsevier Interactive Patient Education  2017 Rogers K. Panosh M.D.

## 2016-10-26 NOTE — Progress Notes (Signed)
Chief Complaint  Patient presents with  . Follow-up    HPI: Jordan Mayer 46 y.o.   Fu head injury  Aggravating neck condition  Feels about 75 % back to basline   For a while very drowsy and thinkin g very slowly and dec word finding. No fall. Getting better in past wek   Awakening headache like a hangover   Not away no debility .   woul dlike  rx med incase migraines come back  Hx of same in past .  Had imitrex? Feels off but balance better  No focal weakness or  Neck as expected  Taking 100 mg trazadone at night  Max up to 200 ROS: See pertinent positives and negatives per HPI.  Past Medical History:  Diagnosis Date  . ADD (attention deficit disorder)   . Allergy   . Asthma   . Chicken pox   . Depression   . PCOS (polycystic ovarian syndrome)   . 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 Shingles     Family History  Problem Relation Age of Onset  . Varicose Veins Mother   . Asthma Mother   . Varicose Veins Sister   . Birth defects Maternal Aunt        Cognitively Challenged  . Miscarriages / Stillbirths Maternal Aunt   . Varicose Veins Maternal Aunt   . Cancer Paternal Aunt        Melanoma  . Hearing loss Paternal Aunt   . Varicose Veins Paternal Aunt   . Arthritis Maternal Grandmother   . Arthritis Paternal Grandfather   . Hearing loss Paternal Grandfather     Social History   Social History  . Marital status: Married    Spouse name: N/A  . Number of children: N/A  . Years of education: N/A   Occupational History  . Nurse, adult    Social History Main Topics  . Smoking status: Never Smoker  . Smokeless tobacco: Never Used  . Alcohol use 0.0 oz/week     Comment: occasional  . Drug use: No  . Sexual activity: Yes    Birth control/ protection: IUD   Other Topics Concern  . None   Social History Narrative   7 hours of sleep per night   Lives with her husband and 2 kids   Does not work/Full Time Volunteer   Has a dog in the home    Outpatient  Medications Prior to Visit  Medication Sig Dispense Refill  . escitalopram (LEXAPRO) 10 MG tablet Take 10 mg by mouth daily.     . fexofenadine (ALLEGRA) 180 MG tablet Take 180 mg by mouth daily.    . fluticasone (FLONASE) 50 MCG/ACT nasal spray Place 1 spray into the nose daily.    . montelukast (SINGULAIR) 10 MG tablet Take 10 mg by mouth at bedtime.    . Multiple Vitamin (MULTIVITAMIN) tablet Take 1 tablet by mouth daily.      Marland Kitchen spironolactone (ALDACTONE) 100 MG tablet Take 100 mg by mouth daily.     . traZODone (DESYREL) 50 MG tablet Take 1/4 tab qhs prn    . SUMAtriptan (IMITREX) 50 MG tablet Take 1 tablet (50 mg total) by mouth every 2 (two) hours as needed for migraine. May repeat in 2 hours if headache persists or recurs. 10 tablet 0   No facility-administered medications prior to visit.      EXAM:  BP 100/70 (BP Location: Right Arm, Patient Position: Sitting, Cuff Size: Normal)  Pulse 84   Temp 98.5 F (36.9 C) (Oral)   Wt 143 lb 12.8 oz (65.2 kg)   SpO2 98%   BMI 23.93 kg/m   Body mass index is 23.93 kg/m.  GENERAL: vitals reviewed and listed above, alert, oriented, appears well hydrated and in no acute distress HEENT: atraumatic, conjunctiva  clear, no obvious abnormalities on inspection of external nose and ears NECK: no obvious masses on inspection palpation   gait nl and easy  Neg Romberg but feels off  No drift  No obv focal neuro findings   Speech and affect nl and appropriate MS: moves all extremities without noticeable focal  abnormality PSYCH: pleasant and cooperative, no obvious depression or anxiety  ASSESSMENT AND PLAN:  Discussed the following assessment and plan:  Concussion without loss of consciousness, sequela (HCC)  Traumatic injury of head, sequela  Chronic neck pain  History of migraine headaches - Plan: SUMAtriptan (IMITREX) 50 MG tablet, DISCONTINUED: SUMAtriptan (IMITREX) 50 MG tablet   Expectant management. Expect continued improvement  and  Avoid   further trauma   Return if not better baseline in a month or  Worse   Pt familiar with return to play protocols  From children in sports .  -Patient advised to return or notify health care team  if symptoms worsen ,persist or new concerns arise.  Patient Instructions  I am glad that you are improved at this time.   Still progres slowly and activity as tolerated .   IF not back to baseline in another month then contact us or if worse at all concerns.   ROV in 1 month if needed as discussed .   Will  Renew your migraine med in  case needed.          Standley Brooking. Jelani Vreeland M.D.

## 2016-10-27 ENCOUNTER — Ambulatory Visit (INDEPENDENT_AMBULATORY_CARE_PROVIDER_SITE_OTHER): Payer: BLUE CROSS/BLUE SHIELD | Admitting: Internal Medicine

## 2016-10-27 ENCOUNTER — Encounter: Payer: Self-pay | Admitting: Internal Medicine

## 2016-10-27 VITALS — BP 100/70 | HR 84 | Temp 98.5°F | Wt 143.8 lb

## 2016-10-27 DIAGNOSIS — G8929 Other chronic pain: Secondary | ICD-10-CM | POA: Diagnosis not present

## 2016-10-27 DIAGNOSIS — M542 Cervicalgia: Secondary | ICD-10-CM | POA: Diagnosis not present

## 2016-10-27 DIAGNOSIS — Z8669 Personal history of other diseases of the nervous system and sense organs: Secondary | ICD-10-CM

## 2016-10-27 DIAGNOSIS — S0990XS Unspecified injury of head, sequela: Secondary | ICD-10-CM | POA: Diagnosis not present

## 2016-10-27 DIAGNOSIS — S060X0S Concussion without loss of consciousness, sequela: Secondary | ICD-10-CM | POA: Diagnosis not present

## 2016-10-27 MED ORDER — SUMATRIPTAN SUCCINATE 50 MG PO TABS: 50.0000 mg | ORAL_TABLET | ORAL | 0 refills | Status: DC | PRN

## 2016-10-27 NOTE — Patient Instructions (Addendum)
I am glad that you are improved at this time.   Still progres slowly and activity as tolerated .   IF not back to baseline in another month then contact us or if worse at all concerns.   ROV in 1 month if needed as discussed .   Will  Renew your migraine med in  case needed.

## 2017-01-27 ENCOUNTER — Encounter (HOSPITAL_COMMUNITY): Payer: Self-pay | Admitting: *Deleted

## 2017-01-27 ENCOUNTER — Telehealth: Payer: Self-pay | Admitting: Internal Medicine

## 2017-01-27 DIAGNOSIS — R51 Headache: Secondary | ICD-10-CM | POA: Insufficient documentation

## 2017-01-27 DIAGNOSIS — J45909 Unspecified asthma, uncomplicated: Secondary | ICD-10-CM | POA: Insufficient documentation

## 2017-01-27 DIAGNOSIS — Z79899 Other long term (current) drug therapy: Secondary | ICD-10-CM | POA: Diagnosis not present

## 2017-01-27 NOTE — Telephone Encounter (Addendum)
Pt needs note fax attn Janett Billow [phone] to [employer] [facility] [employer]. Pt had a concussion on 10-13-16 and needs a note to get credit for her membership dues.

## 2017-01-27 NOTE — ED Triage Notes (Signed)
The pt is c/o a headache for several days with nausea worse today she has taken imitrex po that has not helped  Hs of the same  lmp none

## 2017-01-28 ENCOUNTER — Emergency Department (HOSPITAL_COMMUNITY)
Admission: EM | Admit: 2017-01-28 | Discharge: 2017-01-28 | Disposition: A | Discharge status: Home / Self Care | Payer: BLUE CROSS/BLUE SHIELD | Attending: Emergency Medicine | Admitting: Emergency Medicine

## 2017-01-28 DIAGNOSIS — R519 Headache, unspecified: Secondary | ICD-10-CM

## 2017-01-28 DIAGNOSIS — R51 Headache: Secondary | ICD-10-CM

## 2017-01-28 MED ORDER — METOCLOPRAMIDE HCL 5 MG/ML IJ SOLN
10.0000 mg | Freq: Once | INTRAMUSCULAR | Status: AC
  Administered 2017-01-28: 10 mg via INTRAVENOUS
  Filled 2017-01-28: qty 2

## 2017-01-28 MED ORDER — SODIUM CHLORIDE 0.9 % IV BOLUS (SEPSIS)
1000.0000 mL | Freq: Once | INTRAVENOUS | Status: AC
  Administered 2017-01-28: 1000 mL via INTRAVENOUS

## 2017-01-28 MED ORDER — DEXAMETHASONE SODIUM PHOSPHATE 10 MG/ML IJ SOLN
10.0000 mg | Freq: Once | INTRAMUSCULAR | Status: AC
  Administered 2017-01-28: 10 mg via INTRAVENOUS
  Filled 2017-01-28: qty 1

## 2017-01-28 MED ORDER — DIPHENHYDRAMINE HCL 50 MG/ML IJ SOLN
25.0000 mg | Freq: Once | INTRAMUSCULAR | Status: AC
  Administered 2017-01-28: 25 mg via INTRAVENOUS
  Filled 2017-01-28: qty 1

## 2017-01-28 NOTE — ED Provider Notes (Signed)
[facility] DEPT Provider Note   CSN: [id number] Arrival date & time: 01/27/17  2032     History   Chief Complaint Chief Complaint  Patient presents with  . Headache    HPI Jordan Mayer is a 46 y.o. female.  Jordan Mayer is a 46 y.o. Female with a history of migraine headaches who presents to the emergency room complaining of a migraine headache today. Patient reports gradual onset of the migraine headache 3 days ago. She reports it is gradually worsened. She reports her headache changes in location. She reports she had pain through her temples and now it is mostly a frontal headache. She reports the location of the headaches can change location over the past 3 days. She took Imitrex without relief of her symptoms. She reports nausea, but no vomiting. She denies any head injury. She is not on any blood thinners. She denies fevers, neck pain, neck stiffness, numbness, tingling, weakness, changes to her vision, double vision, abdominal pain, vomiting, diarrhea, rashes, dizziness, headache injury or syncope.   The history is provided by the patient, medical records and the spouse. No language interpreter was used.  Headache   Associated symptoms include nausea. Pertinent negatives include no fever, no shortness of breath and no vomiting.    Past Medical History:  Diagnosis Date  . ADD (attention deficit disorder)   . Allergy   . Asthma   . Chicken pox   . Depression   . PCOS (polycystic ovarian syndrome)   . 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 Shingles     Patient Active Problem List   Diagnosis Date Noted  . Varicose veins of leg with complications 35/57/3220  . Varicose veins of lower extremities with other complications 25/42/7062  . Vertigo 12/23/2011  . Otalgia of both ears 09/17/2010  . SLEEP DISORDER/DISTURBANCE 09/27/2008  . FATIGUE 07/15/2008  . ADHD 12/05/2007  . HEADACHE 11/26/2007  . ALLERGIC RHINITIS 10/13/2007  . NEPHROLITHIASIS, HX OF 10/13/2007  .  NONSPECIFIC MESENTERIC LYMPHADENITIS 03/17/2007  . RENAL CALCULUS, LEFT 03/17/2007  . DEPRESSION 03/07/2007  . DYSPNEA ON EXERTION 03/07/2007    Past Surgical History:  Procedure Laterality Date  . BUNIONECTOMY    . LASER ABLATION     Vascular  . WISDOM TOOTH EXTRACTION      OB History    Gravida Para Term Preterm AB Living   2 2 1 1        SAB TAB Ectopic Multiple Live Births                   Home Medications    Prior to Admission medications   Medication Sig Start Date End Date Taking? Authorizing Provider  escitalopram (LEXAPRO) 10 MG tablet Take 10 mg by mouth daily.  12/10/13   Dennard Nip, NP  fexofenadine (ALLEGRA) 180 MG tablet Take 180 mg by mouth daily.    [provider]  fluticasone (FLONASE) 50 MCG/ACT nasal spray Place 1 spray into the nose daily.    [provider]  montelukast (SINGULAIR) 10 MG tablet Take 10 mg by mouth at bedtime.    [provider]  Multiple Vitamin (MULTIVITAMIN) tablet Take 1 tablet by mouth daily.      [provider]  spironolactone (ALDACTONE) 100 MG tablet Take 100 mg by mouth daily.     [provider]  SUMAtriptan (IMITREX) 50 MG tablet Take one at onset of migraine May repeat in 2 hours if headache persists or recurs. 10/27/16  Panosh, Standley Brooking, MD  traZODone (DESYREL) 50 MG tablet Take 1/4 tab qhs prn 11/03/13   Dennard Nip, NP    Family History Family History  Problem Relation Age of Onset  . Varicose Veins Mother   . Asthma Mother   . Varicose Veins Sister   . Birth defects Maternal Aunt        Cognitively Challenged  . Miscarriages / Stillbirths Maternal Aunt   . Varicose Veins Maternal Aunt   . Cancer Paternal Aunt        Melanoma  . Hearing loss Paternal Aunt   . Varicose Veins Paternal Aunt   . Arthritis Maternal Grandmother   . Arthritis Paternal Grandfather   . Hearing loss Paternal Grandfather     Social History Social History  Substance Use Topics  .  Smoking status: Never Smoker  . Smokeless tobacco: Never Used  . Alcohol use 0.0 oz/week     Comment: occasional     Allergies   Contrast media [iodinated diagnostic agents]; Iohexol; and Penicillins   Review of Systems Review of Systems  Constitutional: Negative for chills and fever.  HENT: Negative for congestion, ear pain and sore throat.   Eyes: Positive for photophobia. Negative for pain and visual disturbance.  Respiratory: Negative for cough and shortness of breath.   Cardiovascular: Negative for chest pain.  Gastrointestinal: Positive for nausea. Negative for abdominal pain, diarrhea and vomiting.  Genitourinary: Negative for dysuria.  Musculoskeletal: Negative for back pain, neck pain and neck stiffness.  Skin: Negative for rash.  Neurological: Positive for light-headedness and headaches. Negative for dizziness, syncope, weakness and numbness.     Physical Exam Updated Vital Signs BP 99/66   Pulse (!) 59   Temp 98.6 F (37 C) (Oral)   Resp 17   Ht 5\' 5"  (1.651 m)   Wt 62.6 kg (138 lb)   SpO2 100%   BMI 22.96 kg/m   Physical Exam  Constitutional: She is oriented to person, place, and time. She appears well-developed and well-nourished. No distress.  Nontoxic appearing.  HENT:  Head: Normocephalic and atraumatic.  Right Ear: External ear normal.  Left Ear: External ear normal.  Mouth/Throat: Oropharynx is clear and moist.  Bilateral tympanic membranes are pearly-gray without erythema or loss of landmarks.  No temporal edema or tenderness.  Eyes: Pupils are equal, round, and reactive to light. Conjunctivae and EOM are normal. Right eye exhibits no discharge. Left eye exhibits no discharge.  Neck: Normal range of motion. Neck supple.  No meningeal signs.  Cardiovascular: Normal rate, regular rhythm, normal heart sounds and intact distal pulses.  Exam reveals no gallop and no friction rub.   No murmur heard. Pulmonary/Chest: Effort normal and breath sounds  normal. No respiratory distress. She has no wheezes. She has no rales.  Abdominal: Soft. There is no tenderness. There is no guarding.  Musculoskeletal: Normal range of motion. She exhibits no edema or tenderness.  Patient is spontaneously moving all extremities in a coordinated fashion exhibiting good strength.   Lymphadenopathy:    She has no cervical adenopathy.  Neurological: She is alert and oriented to person, place, and time. No cranial nerve deficit or sensory deficit. She exhibits normal muscle tone. Coordination normal.  Patient is alert and oriented 3. Speech is clear and coherent. Cranial nerves are intact. Sensation and strength is intact to bilateral upper and lower extremities. Normal gait. No pronator drift. Finger to nose intact bilaterally. EOMs are intact. Vision is grossly intact.  Skin:  Skin is warm and dry. Capillary refill takes less than 2 seconds. No rash noted. She is not diaphoretic. No erythema. No pallor.  Psychiatric: She has a normal mood and affect. Her behavior is normal.  Nursing note and vitals reviewed.    ED Treatments / Results  Labs (all labs ordered are listed, but only abnormal results are displayed) Labs Reviewed - No data to display  EKG  EKG Interpretation None       Radiology No results found.  Procedures Procedures (including critical care time)  Medications Ordered in ED Medications  sodium chloride 0.9 % bolus 1,000 mL (1,000 mLs Intravenous New Bag/Given 01/28/17 0203)  metoCLOPramide (REGLAN) injection 10 mg (10 mg Intravenous Given 01/28/17 0206)  diphenhydrAMINE (BENADRYL) injection 25 mg (25 mg Intravenous Given 01/28/17 0205)  dexamethasone (DECADRON) injection 10 mg (10 mg Intravenous Given 01/28/17 0206)     Initial Impression / Assessment and Plan / ED Course  I have reviewed the triage vital signs and the nursing notes.  Pertinent labs & imaging results that were available during my care of the patient were reviewed by  me and considered in my medical decision making (see chart for details).    This is a 46 y.o. Female with a history of migraine headaches who presents to the emergency room complaining of a migraine headache today. Patient reports gradual onset of the migraine headache 3 days ago. She reports it is gradually worsened. She reports her headache changes in location. She reports she had pain through her temples and now it is mostly a frontal headache. She reports the location of the headaches can change location over the past 3 days. She took Imitrex without relief of her symptoms. She reports nausea, but no vomiting. She denies any head injury. She is not on any blood thinners. She denies fevers, neck pain, neck stiffness.  Pt HA treated and improved while in ED.  Presentation is like pts typical HA and non concerning for [facility], [facility], Meningitis, or temporal arteritis. Pt is afebrile with no focal neuro deficits, nuchal rigidity, or change in vision. Pt is to follow up with PCP to discuss prophylactic medication. Pt verbalizes understanding and is agreeable with plan to dc.  I advised the patient to follow-up with their primary care provider this week. I advised the patient to return to the emergency department with new or worsening symptoms or new concerns. The patient verbalized understanding and agreement with plan.      Final Clinical Impressions(s) / ED Diagnoses   Final diagnoses:  Bad headache    New Prescriptions New Prescriptions   No medications on file     Sharmaine Base 01/28/17 0315    Ripley Fraise, MD 01/28/17 [phone]

## 2017-01-28 NOTE — ED Notes (Signed)
Pt reports h/a x 2 days with photosensitivity but denies any n/v.  Pt is A&Ox 4.  Family at bedside

## 2017-01-28 NOTE — Telephone Encounter (Signed)
Ok to write note

## 2017-01-28 NOTE — Telephone Encounter (Signed)
Okay to write note.

## 2017-01-28 NOTE — ED Notes (Signed)
Patient called out and states she is ready to go home.  EDP aware.

## 2017-02-01 ENCOUNTER — Encounter: Payer: Self-pay | Admitting: Emergency Medicine

## 2017-02-01 NOTE — Telephone Encounter (Signed)
Spoke with patient to confirm what the note should specify before faxing over. Note has been printed and fax. Nothing further needed

## 2017-02-04 ENCOUNTER — Encounter: Payer: Self-pay | Admitting: Internal Medicine

## 2017-05-21 ENCOUNTER — Emergency Department (HOSPITAL_COMMUNITY)
Admission: EM | Admit: 2017-05-21 | Discharge: 2017-05-21 | Disposition: A | Discharge status: Home / Self Care | Payer: BLUE CROSS/BLUE SHIELD | Attending: Emergency Medicine | Admitting: Emergency Medicine

## 2017-05-21 ENCOUNTER — Emergency Department (HOSPITAL_COMMUNITY): Payer: BLUE CROSS/BLUE SHIELD

## 2017-05-21 ENCOUNTER — Other Ambulatory Visit: Payer: Self-pay

## 2017-05-21 ENCOUNTER — Encounter (HOSPITAL_COMMUNITY): Payer: Self-pay | Admitting: *Deleted

## 2017-05-21 DIAGNOSIS — E282 Polycystic ovarian syndrome: Secondary | ICD-10-CM | POA: Insufficient documentation

## 2017-05-21 DIAGNOSIS — K8020 Calculus of gallbladder without cholecystitis without obstruction: Secondary | ICD-10-CM

## 2017-05-21 DIAGNOSIS — R110 Nausea: Secondary | ICD-10-CM | POA: Insufficient documentation

## 2017-05-21 DIAGNOSIS — I880 Nonspecific mesenteric lymphadenitis: Secondary | ICD-10-CM

## 2017-05-21 DIAGNOSIS — R109 Unspecified abdominal pain: Secondary | ICD-10-CM | POA: Diagnosis present

## 2017-05-21 DIAGNOSIS — R1031 Right lower quadrant pain: Secondary | ICD-10-CM | POA: Insufficient documentation

## 2017-05-21 DIAGNOSIS — R350 Frequency of micturition: Secondary | ICD-10-CM | POA: Diagnosis not present

## 2017-05-21 DIAGNOSIS — Z79899 Other long term (current) drug therapy: Secondary | ICD-10-CM | POA: Diagnosis not present

## 2017-05-21 DIAGNOSIS — J45909 Unspecified asthma, uncomplicated: Secondary | ICD-10-CM | POA: Diagnosis not present

## 2017-05-21 DIAGNOSIS — N200 Calculus of kidney: Secondary | ICD-10-CM | POA: Diagnosis not present

## 2017-05-21 HISTORY — DX: Disorder of kidney and ureter, unspecified: N28.9

## 2017-05-21 LAB — CBC WITH DIFFERENTIAL/PLATELET
BASOS ABS: 0 10*3/uL (ref 0.0–0.1)
Basophils Relative: 0 %
EOS PCT: 0 %
Eosinophils Absolute: 0 10*3/uL (ref 0.0–0.7)
HCT: 40.5 % (ref 36.0–46.0)
Hemoglobin: 13.6 g/dL (ref 12.0–15.0)
LYMPHS PCT: 6 %
Lymphs Abs: 1 10*3/uL (ref 0.7–4.0)
MCH: 28.9 pg (ref 26.0–34.0)
MCHC: 33.6 g/dL (ref 30.0–36.0)
MCV: 86.2 fL (ref 78.0–100.0)
Monocytes Absolute: 1.5 10*3/uL — ABNORMAL HIGH (ref 0.1–1.0)
Monocytes Relative: 9 %
Neutro Abs: 15.5 10*3/uL — ABNORMAL HIGH (ref 1.7–7.7)
Neutrophils Relative %: 85 %
PLATELETS: 229 10*3/uL (ref 150–400)
RBC: 4.7 MIL/uL (ref 3.87–5.11)
RDW: 12 % (ref 11.5–15.5)
WBC: 18.1 10*3/uL — AB (ref 4.0–10.5)

## 2017-05-21 LAB — COMPREHENSIVE METABOLIC PANEL
ALT: 19 U/L (ref 14–54)
ANION GAP: 12 (ref 5–15)
AST: 24 U/L (ref 15–41)
Albumin: 3.7 g/dL (ref 3.5–5.0)
Alkaline Phosphatase: 61 U/L (ref 38–126)
BILIRUBIN TOTAL: 1 mg/dL (ref 0.3–1.2)
BUN: 14 mg/dL (ref 6–20)
CO2: 22 mmol/L (ref 22–32)
Calcium: 9.3 mg/dL (ref 8.9–10.3)
Chloride: 100 mmol/L — ABNORMAL LOW (ref 101–111)
Creatinine, Ser: 1.02 mg/dL — ABNORMAL HIGH (ref 0.44–1.00)
Glucose, Bld: 110 mg/dL — ABNORMAL HIGH (ref 65–99)
POTASSIUM: 3.8 mmol/L (ref 3.5–5.1)
Sodium: 134 mmol/L — ABNORMAL LOW (ref 135–145)
TOTAL PROTEIN: 7.5 g/dL (ref 6.5–8.1)

## 2017-05-21 LAB — I-STAT CG4 LACTIC ACID, ED: Lactic Acid, Venous: 0.54 mmol/L (ref 0.5–1.9)

## 2017-05-21 LAB — URINALYSIS, ROUTINE W REFLEX MICROSCOPIC
BILIRUBIN URINE: NEGATIVE
GLUCOSE, UA: NEGATIVE mg/dL
KETONES UR: 20 mg/dL — AB
Nitrite: NEGATIVE
PH: 5 (ref 5.0–8.0)
Protein, ur: NEGATIVE mg/dL
Specific Gravity, Urine: 1.012 (ref 1.005–1.030)

## 2017-05-21 LAB — I-STAT BETA HCG BLOOD, ED (MC, WL, AP ONLY): I-stat hCG, quantitative: 6.8 m[IU]/mL — ABNORMAL HIGH (ref <5)

## 2017-05-21 LAB — POC URINE PREG, ED: Preg Test, Ur: NEGATIVE

## 2017-05-21 LAB — HCG, QUANTITATIVE, PREGNANCY: hCG, Beta Chain, Quant, S: 1 m[IU]/mL (ref <5)

## 2017-05-21 MED ORDER — KETOROLAC TROMETHAMINE 15 MG/ML IJ SOLN
15.0000 mg | Freq: Once | INTRAMUSCULAR | Status: AC
  Administered 2017-05-21: 15 mg via INTRAVENOUS
  Filled 2017-05-21: qty 1

## 2017-05-21 MED ORDER — HYDROMORPHONE HCL 1 MG/ML IJ SOLN
1.0000 mg | Freq: Once | INTRAMUSCULAR | Status: AC
Start: 2017-05-21 — End: 2017-05-21
  Administered 2017-05-21: 1 mg via INTRAVENOUS
  Filled 2017-05-21: qty 1

## 2017-05-21 MED ORDER — ONDANSETRON HCL 4 MG/2ML IJ SOLN
4.0000 mg | Freq: Once | INTRAMUSCULAR | Status: AC
  Administered 2017-05-21: 4 mg via INTRAVENOUS
  Filled 2017-05-21: qty 2

## 2017-05-21 MED ORDER — ACETAMINOPHEN 500 MG PO TABS: 1000.0000 mg | ORAL_TABLET | Freq: Three times a day (TID) | ORAL | 0 refills | Status: AC

## 2017-05-21 MED ORDER — SODIUM CHLORIDE 0.9 % IV BOLUS (SEPSIS)
1000.0000 mL | Freq: Once | INTRAVENOUS | Status: AC
  Administered 2017-05-21: 1000 mL via INTRAVENOUS

## 2017-05-21 MED ORDER — ACETAMINOPHEN 325 MG PO TABS
650.0000 mg | ORAL_TABLET | Freq: Once | ORAL | Status: AC
  Administered 2017-05-21: 650 mg via ORAL
  Filled 2017-05-21: qty 2

## 2017-05-21 MED ORDER — ONDANSETRON 4 MG PO TBDP: 4.0000 mg | ORAL_TABLET | Freq: Three times a day (TID) | ORAL | 0 refills | Status: DC | PRN

## 2017-05-21 MED ORDER — HYDROMORPHONE HCL 1 MG/ML IJ SOLN
1.0000 mg | Freq: Once | INTRAMUSCULAR | Status: AC
  Administered 2017-05-21: 1 mg via INTRAVENOUS
  Filled 2017-05-21: qty 1

## 2017-05-21 MED ORDER — ONDANSETRON 4 MG PO TBDP
4.0000 mg | ORAL_TABLET | Freq: Once | ORAL | Status: AC
  Administered 2017-05-21: 4 mg via ORAL
  Filled 2017-05-21: qty 1

## 2017-05-21 MED ORDER — HYDROCODONE-ACETAMINOPHEN 5-325 MG PO TABS: 1.0000 | ORAL_TABLET | Freq: Three times a day (TID) | ORAL | 0 refills | Status: AC | PRN

## 2017-05-21 MED ORDER — CIPROFLOXACIN HCL 500 MG PO TABS: 500.0000 mg | ORAL_TABLET | Freq: Two times a day (BID) | ORAL | 0 refills | Status: DC

## 2017-05-21 MED ORDER — CIPROFLOXACIN HCL 500 MG PO TABS
500.0000 mg | ORAL_TABLET | Freq: Once | ORAL | Status: AC
  Administered 2017-05-21: 500 mg via ORAL
  Filled 2017-05-21: qty 1

## 2017-05-21 MED ORDER — IBUPROFEN 600 MG PO TABS: 600.0000 mg | ORAL_TABLET | Freq: Four times a day (QID) | ORAL | 0 refills | Status: DC | PRN

## 2017-05-21 MED ORDER — FENTANYL CITRATE (PF) 100 MCG/2ML IJ SOLN
50.0000 ug | INTRAMUSCULAR | Status: DC | PRN
  Administered 2017-05-21: 50 ug via NASAL
  Filled 2017-05-21: qty 2

## 2017-05-21 NOTE — ED Notes (Signed)
Pt. Stated, I know I have a kidney infection but they sent me here.

## 2017-05-21 NOTE — ED Notes (Signed)
Pt requesting Tylenol

## 2017-05-21 NOTE — ED Notes (Signed)
Pt. Signed but would not transfer

## 2017-05-21 NOTE — Discharge Instructions (Signed)

## 2017-05-21 NOTE — ED Triage Notes (Signed)
Pt sent here from [facility] for kidney infection r/t R kidney stone.   Temp 99.2 with tylenol taken at 0930.

## 2017-05-21 NOTE — ED Notes (Signed)
Pt back in room, on monitor.

## 2017-05-21 NOTE — ED Provider Notes (Signed)
[facility] EMERGENCY DEPARTMENT Provider Note  CSN: [id number] Arrival date & time: 05/21/17 1104  Chief Complaint(s) Nephrolithiasis  HPI Jordan Mayer is a 47 y.o. female with a history of gastric bypass who presents to the emergency department with several days of cramping right flank pain that is now moved to the right lower quadrant.  This pain feels similar to prior episodes where she was told there were kidney stones.  She also endorses several days of frequency without dysuria.  Denies any vaginal bleeding or discharge.  Endorses some nausea without vomiting.  Endorses several loose stools in the last couple of days but no overt diarrhea.  Pain is exacerbated with movement and alleviated by lying still.  Patient endorses sick contacts with viral URI symptoms and "extra symptoms"such as vomiting.  Endorses intermittent chills.  No known fevers.  No other alleviating or aggravating factors.  Denies any other physical complaints.  HPI  Past Medical History Past Medical History:  Diagnosis Date  . ADD (attention deficit disorder)   . Allergy   . Asthma   . Chicken pox   . Depression   . PCOS (polycystic ovarian syndrome)   . Renal disorder    kidney disease  . 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 Shingles    Patient Active Problem List   Diagnosis Date Noted  . Varicose veins of leg with complications 52/77/8242  . Varicose veins of lower extremities with other complications 35/36/1443  . Vertigo 12/23/2011  . Otalgia of both ears 09/17/2010  . SLEEP DISORDER/DISTURBANCE 09/27/2008  . FATIGUE 07/15/2008  . ADHD 12/05/2007  . HEADACHE 11/26/2007  . ALLERGIC RHINITIS 10/13/2007  . NEPHROLITHIASIS, HX OF 10/13/2007  . NONSPECIFIC MESENTERIC LYMPHADENITIS 03/17/2007  . RENAL CALCULUS, LEFT 03/17/2007  . DEPRESSION 03/07/2007  . DYSPNEA ON EXERTION 03/07/2007   Home Medication(s) Prior to Admission medications   Medication Sig Start Date End Date Taking?  Authorizing Provider  Amphetamine Sulfate (EVEKEO) 10 MG TABS Take 10 mg by mouth daily.   Yes [provider]  clindamycin (CLEOCIN T) 1 % lotion Apply 1 application topically once a week.   Yes [provider]  cyanocobalamin ([employer] VITAMIN B12) 1000 MCG tablet Take 12 mg by mouth daily.   Yes [provider]  cyclobenzaprine (FLEXERIL) 10 MG tablet Take 10 mg by mouth daily as needed.   Yes [provider]  EPINEPHrine 0.3 mg/0.3 mL IJ SOAJ injection Inject 0.3 mLs into the skin daily as needed.   Yes [provider]  escitalopram (LEXAPRO) 20 MG tablet Take 20 mg by mouth daily.  12/10/13  Yes Baker, Meridith, NP  Ferrous Sulfate (IRON) 325 (65 Fe) MG TABS Take 325 mg by mouth daily.   Yes [provider]  fexofenadine (ALLEGRA) 180 MG tablet Take 180 mg by mouth daily.   Yes [provider]  fluticasone (FLONASE) 50 MCG/ACT nasal spray Place 1 spray into the nose daily.   Yes [provider]  montelukast (SINGULAIR) 10 MG tablet Take 10 mg by mouth at bedtime.   Yes [provider]  Multiple Vitamin (MULTIVITAMIN) tablet Take 1 tablet by mouth daily.     Yes [provider]  PRESCRIPTION MEDICATION Birth control   Yes [provider]  spironolactone (ALDACTONE) 100 MG tablet Take 100 mg by mouth daily.    Yes [provider]  traZODone (DESYREL) 100 MG tablet Take 200 mg by mouth at bedtime. Take 1/4 tab qhs prn  11/03/13  Yes Baker, Meridith, NP  tretinoin (RETIN-A) 0.025 % cream Apply 1 application topically daily as needed.   Yes [provider]  acetaminophen (TYLENOL) 500 MG tablet Take 2 tablets (1,000 mg total) by mouth every 8 (eight) hours for 5 days. Do not take more than 4000 mg of acetaminophen (Tylenol) in a 24-hour period. Please note that other medicines that you may be prescribed may have Tylenol as well. 05/21/17 05/26/17  Fatima Blank, MD  ciprofloxacin  (CIPRO) 500 MG tablet Take 1 tablet (500 mg total) by mouth 2 (two) times daily for 5 days. 05/21/17 05/26/17  Fatima Blank, MD  ibuprofen (ADVIL,MOTRIN) 600 MG tablet Take 1 tablet (600 mg total) by mouth every 6 (six) hours as needed. 05/21/17   Fatima Blank, MD  ondansetron (ZOFRAN ODT) 4 MG disintegrating tablet Take 1 tablet (4 mg total) by mouth every 8 (eight) hours as needed for up to 3 days for nausea or vomiting. 05/21/17 05/24/17  Fatima Blank, MD                                                                                                                                    Past Surgical History Past Surgical History:  Procedure Laterality Date  . BUNIONECTOMY    . LASER ABLATION     Vascular  . WISDOM TOOTH EXTRACTION     Family History Family History  Problem Relation Age of Onset  . Varicose Veins Mother   . Asthma Mother   . Varicose Veins Sister   . Birth defects Maternal Aunt        Cognitively Challenged  . Miscarriages / Stillbirths Maternal Aunt   . Varicose Veins Maternal Aunt   . Cancer Paternal Aunt        Melanoma  . Hearing loss Paternal Aunt   . Varicose Veins Paternal Aunt   . Arthritis Maternal Grandmother   . Arthritis Paternal Grandfather   . Hearing loss Paternal Grandfather     Social History Social History   Tobacco Use  . Smoking status: Never Smoker  . Smokeless tobacco: Never Used  Substance Use Topics  . Alcohol use: Yes    Alcohol/week: 0.0 oz    Comment: occasional  . Drug use: No   Allergies Contrast media [iodinated diagnostic agents]; Gadolinium derivatives; Iohexol; and Penicillins  Review of Systems Review of Systems All other systems are reviewed and are negative for acute change except as noted in the HPI  Physical Exam Vital Signs  I have reviewed the triage vital signs BP (!) 105/56   Pulse (!) 102   Temp (!) 101.8 F (38.8 C) (Oral)   Resp 18   Ht 5\' 5"  (1.651 m)   Wt 62.6 kg (138 lb)    LMP 05/08/2017   SpO2 96%   BMI 22.96 kg/m   Physical Exam  Constitutional: She is oriented to  person, place, and time. She appears well-developed and well-nourished. No distress.  HENT:  Head: Normocephalic and atraumatic.  Nose: Nose normal.  Eyes: Conjunctivae and EOM are normal. Pupils are equal, round, and reactive to light. Right eye exhibits no discharge. Left eye exhibits no discharge. No scleral icterus.  Neck: Normal range of motion. Neck supple.  Cardiovascular: Normal rate and regular rhythm. Exam reveals no gallop and no friction rub.  No murmur heard. Pulmonary/Chest: Effort normal and breath sounds normal. No stridor. No respiratory distress. She has no rales.  Abdominal: Soft. She exhibits no distension. There is tenderness in the right lower quadrant and suprapubic area. There is CVA tenderness (right). There is no rigidity, no rebound, no guarding, no tenderness at McBurney's point and negative Murphy's sign. No hernia.  Musculoskeletal: She exhibits no edema or tenderness.  Neurological: She is alert and oriented to person, place, and time.  Skin: Skin is warm and dry. No rash noted. She is not diaphoretic. No erythema.  Psychiatric: She has a normal mood and affect.  Vitals reviewed.   ED Results and Treatments Labs (all labs ordered are listed, but only abnormal results are displayed) Labs Reviewed  COMPREHENSIVE METABOLIC PANEL - Abnormal; Notable for the following components:      Result Value   Sodium 134 (*)    Chloride 100 (*)    Glucose, Bld 110 (*)    Creatinine, Ser 1.02 (*)    All other components within normal limits  CBC WITH DIFFERENTIAL/PLATELET - Abnormal; Notable for the following components:   WBC 18.1 (*)    Neutro Abs 15.5 (*)    Monocytes Absolute 1.5 (*)    All other components within normal limits  URINALYSIS, ROUTINE W REFLEX MICROSCOPIC - Abnormal; Notable for the following components:   Hgb urine dipstick MODERATE (*)    Ketones,  ur 20 (*)    Leukocytes, UA SMALL (*)    Bacteria, UA RARE (*)    Squamous Epithelial / LPF 0-5 (*)    All other components within normal limits  I-STAT BETA HCG BLOOD, ED ([facility], [facility], [facility] ONLY) - Abnormal; Notable for the following components:   I-stat hCG, quantitative 6.8 (*)    All other components within normal limits  HCG, QUANTITATIVE, PREGNANCY  I-STAT CG4 LACTIC ACID, ED  I-STAT CG4 LACTIC ACID, ED  POC URINE PREG, ED                                                                                                                         EKG  EKG Interpretation  Date/Time:    Ventricular Rate:    PR Interval:    QRS Duration:   QT Interval:    QTC Calculation:   R Axis:     Text Interpretation:        Radiology Ct Abdomen Pelvis Wo Contrast  Result Date: 05/21/2017 CLINICAL DATA:  Right-sided abdominal pain for 4 days. EXAM: CT ABDOMEN AND PELVIS WITHOUT  CONTRAST TECHNIQUE: Multidetector CT imaging of the abdomen and pelvis was performed following the standard protocol without IV contrast. COMPARISON:  08/15/2011 FINDINGS: Lower chest: Mild dependent changes in the lung bases. Hepatobiliary: No focal liver abnormality is seen. No gallstones, gallbladder wall thickening, or biliary dilatation. Pancreas: Unremarkable. No pancreatic ductal dilatation or surrounding inflammatory changes. Spleen: Normal in size without focal abnormality. Adrenals/Urinary Tract: No adrenal gland nodules. 3 mm nonobstructing stone in the midpole left kidney. No hydronephrosis or hydroureter. No ureteral stones. No bladder stone or bladder wall thickening. Stomach/Bowel: Postoperative changes in the stomach consistent with gastric sleeve. Small bowel and colon are decompressed with scattered stool in the colon. No wall thickening or inflammatory changes are appreciated. Appendix is normal. Vascular/Lymphatic: No aortic aneurysm or significant calcification. Prominent lymph nodes throughout the mesentery and  retroperitoneum without pathologic enlargement. Hazy fat stranding throughout the mesentery. Changes are most likely inflammatory, suggesting mesenteric adenitis or sclerosing mesenteritis. Early lymphoma could also have this appearance. Similar changes were present on the previous study, arguing against a malignant process. Reproductive: Uterus and bilateral adnexa are unremarkable. Other: No free air or free fluid in the abdomen. Abdominal wall musculature appears intact. Musculoskeletal: No acute or significant osseous findings. IMPRESSION: 1. 3 mm nonobstructing stone in the midpole left kidney. No ureteral stone or obstruction. 2. Prominent non pathologically enlarged lymph nodes throughout the mesentery and retroperitoneum with hazy fat stranding in the mesentery. Changes are likely inflammatory, suggesting mesenteric adenitis or sclerosing mesenteritis. Early lymphoma would be a less likely consideration. 3. Postoperative stomach consistent with gastric sleeve. Electronically Signed   By: Lucienne Capers M.D.   On: 05/21/2017 18:22   US Abdomen Limited Ruq  Result Date: 05/21/2017 CLINICAL DATA:  Abdominal pain for 4 days.  Fever for 2 days. EXAM: ULTRASOUND ABDOMEN LIMITED RIGHT UPPER QUADRANT COMPARISON:  CT abdomen and pelvis 05/21/2017 FINDINGS: Gallbladder: Cholelithiasis with several stones in the dependent gallbladder. Largest measures about 1.2 cm diameter. No sludge, edema, or wall thickening. Murphy's sign is negative. Common bile duct: Diameter: 4 mm, normal Liver: No focal lesion identified. Within normal limits in parenchymal echogenicity. Portal vein is patent on color Doppler imaging with normal direction of blood flow towards the liver. IMPRESSION: Cholelithiasis.  No additional changes to suggest cholecystitis. Electronically Signed   By: Lucienne Capers M.D.   On: 05/21/2017 19:37   Pertinent labs & imaging results that were available during my care of the patient were reviewed by me  and considered in my medical decision making (see chart for details).  Medications Ordered in ED Medications  ondansetron (ZOFRAN-ODT) disintegrating tablet 4 mg (4 mg Oral Given 05/21/17 1206)  acetaminophen (TYLENOL) tablet 650 mg (650 mg Oral Given 05/21/17 1418)  HYDROmorphone (DILAUDID) injection 1 mg (1 mg Intravenous Given 05/21/17 1618)  ondansetron (ZOFRAN) injection 4 mg (4 mg Intravenous Given 05/21/17 1616)  sodium chloride 0.9 % bolus 1,000 mL (0 mLs Intravenous Stopped 05/21/17 1740)  HYDROmorphone (DILAUDID) injection 1 mg (1 mg Intravenous Given 05/21/17 1844)  ciprofloxacin (CIPRO) tablet 500 mg (500 mg Oral Given 05/21/17 2024)  ketorolac (TORADOL) 15 MG/ML injection 15 mg (15 mg Intravenous Given 05/21/17 2057)  Procedures Procedures  EMERGENCY DEPARTMENT BILIARY ULTRASOUND INTERPRETATION "Study: Limited Abdominal Ultrasound of the Gallbladder and Common Bile Duct."  INDICATIONS: Abdominal pain Indication: Multiple views of the gallbladder and common bile duct were obtained in real-time with a Multi-frequency probe."  PERFORMED BY:  Myself IMAGES ARCHIVED?: Yes LIMITATIONS: Bowel gas INTERPRETATION: Cholelithiasis, Gallbladder wall normal in thickness and Common bile duct normal in size  EMERGENCY DEPARTMENT US RENAL EXAM  "Study: Limited Retroperitoneal Ultrasound of Kidneys"  INDICATIONS: Flank pain Long and short axis of both kidneys were obtained.   PERFORMED BY: Myself IMAGES ARCHIVED?: Yes LIMITATIONS: Body habitus VIEWS USED: Long axis and Short axis  INTERPRETATION: No Hydronephrosis, Right Kidney stone   (including critical care time)  Medical Decision Making / ED Course I have reviewed the nursing notes for this encounter and the patient's prior records (if available in EHR or on provided paperwork).    Right-sided abdominal pain.   Tenderness to palpation with the right flank and right lower quadrant.  Patient is febrile and mildly tachycardic but otherwise stable vital signs.  Labs revealed leukocytosis, with left shift otherwise grossly reassuring without evidence of biliary obstruction or pancreatitis.  Bedside ultrasound revealed no evidence of hydronephrosis.  Also notable for cholelithiasis without evidence of acute cholecystitis which was confirmed by formal ultrasound.  Given her elevated white blood cell count and lower abdominal symptoms, CT scan will was obtained (without contrast due to her allergy), that revealed mesenteric lymphadenopathy, left nephrolithiasis that was nonobstructing.  It did not reveal any evidence of bowel inflammation or evidence of appendicitis.  UA revealed hematuria with white blood cell count but was not definitive for urinary tract infection.  However given the patient's fever with no other possible sources, she was treated with ciprofloxacin 2 treat for urinary tract infection and GI infection.  The patient appears reasonably screened and/or stabilized for discharge and I doubt any other medical condition or other [facility] requiring further screening, evaluation, or treatment in the ED at this time prior to discharge.  The patient is safe for discharge with strict return precautions.   Final Clinical Impression(s) / ED Diagnoses Final diagnoses:  Abdominal pain  Nephrolithiasis  Right flank pain  Calculus of gallbladder without cholecystitis without obstruction  Mesenteric adenitis    Disposition: Discharge  Condition: Good  I have discussed the results, Dx and Tx plan with the patient who expressed understanding and agree(s) with the plan. Discharge instructions discussed at great length. The patient was given strict return precautions who verbalized understanding of the instructions. No further questions at time of discharge.    ED Discharge Orders        Ordered    acetaminophen  (TYLENOL) 500 MG tablet  Every 8 hours     05/21/17 2110    ibuprofen (ADVIL,MOTRIN) 600 MG tablet  Every 6 hours PRN     05/21/17 2110    ondansetron (ZOFRAN ODT) 4 MG disintegrating tablet  Every 8 hours PRN     05/21/17 2110    ciprofloxacin (CIPRO) 500 MG tablet  2 times daily     05/21/17 2110       Follow Up: Burnis Medin, MD [place] [id number] [phone]   in 3-5 days, If symptoms do not improve or  worsen     This chart was dictated using voice recognition software.  Despite best efforts to proofread,  errors can occur which can change the documentation meaning.   Dyllon Henken, Grayce Sessions,  MD 05/21/17 2117

## 2017-05-24 NOTE — Progress Notes (Signed)
Chief Complaint  Patient presents with  . Hospitalization Follow-up    Pt seen in hospital for GI infection. Pt notes improvement since being d/c home. Pt states that she is not back to her baseline yet. Pt diagnosed with Kidney stomes and gallstones. Pt having constant nausea which is being managed with medication, pt is afraid to eat d/t sour stomach.     HPI: Jordan MCCREADIE 47 y.o. come in for fu  ?  Seen in ed for  ruq flank colicky pain   And fever up to 102?   Felt by patient to possibly be a   Renal stone  1 5 19   But had adenopathy and rx for uti with leukocytosis .  Ct scan and ruq [place]  Labs done . ua had wbc and rbc but little bacteria   noculture was done on this urine but  Place on   cipro 500 bid for 5 day as and zofran  . ( initial hcg 6 and repeat was  Not detected )  No confirmed stone on right    One on left .     May have been having  Issues for a while gi   But had episodic ruq flank pain .  Marland Kitchen Sour stomach .   Issues some help with zofran took pepcid x 1 no help no vomiting   About  60 - 70 % better  But still RUQ side  pain and nausea   ffever broke yesterday .  But taking tylenol .   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  Tenderness to palpation with the right flank and right lower quadrant.  Patient is febrile and mildly tachycardic but otherwise stable vital signs.  Labs revealed leukocytosis, with left shift otherwise grossly reassuring without evidence of biliary obstruction or pancreatitis.  Bedside ultrasound revealed no evidence of hydronephrosis.  Also notable for cholelithiasis without evidence of acute cholecystitis which was confirmed by formal ultrasound.  Given her elevated white blood cell count and lower abdominal symptoms, CT scan will was obtained (without contrast due to her allergy), that revealed mesenteric lymphadenopathy, left nephrolithiasis that was nonobstructing.   It did not reveal any evidence of bowel inflammation or evidence of appendicitis.  UA revealed hematuria with white blood cell count but was not definitive for urinary tract infection.  However given the patient's fever with no other possible sources, she was treated with ciprofloxacin 2 treat for urinary tract infection and GI infection.  ROS: See pertinent positives and negatives per HPI.  Past Medical History:  Diagnosis Date  . ADD (attention deficit disorder)   . Allergy   . Asthma   . Chicken pox   . Depression   . PCOS (polycystic ovarian syndrome)   . Renal disorder    kidney disease  . 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 Shingles     Family History  Problem Relation Age of Onset  . Varicose Veins Mother   . Asthma Mother   . Varicose Veins Sister   . Birth defects Maternal Aunt        Cognitively Challenged  . Miscarriages / Stillbirths Maternal Aunt   . Varicose Veins Maternal Aunt   . Cancer Paternal Aunt        Melanoma  . Hearing loss Paternal Aunt   . Varicose Veins Paternal Aunt   . Arthritis  Maternal Grandmother   . Arthritis Paternal Grandfather   . Hearing loss Paternal Grandfather     Social History   Socioeconomic History  . Marital status: Married    Spouse name: None  . Number of children: None  . Years of education: None  . Highest education level: None  Social Needs  . Financial resource strain: None  . Food insecurity - worry: None  . Food insecurity - inability: None  . Transportation needs - medical: None  . Transportation needs - non-medical: None  Occupational History  . Occupation: Nurse, adult  Tobacco Use  . Smoking status: Never Smoker  . Smokeless tobacco: Never Used  Substance and Sexual Activity  . Alcohol use: Yes    Alcohol/week: 0.0 oz    Comment: occasional  . Drug use: No  . Sexual activity: Yes    Birth control/protection: IUD  Other Topics Concern  . None  Social History Narrative   7 hours of sleep per night    Lives with her husband and 2 kids   Does not work/Full Time Volunteer   Has a dog in the home    Outpatient Medications Prior to Visit  Medication Sig Dispense Refill  . acetaminophen (TYLENOL) 500 MG tablet Take 2 tablets (1,000 mg total) by mouth every 8 (eight) hours for 5 days. Do not take more than 4000 mg of acetaminophen (Tylenol) in a 24-hour period. Please note that other medicines that you may be prescribed may have Tylenol as well. 30 tablet 0  . Amphetamine Sulfate (EVEKEO) 10 MG TABS Take 10 mg by mouth daily.    . clindamycin (CLEOCIN T) 1 % lotion Apply 1 application topically once a week.    . cyanocobalamin ([employer] VITAMIN B12) 1000 MCG tablet Take 12 mg by mouth daily.    . cyclobenzaprine (FLEXERIL) 10 MG tablet Take 10 mg by mouth daily as needed.    Marland Kitchen EPINEPHrine 0.3 mg/0.3 mL IJ SOAJ injection Inject 0.3 mLs into the skin daily as needed.    Marland Kitchen escitalopram (LEXAPRO) 20 MG tablet Take 20 mg by mouth daily.     . Ferrous Sulfate (IRON) 325 (65 Fe) MG TABS Take 325 mg by mouth daily.    . fexofenadine (ALLEGRA) 180 MG tablet Take 180 mg by mouth daily.    . fluticasone (FLONASE) 50 MCG/ACT nasal spray Place 1 spray into the nose daily.    Marland Kitchen HYDROcodone-acetaminophen (NORCO/VICODIN) 5-325 MG tablet Take 1 tablet by mouth every 8 (eight) hours as needed for up to 5 days for severe pain (That is not improved by your scheduled acetaminophen regimen). Please do not exceed 4000 mg of acetaminophen (Tylenol) a 24-hour period. Please note that he may be prescribed additional medicine that contains acetaminophen. 15 tablet 0  . ibuprofen (ADVIL,MOTRIN) 600 MG tablet Take 1 tablet (600 mg total) by mouth every 6 (six) hours as needed. 30 tablet 0  . montelukast (SINGULAIR) 10 MG tablet Take 10 mg by mouth at bedtime.    . Multiple Vitamin (MULTIVITAMIN) tablet Take 1 tablet by mouth daily.      Marland Kitchen PRESCRIPTION MEDICATION Birth control    . spironolactone (ALDACTONE) 100 MG tablet Take 100  mg by mouth daily.     . traZODone (DESYREL) 100 MG tablet Take 200 mg by mouth at bedtime. Take 1/4 tab qhs prn    . tretinoin (RETIN-A) 0.025 % cream Apply 1 application topically daily as needed.    . ciprofloxacin (CIPRO) 500  MG tablet Take 1 tablet (500 mg total) by mouth 2 (two) times daily for 5 days. 10 tablet 0  . ondansetron (ZOFRAN-ODT) 8 MG disintegrating tablet Take 0.5 tablets by mouth every 8 (eight) hours. sublingual  0  . ondansetron (ZOFRAN ODT) 4 MG disintegrating tablet Take 1 tablet (4 mg total) by mouth every 8 (eight) hours as needed for up to 3 days for nausea or vomiting. 15 tablet 0   No facility-administered medications prior to visit.      EXAM:  BP 92/60 (BP Location: Right Arm, Patient Position: Sitting, Cuff Size: Normal)   Pulse 68   Temp 98.5 F (36.9 C) (Oral)   Wt 144 lb 9.6 oz (65.6 kg)   LMP 05/08/2017   BMI 24.06 kg/m   Body mass index is 24.06 kg/m.  GENERAL: vitals reviewed and listed above, alert, oriented, appears well hydrated and in no acute distress non icteric  HEENT: atraumatic, conjunctiva  clear, no obvious abnormalities on inspection of external nose and ears OP : no lesion edema or exudate  NECK: no obvious masses on inspection palpation  LUNGS: clear to auscultation bilaterally, no wheezes, rales or rhonchi, good air movement CV: HRRR, no clubbing cyanosis or  peripheral edema nl cap refill  Abdomen:   normal bowel sounds without hepatosplenomegaly,  Pos guard RUQ  no  rebound or masses mild  CVA tenderness MS: moves all extremities without noticeable focal  abnormality PSYCH: pleasant and cooperative, no obvious depression or anxiety Lab Results  Component Value Date   WBC 18.1 (H) 05/21/2017   HGB 13.6 05/21/2017   HCT 40.5 05/21/2017   PLT 229 05/21/2017   GLUCOSE 110 (H) 05/21/2017   CHOL 154 07/18/2008   TRIG 105 07/18/2008   HDL 31.7 (L) 07/18/2008   LDLCALC 101 (H) 07/18/2008   ALT 19 05/21/2017   AST 24  05/21/2017   NA 134 (L) 05/21/2017   K 3.8 05/21/2017   CL 100 (L) 05/21/2017   CREATININE 1.02 (H) 05/21/2017   BUN 14 05/21/2017   CO2 22 05/21/2017   TSH 1.51 07/18/2008   BP Readings from Last 3 Encounters:  05/25/17 92/60  05/21/17 101/68  01/28/17 (!) 98/56  lab review   ASSESSMENT AND PLAN:  Discussed the following assessment and plan:  RUQ pain - Plan: Ambulatory referral to General Surgery  Mesenteric adenitis - Plan: Ambulatory referral to General Surgery  Gallstones - Plan: Ambulatory referral to General Surgery  History of sleeve gastric surgery - Plan: Ambulatory referral to General Surgery  Abnormal urinalysis w leukocytosis  and fever intensify of the ruq pain  Would have thought  From gall stone but imagine and lab show no obstruction .    No urine culture to confirm a poss uti pyelo   Or  Pelvic infection    No diarrhea  c/w GE yersinia etc     She has improved but still quite tender    Continue  cipro for 10 days ( as if pyelo)  And  Fu visit next week    Can repeat ua then  Uncertain  How sleeve surgery  Effect   But will get surgical consult about the RUQ pain  Gallstones in this setting.  Close  Fu advised and will see her next week. Or as needed .  -Patient advised to return or notify health care team  if  new concerns arise.  Patient Instructions  Want you to continue   cipro  For 10  days   Total  In case a kidney infection and the fact that your fever is just breaking now .    I think your gall bladder could easily   Be  Causing the right side pain.   Can add  An acid blocker to see if helps .    Ranitidine   150 mg   otc  Or omeprazole and will refill the zofran  For the s our stomach   Make appt  For me next week.  For fu .   You will be contact about a surgery consult in the interim .          Standley Brooking. Demetrius Barrell M.D.

## 2017-05-25 ENCOUNTER — Ambulatory Visit (INDEPENDENT_AMBULATORY_CARE_PROVIDER_SITE_OTHER): Payer: BLUE CROSS/BLUE SHIELD | Admitting: Internal Medicine

## 2017-05-25 ENCOUNTER — Encounter: Payer: Self-pay | Admitting: Internal Medicine

## 2017-05-25 VITALS — BP 92/60 | HR 68 | Temp 98.5°F | Wt 144.6 lb

## 2017-05-25 DIAGNOSIS — K8020 Calculus of gallbladder without cholecystitis without obstruction: Secondary | ICD-10-CM

## 2017-05-25 DIAGNOSIS — Z903 Acquired absence of stomach [part of]: Secondary | ICD-10-CM

## 2017-05-25 DIAGNOSIS — I880 Nonspecific mesenteric lymphadenitis: Secondary | ICD-10-CM

## 2017-05-25 DIAGNOSIS — R1011 Right upper quadrant pain: Secondary | ICD-10-CM

## 2017-05-25 DIAGNOSIS — R8290 Unspecified abnormal findings in urine: Secondary | ICD-10-CM | POA: Diagnosis not present

## 2017-05-25 MED ORDER — CIPROFLOXACIN HCL 500 MG PO TABS: 500.0000 mg | ORAL_TABLET | Freq: Two times a day (BID) | ORAL | 0 refills | Status: AC

## 2017-05-25 MED ORDER — ONDANSETRON 4 MG PO TBDP: 4.0000 mg | ORAL_TABLET | Freq: Three times a day (TID) | ORAL | 1 refills | Status: AC | PRN

## 2017-05-25 NOTE — Patient Instructions (Addendum)
Want you to continue   cipro  For 10 days   Total  In case a kidney infection and the fact that your fever is just breaking now .    I think your gall bladder could easily   Be  Causing the right side pain.   Can add  An acid blocker to see if helps .    Ranitidine   150 mg   otc  Or omeprazole and will refill the zofran  For the s our stomach   Make appt  For me next week.  For fu .   You will be contact about a surgery consult in the interim .

## 2017-05-30 NOTE — Progress Notes (Signed)
Chief Complaint  Patient presents with  . Follow-up    Still having some RUQ pain, notices when wearing certain clothing and when trying to lay on side. Pt also notes some constant nausea. Pt is scheduled for surgery consult 06/08/17    HPI: Jordan Mayer 47 y.o.   Fu ru q pain  rx for uti   Has gall stones    On antibiotic and just  finshed cipro   Yesterday  And still nausea    Still has some pain  ruq and flank but better   Food avaoidance .   Trying hydration .  No fever  Still fatigue  Has appt with surgeon  Next week.    ROS: See pertinent positives and negatives per HPI.  No fever  Or vomiting but nausea   Some constipation no diarrhea   Blood uti sx  No other  Sick   Past Medical History:  Diagnosis Date  . ADD (attention deficit disorder)   . Allergy   . Asthma   . Chicken pox   . Depression   . PCOS (polycystic ovarian syndrome)   . Renal disorder    kidney disease  . 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 Shingles     Family History  Problem Relation Age of Onset  . Varicose Veins Mother   . Asthma Mother   . Varicose Veins Sister   . Birth defects Maternal Aunt        Cognitively Challenged  . Miscarriages / Stillbirths Maternal Aunt   . Varicose Veins Maternal Aunt   . Cancer Paternal Aunt        Melanoma  . Hearing loss Paternal Aunt   . Varicose Veins Paternal Aunt   . Arthritis Maternal Grandmother   . Arthritis Paternal Grandfather   . Hearing loss Paternal Grandfather     Social History   Socioeconomic History  . Marital status: Married    Spouse name: None  . Number of children: None  . Years of education: None  . Highest education level: None  Social Needs  . Financial resource strain: None  . Food insecurity - worry: None  . Food insecurity - inability: None  . Transportation needs - medical: None  . Transportation needs - non-medical: None  Occupational History  . Occupation: Nurse, adult  Tobacco Use  . Smoking status: Never Smoker    . Smokeless tobacco: Never Used  Substance and Sexual Activity  . Alcohol use: Yes    Alcohol/week: 0.0 oz    Comment: occasional  . Drug use: No  . Sexual activity: Yes    Birth control/protection: IUD  Other Topics Concern  . None  Social History Narrative   7 hours of sleep per night   Lives with her husband and 2 kids   Does not work/Full Time Volunteer   Has a dog in the home    Outpatient Medications Prior to Visit  Medication Sig Dispense Refill  . Amphetamine Sulfate (EVEKEO) 10 MG TABS Take 10 mg by mouth daily.    . clindamycin (CLEOCIN T) 1 % lotion Apply 1 application topically once a week.    . cyanocobalamin ([employer] VITAMIN B12) 1000 MCG tablet Take 12 mg by mouth daily.    . cyclobenzaprine (FLEXERIL) 10 MG tablet Take 10 mg by mouth daily as needed.    Marland Kitchen EPINEPHrine 0.3 mg/0.3 mL IJ SOAJ injection Inject 0.3 mLs into the skin daily as needed.    Marland Kitchen  escitalopram (LEXAPRO) 20 MG tablet Take 20 mg by mouth daily.     . Ferrous Sulfate (IRON) 325 (65 Fe) MG TABS Take 325 mg by mouth daily.    . fexofenadine (ALLEGRA) 180 MG tablet Take 180 mg by mouth daily.    . fluticasone (FLONASE) 50 MCG/ACT nasal spray Place 1 spray into the nose daily.    Marland Kitchen ibuprofen (ADVIL,MOTRIN) 600 MG tablet Take 1 tablet (600 mg total) by mouth every 6 (six) hours as needed. 30 tablet 0  . montelukast (SINGULAIR) 10 MG tablet Take 10 mg by mouth at bedtime.    . Multiple Vitamin (MULTIVITAMIN) tablet Take 1 tablet by mouth daily.      . ondansetron (ZOFRAN-ODT) 8 MG disintegrating tablet Take 0.5 tablets by mouth every 8 (eight) hours. sublingual  0  . PRESCRIPTION MEDICATION Birth control    . spironolactone (ALDACTONE) 100 MG tablet Take 100 mg by mouth daily.     . traZODone (DESYREL) 100 MG tablet Take 200 mg by mouth at bedtime. Take 1/4 tab qhs prn    . tretinoin (RETIN-A) 0.025 % cream Apply 1 application topically daily as needed.     No facility-administered medications prior to  visit.      EXAM:  BP 98/60 (BP Location: Right Arm, Patient Position: Sitting, Cuff Size: Normal)   Pulse 76   Temp 98.4 F (36.9 C) (Oral)   Wt 146 lb 9.6 oz (66.5 kg)   LMP 05/08/2017   BMI 24.40 kg/m   Body mass index is 24.4 kg/m.  GENERAL: vitals reviewed and listed above, alert, oriented, appears well hydrated and in no acute distress tired non toxic no icterus  HEENT: atraumatic, conjunctiva  clear, no obvious abnormalities on inspection of external nose and ears  NECK: no obvious masses on inspection palpation  LUNGS: clear to auscultation bilaterally, no wheezes, rales or rhonchi, good air movement CV: HRRR, no clubbing cyanosis or  peripheral edema nl cap refill  Abdomen:  Sof,t normal bowel sounds without hepatosplenomegaly, pos ruq and right side tenderness no rebound ? Right CVA tenderness  MS: moves all extremities without noticeable focal  abnormality PSYCH: pleasant and cooperative, no obvious depression or anxiety Lab Results  Component Value Date   WBC 18.1 (H) 05/21/2017   HGB 13.6 05/21/2017   HCT 40.5 05/21/2017   PLT 229 05/21/2017   GLUCOSE 110 (H) 05/21/2017   CHOL 154 07/18/2008   TRIG 105 07/18/2008   HDL 31.7 (L) 07/18/2008   LDLCALC 101 (H) 07/18/2008   ALT 19 05/21/2017   AST 24 05/21/2017   NA 134 (L) 05/21/2017   K 3.8 05/21/2017   CL 100 (L) 05/21/2017   CREATININE 1.02 (H) 05/21/2017   BUN 14 05/21/2017   CO2 22 05/21/2017   TSH 1.51 07/18/2008   Wt Readings from Last 3 Encounters:  06/01/17 146 lb 9.6 oz (66.5 kg)  05/25/17 144 lb 9.6 oz (65.6 kg)  05/21/17 138 lb (62.6 kg)   Wt Readings from Last 3 Encounters:  06/01/17 146 lb 9.6 oz (66.5 kg)  05/25/17 144 lb 9.6 oz (65.6 kg)  05/21/17 138 lb (62.6 kg)    ASSESSMENT AND PLAN:  Discussed the following assessment and plan:  RUQ pain - Plan: CBC with Differential/Platelet, POCT Urinalysis Dipstick (Automated), CMP  Gallstones - Plan: CBC with Differential/Platelet,  POCT Urinalysis Dipstick (Automated), CMP  Abnormal urinalysis w leukocytosis  and fever - Plan: CBC with Differential/Platelet, POCT Urinalysis Dipstick (Automated), CMP  Mesenteric adenitis  History of sleeve gastric surgery contnued  ruq sx  Acts biliary or atypical bowel infection   See ct scan   Plan repeat labs and ua   Off cipro  Expect fatigue etc to improve over net 2 weeks rov feb  Unless better and can send in message report    Fu if fever etc .  -Patient advised to return or notify health care team  if symptoms worsen ,persist or new concerns arise.  Patient Instructions  Will notify you  of labs when available. Still suspect infection and  Gallbladder  disease causing your sx.    Please let us know if having fever .   101 and above.       Standley Brooking. Nihaal Friesen M.D.

## 2017-06-01 ENCOUNTER — Encounter: Payer: Self-pay | Admitting: Internal Medicine

## 2017-06-01 ENCOUNTER — Ambulatory Visit (INDEPENDENT_AMBULATORY_CARE_PROVIDER_SITE_OTHER): Payer: BLUE CROSS/BLUE SHIELD | Admitting: Internal Medicine

## 2017-06-01 VITALS — BP 98/60 | HR 76 | Temp 98.4°F | Wt 146.6 lb

## 2017-06-01 DIAGNOSIS — R1011 Right upper quadrant pain: Secondary | ICD-10-CM

## 2017-06-01 DIAGNOSIS — I880 Nonspecific mesenteric lymphadenitis: Secondary | ICD-10-CM | POA: Diagnosis not present

## 2017-06-01 DIAGNOSIS — R8290 Unspecified abnormal findings in urine: Secondary | ICD-10-CM

## 2017-06-01 DIAGNOSIS — Z903 Acquired absence of stomach [part of]: Secondary | ICD-10-CM

## 2017-06-01 DIAGNOSIS — K8020 Calculus of gallbladder without cholecystitis without obstruction: Secondary | ICD-10-CM

## 2017-06-01 LAB — POC URINALSYSI DIPSTICK (AUTOMATED)
Bilirubin, UA: NEGATIVE
GLUCOSE UA: NEGATIVE
Ketones, UA: NEGATIVE
Leukocytes, UA: NEGATIVE
NITRITE UA: NEGATIVE
PROTEIN UA: NEGATIVE
SPEC GRAV UA: 1.025 (ref 1.010–1.025)
UROBILINOGEN UA: 0.2 U/dL
pH, UA: 6 (ref 5.0–8.0)

## 2017-06-01 LAB — CBC WITH DIFFERENTIAL/PLATELET
BASOS ABS: 0 10*3/uL (ref 0.0–0.1)
BASOS PCT: 0.3 % (ref 0.0–3.0)
EOS ABS: 0.1 10*3/uL (ref 0.0–0.7)
Eosinophils Relative: 2.2 % (ref 0.0–5.0)
HEMATOCRIT: 35.8 % — AB (ref 36.0–46.0)
HEMOGLOBIN: 12 g/dL (ref 12.0–15.0)
LYMPHS PCT: 20.8 % (ref 12.0–46.0)
Lymphs Abs: 1 10*3/uL (ref 0.7–4.0)
MCHC: 33.5 g/dL (ref 30.0–36.0)
MCV: 88.9 fl (ref 78.0–100.0)
MONO ABS: 0.6 10*3/uL (ref 0.1–1.0)
Monocytes Relative: 11.5 % (ref 3.0–12.0)
Neutro Abs: 3.1 10*3/uL (ref 1.4–7.7)
Neutrophils Relative %: 65.2 % (ref 43.0–77.0)
Platelets: 345 10*3/uL (ref 150.0–400.0)
RBC: 4.03 Mil/uL (ref 3.87–5.11)
RDW: 12.5 % (ref 11.5–15.5)
WBC: 4.8 10*3/uL (ref 4.0–10.5)

## 2017-06-01 LAB — COMPREHENSIVE METABOLIC PANEL
ALT: 42 U/L — ABNORMAL HIGH (ref 0–35)
AST: 22 U/L (ref 0–37)
Albumin: 3.6 g/dL (ref 3.5–5.2)
Alkaline Phosphatase: 55 U/L (ref 39–117)
BUN: 14 mg/dL (ref 6–23)
CHLORIDE: 104 meq/L (ref 96–112)
CO2: 32 mEq/L (ref 19–32)
CREATININE: 0.99 mg/dL (ref 0.40–1.20)
Calcium: 9.1 mg/dL (ref 8.4–10.5)
GFR: 63.94 mL/min (ref >60.00)
GLUCOSE: 99 mg/dL (ref 70–99)
POTASSIUM: 4.1 meq/L (ref 3.5–5.1)
SODIUM: 141 meq/L (ref 135–145)
TOTAL PROTEIN: 6.2 g/dL (ref 6.0–8.3)
Total Bilirubin: 0.3 mg/dL (ref 0.2–1.2)

## 2017-06-01 NOTE — Patient Instructions (Signed)
Will notify you  of labs when available. Still suspect infection and  Gallbladder  disease causing your sx.    Please let us know if having fever .   101 and above.

## 2017-06-08 ENCOUNTER — Ambulatory Visit: Payer: Self-pay | Admitting: Surgery

## 2017-06-08 NOTE — H&P (Signed)
Jordan Mayer Documented: 06/08/2017 3:30 PM Location: [facility] Patient #: [id number] DOB: 1970/11/03 Married / Language: Cleophus Molt / Race: White Female  History of Present Illness Jordan Hector MD; 06/08/2017 6:17 PM) The patient is a 47 year old female who presents for evaluation of gall stones. Note for "Gall stones": ` ` ` Patient sent for surgical consultation at the request of Dr. Shanon Ace  Chief Complaint: Abdominal pain and gallstones. Probable biliary colic.  The patient is a 47 year old female with some fever and leukocytosis and abdominal pain. Diagnosised urinary tract infection. Placed on antibiotics. Was on ciprofloxacin for 10 days. She pretty much stayed in bed. She thought someone told her to do that. No records of her being told to be supine. However her symptoms seem to improve. Also had CAT scan concerning for a left kidney stone that was nonobstructing. She's had a history of kidney stones in the past. However usually triggered by food. The 1 kidney stone remaining seem to be nonobstructing and not a cause of any symptoms. Also on the left and not the right side. 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 However she's had intermittent abdominal pains. Possible gallbladder etiology. Follow-up with her primary care physician.  She had a workup by do bariatric to consider gastric sleeve surgery. However she was felt not to be a candidate recommended medical management and nonoperative weight loss by [facility]. She ended up getting gastric sleeve done by Dr. Duke Salvia @ [facility] in [place], [place]. May 2016. She denies really heartburn or reflux. She is several years out from that. This does not feel like dysphasia. Normally can eat most foods in small volumes. However only can do a bland diet. Heavier foods bother her. She has some mild constipation for  which she occasionally uses Senokot. That's not helped as much. Usually moves her bowels every day before all this. She feels rather tired. Feels constant nausea. His like if she could only throughout she feel better. Pain in the right upper side. Hates to her back and shoulder especially.  No personal nor family history of GI/colon cancer, inflammatory bowel disease, irritable bowel syndrome, allergy such as Celiac Sprue, dietary/dairy problems, colitis, ulcers nor gastritis. No recent sick contacts/gastroenteritis. No travel outside the country. No changes in diet. No dysphagia to solids or liquids. No significant heartburn or reflux. No hematochezia, hematemesis, coffee ground emesis. No evidence of prior gastric/peptic ulceration.  (Review of systems as stated in this history (HPI) or in the review of systems. Otherwise all other 12 point ROS are negative)   Past Surgical History (Tanisha A. Owens Shark, [place]; 06/08/2017 3:30 PM) Foot Surgery Right. Oral Surgery Sleeve Gastrectomy Tonsillectomy  Diagnostic Studies History (Tanisha A. Owens Shark, [place]; 06/08/2017 3:30 PM) Colonoscopy never Mammogram within last year Pap Smear 1-5 years ago  Allergies (Tanisha A. Owens Shark, [place]; 06/08/2017 3:33 PM) Contrast Media Ready-Box *MEDICAL DEVICES AND SUPPLIES* Gadolinium Derivatives Penicillins Allergies Reconciled  Medication History (Tanisha A. Owens Shark, RMA; 06/08/2017 3:36 PM) TraZODone HCl (100MG  Tablet, Oral) Active. Ondansetron (8MG  Tablet Disint, Oral) Active. Escitalopram Oxalate (20MG  Tablet, Oral) Active. Junel FE 1/20 (1-20MG -MCG Tablet, Oral) Active. Spironolactone (100MG  Tablet, Oral) Active. Montelukast Sodium (10MG  Tablet, Oral) Active. Ibuprofen (600MG  Tablet, Oral) Active. Vitamin B12 (1000MCG Tablet ER, Oral) Active. Cyclobenzaprine HCl (10MG  Tablet, Oral) Active. EPINEPHrine (0.3MG /0.3ML Soln Auto-inj, Injection) Active. Evekeo (5MG  Tablet, Oral)  Active. Flonase (50MCG/ACT Suspension, Nasal) Active. Iron (325 (65 Fe)MG Tablet,  Oral) Active. Medications Reconciled  Social History (Tanisha A. Owens Shark, [place]; 06/08/2017 3:30 PM) Alcohol use Occasional alcohol use. Caffeine use Coffee. No drug use Tobacco use Never smoker.  Family History (Tanisha A. Owens Shark, [place]; 06/08/2017 3:30 PM) Migraine Headache Sister. Seizure disorder Family Members In General.  Pregnancy / Birth History (Tanisha A. Owens Shark, [place]; 06/08/2017 3:30 PM) Age at menarche 88 years. Contraceptive History Oral contraceptives. Gravida 2 Irregular periods Length (months) of breastfeeding >24 Maternal age 50-35 Para 2  Other Problems (Tanisha A. Owens Shark, [place]; 06/08/2017 3:30 PM) Anxiety Disorder Cholelithiasis Gastroesophageal Reflux Disease Kidney Stone Migraine Headache     Review of Systems (Tanisha A. Brown RMA; 06/08/2017 3:30 PM) General Present- Appetite Loss and Fatigue. Not Present- Chills, Fever, Night Sweats, Weight Gain and Weight Loss. Skin Present- Jaundice. Not Present- Change in Wart/Mole, Dryness, Hives, New Lesions, Non-Healing Wounds, Rash and Ulcer. HEENT Present- Seasonal Allergies. Not Present- Earache, Hearing Loss, Hoarseness, Nose Bleed, Oral Ulcers, Ringing in the Ears, Sinus Pain, Sore Throat, Visual Disturbances, Wears glasses/contact lenses and Yellow Eyes. Respiratory Present- Difficulty Breathing. Not Present- Bloody sputum, Chronic Cough, Snoring and Wheezing. Breast Not Present- Breast Mass, Breast Pain, Nipple Discharge and Skin Changes. Cardiovascular Not Present- Chest Pain, Difficulty Breathing Lying Down, Leg Cramps, Palpitations, Rapid Heart Rate, Shortness of Breath and Swelling of Extremities. Gastrointestinal Present- Abdominal Pain, Bloating, Change in Bowel Habits, Constipation, Nausea and Vomiting. Not Present- Bloody Stool, Chronic diarrhea, Difficulty Swallowing, Excessive gas, Gets full quickly at meals,  Hemorrhoids, Indigestion and Rectal Pain. Female Genitourinary Present- Frequency. Not Present- Nocturia, Painful Urination, Pelvic Pain and Urgency. Musculoskeletal Present- Back Pain, Muscle Weakness and Swelling of Extremities. Not Present- Joint Pain, Joint Stiffness and Muscle Pain. Neurological Present- Headaches and Weakness. Not Present- Decreased Memory, Fainting, Numbness, Seizures, Tingling, Tremor and Trouble walking. Psychiatric Present- Anxiety. Not Present- Bipolar, Change in Sleep Pattern, Depression, Fearful and Frequent crying.  Vitals (Tanisha A. Brown RMA; 06/08/2017 3:31 PM) 06/08/2017 3:31 PM Weight: 146 lb Height: 65in Body Surface Area: 1.73 m Body Mass Index: 24.3 kg/m  Temp.: 98.110F  Pulse: 78 (Regular)  BP: 112/68 (Sitting, Left Arm, Standard)      Physical Exam Jordan Hector MD; 06/08/2017 4:09 PM)  General Mental Status-Alert. General Appearance-Not in acute distress, Not Sickly. Orientation-Oriented X3. Hydration-Well hydrated. Voice-Normal.  Integumentary Global Assessment Upon inspection and palpation of skin surfaces of the - Axillae: non-tender, no inflammation or ulceration, no drainage. and Distribution of scalp and body hair is normal. General Characteristics Temperature - normal warmth is noted.  Head and Neck Head-normocephalic, atraumatic with no lesions or palpable masses. Face Global Assessment - atraumatic, no absence of expression. Neck Global Assessment - no abnormal movements, no bruit auscultated on the right, no bruit auscultated on the left, no decreased range of motion, non-tender. Trachea-midline. Thyroid Gland Characteristics - non-tender.  Eye Eyeball - Left-Extraocular movements intact, No Nystagmus. Eyeball - Right-Extraocular movements intact, No Nystagmus. Cornea - Left-No Hazy. Cornea - Right-No Hazy. Sclera/Conjunctiva - Left-No scleral icterus, No  Discharge. Sclera/Conjunctiva - Right-No scleral icterus, No Discharge. Pupil - Left-Direct reaction to light normal. Pupil - Right-Direct reaction to light normal.  ENMT Ears Pinna - Left - no drainage observed, no generalized tenderness observed. Right - no drainage observed, no generalized tenderness observed. Nose and Sinuses External Inspection of the Nose - no destructive lesion observed. Inspection of the nares - Left - quiet respiration. Right - quiet respiration. Mouth and Throat Lips - Upper Lip - no fissures  observed, no pallor noted. Lower Lip - no fissures observed, no pallor noted. Nasopharynx - no discharge present. Oral Cavity/Oropharynx - Tongue - no dryness observed. Oral Mucosa - no cyanosis observed. Hypopharynx - no evidence of airway distress observed.  Chest and Lung Exam Inspection Movements - Normal and Symmetrical. Accessory muscles - No use of accessory muscles in breathing. Palpation Palpation of the chest reveals - Non-tender. Auscultation Breath sounds - Normal and Clear.  Cardiovascular Auscultation Rhythm - Regular. Murmurs & Other Heart Sounds - Auscultation of the heart reveals - No Murmurs and No Systolic Clicks.  Abdomen Inspection Inspection of the abdomen reveals - No Visible peristalsis and No Abnormal pulsations. Umbilicus - No Bleeding, No Urine drainage. Palpation/Percussion Palpation and Percussion of the abdomen reveal - Soft, Non Tender, No Rebound tenderness, No Rigidity (guarding) and No Cutaneous hyperesthesia. Note: Abdomen soft and flat. Obvious right upper quadrant discomfort but not quite to the point of Murphy sign. Mild epigastric discomfort. The rest the abdomen is nontender. Well-healed laparoscopic incisions. No umbilical hernia. Not distended. No distasis recti. No umbilical or other anterior abdominal wall hernias  Female Genitourinary Sexual Maturity Tanner 5 - Adult hair pattern. Note: No vaginal bleeding nor  discharge  Peripheral Vascular Upper Extremity Inspection - Left - No Cyanotic nailbeds, Not Ischemic. Right - No Cyanotic nailbeds, Not Ischemic.  Neurologic Neurologic evaluation reveals -normal attention span and ability to concentrate, able to name objects and repeat phrases. Appropriate fund of knowledge , normal sensation and normal coordination. Mental Status Affect - not angry, not paranoid. Cranial Nerves-Normal Bilaterally. Gait-Normal.  Neuropsychiatric Mental status exam performed with findings of-able to articulate well with normal speech/language, rate, volume and coherence, thought content normal with ability to perform basic computations and apply abstract reasoning and no evidence of hallucinations, delusions, obsessions or homicidal/suicidal ideation. Note: Quiet. Ponderous. Answers questions cautiously and slowly.  Musculoskeletal Global Assessment Spine, Ribs and Pelvis - no instability, subluxation or laxity. Right Upper Extremity - no instability, subluxation or laxity.  Lymphatic Head & Neck  General Head & Neck Lymphatics: Bilateral - Description - No Localized lymphadenopathy. Axillary  General Axillary Region: Bilateral - Description - No Localized lymphadenopathy. Femoral & Inguinal  Generalized Femoral & Inguinal Lymphatics: Left - Description - No Localized lymphadenopathy. Right - Description - No Localized lymphadenopathy.    Assessment & Plan Jordan Hector MD; 06/08/2017 4:08 PM)  CHRONIC CHOLECYSTITIS WITH CALCULUS (K80.10) Impression: Postprandial symptoms of right upper quadrant pain and chronic nausea very suspicious for chronic cholecystitis and biliary colic. Weight loss surgery to half years ago with significant weight loss also a risk factor.  Given the absence of other possible causes, I suspect her gallbladder is giving her problems and she would benefit from cholecystectomy. She's been trying to stay on a bland diet to avoid  future attacks. Her husband had a severe attack with some fried food and ice cream on a [place] holiday visits that she is terrified of doing anything beyond crackers. I noted it would be a good idea to maybe have some shakes to make sure she keeps her nutrition and proteinuria, but we will try and do her surgery in a decent time.  Current Plans You are being scheduled for surgery- Our schedulers will call you.  You should hear from our office's scheduling department within 5 working days about the location, date, and time of surgery. We try to make accommodations for patient's preferences in scheduling surgery, but sometimes the OR schedule or  the surgeon's schedule prevents [place] from making those accommodations.  If you have not heard from our office [phone]) in 5 working days, call the office and ask for your surgeon's nurse.  If you have other questions about your diagnosis, plan, or surgery, call the office and ask for your surgeon's nurse.  Written instructions provided Pt Education - Pamphlet Given - Laparoscopic Gallbladder Surgery: discussed with patient and provided information. The anatomy & physiology of hepatobiliary & pancreatic function was discussed. The pathophysiology of gallbladder dysfunction was discussed. Natural history risks without surgery was discussed. I feel the risks of no intervention will lead to serious problems that outweigh the operative risks; therefore, I recommended cholecystectomy to remove the pathology. I explained laparoscopic techniques with possible need for an open approach. Probable cholangiogram to evaluate the bilary tract was explained as well.  Risks such as bleeding, infection, abscess, leak, injury to other organs, need for further treatment, heart attack, death, and other risks were discussed. I noted a good likelihood this will help address the problem. Possibility that this will not correct all abdominal symptoms was explained. Goals  of post-operative recovery were discussed as well. We will work to minimize complications. An educational handout further explaining the pathology and treatment options was given as well. Questions were answered. The patient expresses understanding & wishes to proceed with surgery.  Pt Education - [facility] Laparosopic Post Op HCI (Chrislyn Seedorf) Pt Education - [facility] Good Bowel Health (Ariatna Jester) Pt Education - Laparoscopic Cholecystectomy: gallbladder  Jordan Mayer, M.D., F.A.C.S. Gastrointestinal and Minimally Invasive Surgery [facility] [address] [place], [place] [id number] [phone] Main / Paging

## 2017-06-08 NOTE — H&P (View-Only) (Signed)
Hendricks Limes Documented: 06/08/2017 3:30 PM Location: [facility] Patient #: [id number] DOB: 03/14/1971 Married / Language: Cleophus Molt / Race: White Female  History of Present Illness Adin Hector MD; 06/08/2017 6:17 PM) The patient is a 47 year old female who presents for evaluation of gall stones. Note for "Gall stones": ` ` ` Patient sent for surgical consultation at the request of Dr. Shanon Ace  Chief Complaint: Abdominal pain and gallstones. Probable biliary colic.  The patient is a 47 year old female with some fever and leukocytosis and abdominal pain. Diagnosised urinary tract infection. Placed on antibiotics. Was on ciprofloxacin for 10 days. She pretty much stayed in bed. She thought someone told her to do that. No records of her being told to be supine. However her symptoms seem to improve. Also had CAT scan concerning for a left kidney stone that was nonobstructing. She's had a history of kidney stones in the past. However usually triggered by food. The 1 kidney stone remaining seem to be nonobstructing and not a cause of any symptoms. Also on the left and not the right side. 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 However she's had intermittent abdominal pains. Possible gallbladder etiology. Follow-up with her primary care physician.  She had a workup by do bariatric to consider gastric sleeve surgery. However she was felt not to be a candidate recommended medical management and nonoperative weight loss by [facility]. She ended up getting gastric sleeve done by Dr. Duke Salvia @ [facility] in [place], [place]. May 2016. She denies really heartburn or reflux. She is several years out from that. This does not feel like dysphasia. Normally can eat most foods in small volumes. However only can do a bland diet. Heavier foods bother her. She has some mild constipation for  which she occasionally uses Senokot. That's not helped as much. Usually moves her bowels every day before all this. She feels rather tired. Feels constant nausea. His like if she could only throughout she feel better. Pain in the right upper side. Hates to her back and shoulder especially.  No personal nor family history of GI/colon cancer, inflammatory bowel disease, irritable bowel syndrome, allergy such as Celiac Sprue, dietary/dairy problems, colitis, ulcers nor gastritis. No recent sick contacts/gastroenteritis. No travel outside the country. No changes in diet. No dysphagia to solids or liquids. No significant heartburn or reflux. No hematochezia, hematemesis, coffee ground emesis. No evidence of prior gastric/peptic ulceration.  (Review of systems as stated in this history (HPI) or in the review of systems. Otherwise all other 12 point ROS are negative)   Past Surgical History (Tanisha A. Owens Shark, [place]; 06/08/2017 3:30 PM) Foot Surgery Right. Oral Surgery Sleeve Gastrectomy Tonsillectomy  Diagnostic Studies History (Tanisha A. Owens Shark, [place]; 06/08/2017 3:30 PM) Colonoscopy never Mammogram within last year Pap Smear 1-5 years ago  Allergies (Tanisha A. Owens Shark, [place]; 06/08/2017 3:33 PM) Contrast Media Ready-Box *MEDICAL DEVICES AND SUPPLIES* Gadolinium Derivatives Penicillins Allergies Reconciled  Medication History (Tanisha A. Owens Shark, RMA; 06/08/2017 3:36 PM) TraZODone HCl (100MG  Tablet, Oral) Active. Ondansetron (8MG  Tablet Disint, Oral) Active. Escitalopram Oxalate (20MG  Tablet, Oral) Active. Junel FE 1/20 (1-20MG -MCG Tablet, Oral) Active. Spironolactone (100MG  Tablet, Oral) Active. Montelukast Sodium (10MG  Tablet, Oral) Active. Ibuprofen (600MG  Tablet, Oral) Active. Vitamin B12 (1000MCG Tablet ER, Oral) Active. Cyclobenzaprine HCl (10MG  Tablet, Oral) Active. EPINEPHrine (0.3MG /0.3ML Soln Auto-inj, Injection) Active. Evekeo (5MG  Tablet, Oral)  Active. Flonase (50MCG/ACT Suspension, Nasal) Active. Iron (325 (65 Fe)MG Tablet,  Oral) Active. Medications Reconciled  Social History (Tanisha A. Owens Shark, [place]; 06/08/2017 3:30 PM) Alcohol use Occasional alcohol use. Caffeine use Coffee. No drug use Tobacco use Never smoker.  Family History (Tanisha A. Owens Shark, [place]; 06/08/2017 3:30 PM) Migraine Headache Sister. Seizure disorder Family Members In General.  Pregnancy / Birth History (Tanisha A. Owens Shark, [place]; 06/08/2017 3:30 PM) Age at menarche 18 years. Contraceptive History Oral contraceptives. Gravida 2 Irregular periods Length (months) of breastfeeding >24 Maternal age 33-35 Para 2  Other Problems (Tanisha A. Owens Shark, [place]; 06/08/2017 3:30 PM) Anxiety Disorder Cholelithiasis Gastroesophageal Reflux Disease Kidney Stone Migraine Headache     Review of Systems (Tanisha A. Brown RMA; 06/08/2017 3:30 PM) General Present- Appetite Loss and Fatigue. Not Present- Chills, Fever, Night Sweats, Weight Gain and Weight Loss. Skin Present- Jaundice. Not Present- Change in Wart/Mole, Dryness, Hives, New Lesions, Non-Healing Wounds, Rash and Ulcer. HEENT Present- Seasonal Allergies. Not Present- Earache, Hearing Loss, Hoarseness, Nose Bleed, Oral Ulcers, Ringing in the Ears, Sinus Pain, Sore Throat, Visual Disturbances, Wears glasses/contact lenses and Yellow Eyes. Respiratory Present- Difficulty Breathing. Not Present- Bloody sputum, Chronic Cough, Snoring and Wheezing. Breast Not Present- Breast Mass, Breast Pain, Nipple Discharge and Skin Changes. Cardiovascular Not Present- Chest Pain, Difficulty Breathing Lying Down, Leg Cramps, Palpitations, Rapid Heart Rate, Shortness of Breath and Swelling of Extremities. Gastrointestinal Present- Abdominal Pain, Bloating, Change in Bowel Habits, Constipation, Nausea and Vomiting. Not Present- Bloody Stool, Chronic diarrhea, Difficulty Swallowing, Excessive gas, Gets full quickly at meals,  Hemorrhoids, Indigestion and Rectal Pain. Female Genitourinary Present- Frequency. Not Present- Nocturia, Painful Urination, Pelvic Pain and Urgency. Musculoskeletal Present- Back Pain, Muscle Weakness and Swelling of Extremities. Not Present- Joint Pain, Joint Stiffness and Muscle Pain. Neurological Present- Headaches and Weakness. Not Present- Decreased Memory, Fainting, Numbness, Seizures, Tingling, Tremor and Trouble walking. Psychiatric Present- Anxiety. Not Present- Bipolar, Change in Sleep Pattern, Depression, Fearful and Frequent crying.  Vitals (Tanisha A. Brown RMA; 06/08/2017 3:31 PM) 06/08/2017 3:31 PM Weight: 146 lb Height: 65in Body Surface Area: 1.73 m Body Mass Index: 24.3 kg/m  Temp.: 98.63F  Pulse: 78 (Regular)  BP: 112/68 (Sitting, Left Arm, Standard)      Physical Exam Adin Hector MD; 06/08/2017 4:09 PM)  General Mental Status-Alert. General Appearance-Not in acute distress, Not Sickly. Orientation-Oriented X3. Hydration-Well hydrated. Voice-Normal.  Integumentary Global Assessment Upon inspection and palpation of skin surfaces of the - Axillae: non-tender, no inflammation or ulceration, no drainage. and Distribution of scalp and body hair is normal. General Characteristics Temperature - normal warmth is noted.  Head and Neck Head-normocephalic, atraumatic with no lesions or palpable masses. Face Global Assessment - atraumatic, no absence of expression. Neck Global Assessment - no abnormal movements, no bruit auscultated on the right, no bruit auscultated on the left, no decreased range of motion, non-tender. Trachea-midline. Thyroid Gland Characteristics - non-tender.  Eye Eyeball - Left-Extraocular movements intact, No Nystagmus. Eyeball - Right-Extraocular movements intact, No Nystagmus. Cornea - Left-No Hazy. Cornea - Right-No Hazy. Sclera/Conjunctiva - Left-No scleral icterus, No  Discharge. Sclera/Conjunctiva - Right-No scleral icterus, No Discharge. Pupil - Left-Direct reaction to light normal. Pupil - Right-Direct reaction to light normal.  ENMT Ears Pinna - Left - no drainage observed, no generalized tenderness observed. Right - no drainage observed, no generalized tenderness observed. Nose and Sinuses External Inspection of the Nose - no destructive lesion observed. Inspection of the nares - Left - quiet respiration. Right - quiet respiration. Mouth and Throat Lips - Upper Lip - no fissures  observed, no pallor noted. Lower Lip - no fissures observed, no pallor noted. Nasopharynx - no discharge present. Oral Cavity/Oropharynx - Tongue - no dryness observed. Oral Mucosa - no cyanosis observed. Hypopharynx - no evidence of airway distress observed.  Chest and Lung Exam Inspection Movements - Normal and Symmetrical. Accessory muscles - No use of accessory muscles in breathing. Palpation Palpation of the chest reveals - Non-tender. Auscultation Breath sounds - Normal and Clear.  Cardiovascular Auscultation Rhythm - Regular. Murmurs & Other Heart Sounds - Auscultation of the heart reveals - No Murmurs and No Systolic Clicks.  Abdomen Inspection Inspection of the abdomen reveals - No Visible peristalsis and No Abnormal pulsations. Umbilicus - No Bleeding, No Urine drainage. Palpation/Percussion Palpation and Percussion of the abdomen reveal - Soft, Non Tender, No Rebound tenderness, No Rigidity (guarding) and No Cutaneous hyperesthesia. Note: Abdomen soft and flat. Obvious right upper quadrant discomfort but not quite to the point of Murphy sign. Mild epigastric discomfort. The rest the abdomen is nontender. Well-healed laparoscopic incisions. No umbilical hernia. Not distended. No distasis recti. No umbilical or other anterior abdominal wall hernias  Female Genitourinary Sexual Maturity Tanner 5 - Adult hair pattern. Note: No vaginal bleeding nor  discharge  Peripheral Vascular Upper Extremity Inspection - Left - No Cyanotic nailbeds, Not Ischemic. Right - No Cyanotic nailbeds, Not Ischemic.  Neurologic Neurologic evaluation reveals -normal attention span and ability to concentrate, able to name objects and repeat phrases. Appropriate fund of knowledge , normal sensation and normal coordination. Mental Status Affect - not angry, not paranoid. Cranial Nerves-Normal Bilaterally. Gait-Normal.  Neuropsychiatric Mental status exam performed with findings of-able to articulate well with normal speech/language, rate, volume and coherence, thought content normal with ability to perform basic computations and apply abstract reasoning and no evidence of hallucinations, delusions, obsessions or homicidal/suicidal ideation. Note: Quiet. Ponderous. Answers questions cautiously and slowly.  Musculoskeletal Global Assessment Spine, Ribs and Pelvis - no instability, subluxation or laxity. Right Upper Extremity - no instability, subluxation or laxity.  Lymphatic Head & Neck  General Head & Neck Lymphatics: Bilateral - Description - No Localized lymphadenopathy. Axillary  General Axillary Region: Bilateral - Description - No Localized lymphadenopathy. Femoral & Inguinal  Generalized Femoral & Inguinal Lymphatics: Left - Description - No Localized lymphadenopathy. Right - Description - No Localized lymphadenopathy.    Assessment & Plan Adin Hector MD; 06/08/2017 4:08 PM)  CHRONIC CHOLECYSTITIS WITH CALCULUS (K80.10) Impression: Postprandial symptoms of right upper quadrant pain and chronic nausea very suspicious for chronic cholecystitis and biliary colic. Weight loss surgery to half years ago with significant weight loss also a risk factor.  Given the absence of other possible causes, I suspect her gallbladder is giving her problems and she would benefit from cholecystectomy. She's been trying to stay on a bland diet to avoid  future attacks. Her husband had a severe attack with some fried food and ice cream on a [place] holiday visits that she is terrified of doing anything beyond crackers. I noted it would be a good idea to maybe have some shakes to make sure she keeps her nutrition and proteinuria, but we will try and do her surgery in a decent time.  Current Plans You are being scheduled for surgery- Our schedulers will call you.  You should hear from our office's scheduling department within 5 working days about the location, date, and time of surgery. We try to make accommodations for patient's preferences in scheduling surgery, but sometimes the OR schedule or  the surgeon's schedule prevents [place] from making those accommodations.  If you have not heard from our office [phone]) in 5 working days, call the office and ask for your surgeon's nurse.  If you have other questions about your diagnosis, plan, or surgery, call the office and ask for your surgeon's nurse.  Written instructions provided Pt Education - Pamphlet Given - Laparoscopic Gallbladder Surgery: discussed with patient and provided information. The anatomy & physiology of hepatobiliary & pancreatic function was discussed. The pathophysiology of gallbladder dysfunction was discussed. Natural history risks without surgery was discussed. I feel the risks of no intervention will lead to serious problems that outweigh the operative risks; therefore, I recommended cholecystectomy to remove the pathology. I explained laparoscopic techniques with possible need for an open approach. Probable cholangiogram to evaluate the bilary tract was explained as well.  Risks such as bleeding, infection, abscess, leak, injury to other organs, need for further treatment, heart attack, death, and other risks were discussed. I noted a good likelihood this will help address the problem. Possibility that this will not correct all abdominal symptoms was explained. Goals  of post-operative recovery were discussed as well. We will work to minimize complications. An educational handout further explaining the pathology and treatment options was given as well. Questions were answered. The patient expresses understanding & wishes to proceed with surgery.  Pt Education - [facility] Laparosopic Post Op HCI (Zoelle Markus) Pt Education - [facility] Good Bowel Health (Dejha King) Pt Education - Laparoscopic Cholecystectomy: gallbladder  Adin Hector, M.D., F.A.C.S. Gastrointestinal and Minimally Invasive Surgery [facility] [address] [place], [place] [address] [phone] Main / Paging

## 2017-06-10 ENCOUNTER — Telehealth: Payer: Self-pay | Admitting: Internal Medicine

## 2017-06-10 MED ORDER — ONDANSETRON 8 MG PO TBDP: 4.0000 mg | ORAL_TABLET | Freq: Three times a day (TID) | ORAL | 0 refills | Status: DC

## 2017-06-10 NOTE — Telephone Encounter (Addendum)
Medication filled to pharmacy as requested. Patient notified that rx was sent and also wanted to notify office that her surgery has been moved up to 06/14/17.

## 2017-06-10 NOTE — Telephone Encounter (Signed)
Please advise request for Zofran in PCP absence.

## 2017-06-10 NOTE — Telephone Encounter (Signed)
Last OV 06/01/17. Last refill 05/21/16. [facility]  [employer].

## 2017-06-10 NOTE — Telephone Encounter (Signed)
Ok for refill 20 tabs + 0 refills

## 2017-06-10 NOTE — Telephone Encounter (Signed)
Copied from [place]. Topic: Quick Communication - See Telephone Encounter >> Jun 10, 2017  9:58 AM Boyd Kerbs wrote: CRM for notification. See Telephone encounter for:   Patient is needing refill on ondansetron (ZOFRAN-ODT) 8 MG disintegrating tablet,   for nausea .  Will have surgery 2/13 for gallbladder.  [id number] [place], [place] - [place] [address]  [place] [address] Phone: [phone] Fax: [phone]    06/10/17.

## 2017-06-10 NOTE — Addendum Note (Signed)
Addended by: Dorrene German on: 06/10/2017 01:14 PM   Modules accepted: Orders

## 2017-06-13 ENCOUNTER — Encounter (HOSPITAL_COMMUNITY): Payer: Self-pay | Admitting: *Deleted

## 2017-06-13 ENCOUNTER — Other Ambulatory Visit: Payer: Self-pay

## 2017-06-13 NOTE — Progress Notes (Signed)
NO SOLID FOOD AFTER MIDNIGHT THE NIGHT PRIOR TO SRRGERY. NOTHING BY MOUTH EXCEPT CLEAR LIQUIDS UNTIL 3 HOURS PRIOR TO [place] SURGERY. PLEASE FINISH ENSURE DRINK PER SURGEON ORDER 3 HOURS PRIOR TO SCHEDULED SURGERY TIME WHICH NEEDS TO BE COMPLETED AT  1045 am.

## 2017-06-14 ENCOUNTER — Encounter (HOSPITAL_COMMUNITY): Payer: Self-pay | Admitting: *Deleted

## 2017-06-14 ENCOUNTER — Ambulatory Visit (HOSPITAL_COMMUNITY): Payer: BLUE CROSS/BLUE SHIELD | Admitting: Anesthesiology

## 2017-06-14 ENCOUNTER — Ambulatory Visit (HOSPITAL_COMMUNITY)
Admission: RE | Admit: 2017-06-14 | Discharge: 2017-06-14 | Disposition: A | Discharge status: Home / Self Care | Payer: BLUE CROSS/BLUE SHIELD | Source: Ambulatory Visit | Attending: Surgery | Admitting: Surgery

## 2017-06-14 ENCOUNTER — Encounter (HOSPITAL_COMMUNITY)
Admission: RE | Disposition: A | Discharge status: Home / Self Care | Payer: Self-pay | Source: Ambulatory Visit | Attending: Surgery

## 2017-06-14 DIAGNOSIS — K5900 Constipation, unspecified: Secondary | ICD-10-CM | POA: Insufficient documentation

## 2017-06-14 DIAGNOSIS — Z9884 Bariatric surgery status: Secondary | ICD-10-CM | POA: Diagnosis not present

## 2017-06-14 DIAGNOSIS — Z820 Family history of epilepsy and other diseases of the nervous system: Secondary | ICD-10-CM | POA: Insufficient documentation

## 2017-06-14 DIAGNOSIS — K8010 Calculus of gallbladder with chronic cholecystitis without obstruction: Secondary | ICD-10-CM | POA: Insufficient documentation

## 2017-06-14 DIAGNOSIS — N926 Irregular menstruation, unspecified: Secondary | ICD-10-CM | POA: Insufficient documentation

## 2017-06-14 DIAGNOSIS — K219 Gastro-esophageal reflux disease without esophagitis: Secondary | ICD-10-CM | POA: Diagnosis not present

## 2017-06-14 DIAGNOSIS — Z888 Allergy status to other drugs, medicaments and biological substances status: Secondary | ICD-10-CM | POA: Diagnosis not present

## 2017-06-14 DIAGNOSIS — F419 Anxiety disorder, unspecified: Secondary | ICD-10-CM | POA: Insufficient documentation

## 2017-06-14 DIAGNOSIS — F909 Attention-deficit hyperactivity disorder, unspecified type: Secondary | ICD-10-CM | POA: Diagnosis not present

## 2017-06-14 DIAGNOSIS — G43909 Migraine, unspecified, not intractable, without status migrainosus: Secondary | ICD-10-CM | POA: Insufficient documentation

## 2017-06-14 DIAGNOSIS — J302 Other seasonal allergic rhinitis: Secondary | ICD-10-CM | POA: Diagnosis not present

## 2017-06-14 DIAGNOSIS — Z91041 Radiographic dye allergy status: Secondary | ICD-10-CM | POA: Insufficient documentation

## 2017-06-14 DIAGNOSIS — Z79899 Other long term (current) drug therapy: Secondary | ICD-10-CM | POA: Diagnosis not present

## 2017-06-14 DIAGNOSIS — I880 Nonspecific mesenteric lymphadenitis: Secondary | ICD-10-CM | POA: Insufficient documentation

## 2017-06-14 DIAGNOSIS — Z87442 Personal history of urinary calculi: Secondary | ICD-10-CM | POA: Diagnosis not present

## 2017-06-14 DIAGNOSIS — Z880 Allergy status to penicillin: Secondary | ICD-10-CM | POA: Insufficient documentation

## 2017-06-14 DIAGNOSIS — N390 Urinary tract infection, site not specified: Secondary | ICD-10-CM | POA: Insufficient documentation

## 2017-06-14 DIAGNOSIS — N200 Calculus of kidney: Secondary | ICD-10-CM | POA: Insufficient documentation

## 2017-06-14 HISTORY — PX: LAPAROSCOPIC CHOLECYSTECTOMY SINGLE SITE WITH INTRAOPERATIVE CHOLANGIOGRAM: SHX6538

## 2017-06-14 LAB — HCG, SERUM, QUALITATIVE: Preg, Serum: NEGATIVE

## 2017-06-14 SURGERY — LAPAROSCOPIC CHOLECYSTECTOMY SINGLE SITE WITH INTRAOPERATIVE CHOLANGIOGRAM
Anesthesia: General

## 2017-06-14 MED ORDER — OXYCODONE HCL 5 MG PO TABS
5.0000 mg | ORAL_TABLET | ORAL | Status: DC | PRN
  Administered 2017-06-14: 5 mg via ORAL

## 2017-06-14 MED ORDER — HYDROCODONE-ACETAMINOPHEN 7.5-325 MG PO TABS: 1.0000 | ORAL_TABLET | Freq: Once | ORAL | Status: DC | PRN

## 2017-06-14 MED ORDER — SODIUM CHLORIDE 0.9% FLUSH: 3.0000 mL | INTRAVENOUS | Status: DC | PRN

## 2017-06-14 MED ORDER — GABAPENTIN 300 MG PO CAPS
ORAL_CAPSULE | ORAL | Status: AC
  Administered 2017-06-14: 300 mg via ORAL
  Filled 2017-06-14: qty 1

## 2017-06-14 MED ORDER — OXYCODONE HCL 5 MG PO TABS
ORAL_TABLET | ORAL | Status: AC
  Filled 2017-06-14: qty 1

## 2017-06-14 MED ORDER — LACTATED RINGERS IV SOLN
INTRAVENOUS | Status: DC
  Administered 2017-06-14 (×2): via INTRAVENOUS

## 2017-06-14 MED ORDER — ACETAMINOPHEN 650 MG RE SUPP
650.0000 mg | RECTAL | Status: DC | PRN
  Filled 2017-06-14: qty 1

## 2017-06-14 MED ORDER — PROPOFOL 10 MG/ML IV BOLUS
INTRAVENOUS | Status: AC
  Filled 2017-06-14: qty 20

## 2017-06-14 MED ORDER — TRAMADOL HCL 50 MG PO TABS: 50.0000 mg | ORAL_TABLET | Freq: Four times a day (QID) | ORAL | 0 refills | Status: DC | PRN

## 2017-06-14 MED ORDER — STERILE WATER FOR IRRIGATION IR SOLN
Status: DC | PRN
  Administered 2017-06-14: 1000 mL

## 2017-06-14 MED ORDER — ACETAMINOPHEN 500 MG PO TABS
ORAL_TABLET | ORAL | Status: AC
  Administered 2017-06-14: 1000 mg via ORAL
  Filled 2017-06-14: qty 1

## 2017-06-14 MED ORDER — SUGAMMADEX SODIUM 200 MG/2ML IV SOLN
INTRAVENOUS | Status: AC
  Filled 2017-06-14: qty 2

## 2017-06-14 MED ORDER — HYDROMORPHONE HCL 1 MG/ML IJ SOLN
INTRAMUSCULAR | Status: DC | PRN
  Administered 2017-06-14 (×2): 1 mg via INTRAVENOUS

## 2017-06-14 MED ORDER — SUGAMMADEX SODIUM 200 MG/2ML IV SOLN
INTRAVENOUS | Status: DC | PRN
  Administered 2017-06-14: 200 mg via INTRAVENOUS

## 2017-06-14 MED ORDER — PROPOFOL 10 MG/ML IV BOLUS
INTRAVENOUS | Status: DC | PRN
  Administered 2017-06-14: 140 mg via INTRAVENOUS

## 2017-06-14 MED ORDER — HYDROMORPHONE HCL 1 MG/ML IJ SOLN: 0.2500 mg | INTRAMUSCULAR | Status: DC | PRN

## 2017-06-14 MED ORDER — ONDANSETRON HCL 4 MG/2ML IJ SOLN
INTRAMUSCULAR | Status: DC | PRN
  Administered 2017-06-14: 4 mg via INTRAVENOUS

## 2017-06-14 MED ORDER — DEXAMETHASONE SODIUM PHOSPHATE 10 MG/ML IJ SOLN
INTRAMUSCULAR | Status: AC
  Filled 2017-06-14: qty 1

## 2017-06-14 MED ORDER — ROCURONIUM BROMIDE 50 MG/5ML IV SOSY
PREFILLED_SYRINGE | INTRAVENOUS | Status: AC
  Filled 2017-06-14: qty 5

## 2017-06-14 MED ORDER — MEPERIDINE HCL 50 MG/ML IJ SOLN: 6.2500 mg | INTRAMUSCULAR | Status: DC | PRN

## 2017-06-14 MED ORDER — ONDANSETRON HCL 4 MG/2ML IJ SOLN
INTRAMUSCULAR | Status: AC
  Filled 2017-06-14: qty 2

## 2017-06-14 MED ORDER — CHLORHEXIDINE GLUCONATE CLOTH 2 % EX PADS: 6.0000 | MEDICATED_PAD | Freq: Once | CUTANEOUS | Status: DC

## 2017-06-14 MED ORDER — EPHEDRINE SULFATE-NACL 50-0.9 MG/10ML-% IV SOSY
PREFILLED_SYRINGE | INTRAVENOUS | Status: DC | PRN
  Administered 2017-06-14: 5 mg via INTRAVENOUS

## 2017-06-14 MED ORDER — LIDOCAINE 2% (20 MG/ML) 5 ML SYRINGE
INTRAMUSCULAR | Status: AC
  Filled 2017-06-14: qty 5

## 2017-06-14 MED ORDER — 0.9 % SODIUM CHLORIDE (POUR BTL) OPTIME
TOPICAL | Status: DC | PRN
  Administered 2017-06-14: 1000 mL

## 2017-06-14 MED ORDER — PROMETHAZINE HCL 25 MG/ML IJ SOLN: 6.2500 mg | INTRAMUSCULAR | Status: DC | PRN

## 2017-06-14 MED ORDER — HYDROMORPHONE HCL 2 MG/ML IJ SOLN
INTRAMUSCULAR | Status: AC
  Filled 2017-06-14: qty 1

## 2017-06-14 MED ORDER — CEFAZOLIN SODIUM-DEXTROSE 2-4 GM/100ML-% IV SOLN
INTRAVENOUS | Status: AC
  Filled 2017-06-14: qty 100

## 2017-06-14 MED ORDER — LIDOCAINE 2% (20 MG/ML) 5 ML SYRINGE
INTRAMUSCULAR | Status: DC | PRN
  Administered 2017-06-14: 100 mg via INTRAVENOUS

## 2017-06-14 MED ORDER — ROCURONIUM BROMIDE 10 MG/ML (PF) SYRINGE
PREFILLED_SYRINGE | INTRAVENOUS | Status: DC | PRN
  Administered 2017-06-14: 20 mg via INTRAVENOUS
  Administered 2017-06-14: 30 mg via INTRAVENOUS

## 2017-06-14 MED ORDER — DEXAMETHASONE SODIUM PHOSPHATE 4 MG/ML IJ SOLN
INTRAMUSCULAR | Status: DC | PRN
  Administered 2017-06-14: 10 mg via INTRAVENOUS

## 2017-06-14 MED ORDER — FENTANYL CITRATE (PF) 100 MCG/2ML IJ SOLN: 25.0000 ug | INTRAMUSCULAR | Status: DC | PRN

## 2017-06-14 MED ORDER — BUPIVACAINE-EPINEPHRINE 0.5% -1:200000 IJ SOLN
INTRAMUSCULAR | Status: AC
  Filled 2017-06-14: qty 1

## 2017-06-14 MED ORDER — GABAPENTIN 300 MG PO CAPS
300.0000 mg | ORAL_CAPSULE | ORAL | Status: AC
  Administered 2017-06-14: 300 mg via ORAL

## 2017-06-14 MED ORDER — MIDAZOLAM HCL 5 MG/5ML IJ SOLN
INTRAMUSCULAR | Status: DC | PRN
  Administered 2017-06-14: 2 mg via INTRAVENOUS

## 2017-06-14 MED ORDER — ACETAMINOPHEN 10 MG/ML IV SOLN: 1000.0000 mg | Freq: Once | INTRAVENOUS | Status: DC | PRN

## 2017-06-14 MED ORDER — MEPERIDINE HCL 50 MG/ML IJ SOLN
INTRAMUSCULAR | Status: AC
  Filled 2017-06-14: qty 1

## 2017-06-14 MED ORDER — BUPIVACAINE-EPINEPHRINE 0.5% -1:200000 IJ SOLN
INTRAMUSCULAR | Status: DC | PRN
  Administered 2017-06-14: 30 mL

## 2017-06-14 MED ORDER — SODIUM CHLORIDE 0.9% FLUSH: 3.0000 mL | Freq: Two times a day (BID) | INTRAVENOUS | Status: DC

## 2017-06-14 MED ORDER — SODIUM CHLORIDE 0.9 % IV SOLN: 250.0000 mL | INTRAVENOUS | Status: DC | PRN

## 2017-06-14 MED ORDER — IOPAMIDOL (ISOVUE-300) INJECTION 61%
INTRAVENOUS | Status: AC
  Filled 2017-06-14: qty 50

## 2017-06-14 MED ORDER — LACTATED RINGERS IR SOLN
Status: DC | PRN
  Administered 2017-06-14 (×2): 1000 mL

## 2017-06-14 MED ORDER — MEPERIDINE HCL 50 MG/ML IJ SOLN
6.2500 mg | INTRAMUSCULAR | Status: DC | PRN
  Administered 2017-06-14: 6.25 mg via INTRAVENOUS

## 2017-06-14 MED ORDER — MIDAZOLAM HCL 2 MG/2ML IJ SOLN
INTRAMUSCULAR | Status: AC
Start: 2017-06-14 — End: ?
  Filled 2017-06-14: qty 2

## 2017-06-14 MED ORDER — ACETAMINOPHEN 500 MG PO TABS
1000.0000 mg | ORAL_TABLET | ORAL | Status: AC
  Administered 2017-06-14: 1000 mg via ORAL

## 2017-06-14 MED ORDER — EPHEDRINE 5 MG/ML INJ
INTRAVENOUS | Status: AC
  Filled 2017-06-14: qty 10

## 2017-06-14 MED ORDER — ACETAMINOPHEN 325 MG PO TABS: 650.0000 mg | ORAL_TABLET | ORAL | Status: DC | PRN

## 2017-06-14 SURGICAL SUPPLY — 41 items
APPLIER CLIP 5 13 M/L LIGAMAX5 (MISCELLANEOUS) ×3
APR CLP MED LRG 5 ANG JAW (MISCELLANEOUS) ×1
BAG SPEC RTRVL LRG 6X4 10 (ENDOMECHANICALS)
CABLE HIGH FREQUENCY MONO STRZ (ELECTRODE) ×3 IMPLANT
CHLORAPREP W/TINT 26ML (MISCELLANEOUS) ×3 IMPLANT
CLIP APPLIE 5 13 M/L LIGAMAX5 (MISCELLANEOUS) ×1 IMPLANT
COVER MAYO STAND STRL (DRAPES) ×3 IMPLANT
COVER SURGICAL LIGHT HANDLE (MISCELLANEOUS) ×3 IMPLANT
DECANTER SPIKE VIAL GLASS SM (MISCELLANEOUS) ×3 IMPLANT
DRAIN CHANNEL 19F RND (DRAIN) IMPLANT
DRAPE C-ARM 42X120 X-RAY (DRAPES) ×1 IMPLANT
DRAPE WARM FLUID 44X44 (DRAPE) ×3 IMPLANT
DRSG TEGADERM 4X4.75 (GAUZE/BANDAGES/DRESSINGS) ×3 IMPLANT
ELECT REM PT RETURN 15FT ADLT (MISCELLANEOUS) ×3 IMPLANT
ENDOLOOP SUT PDS II  0 18 (SUTURE)
ENDOLOOP SUT PDS II 0 18 (SUTURE) IMPLANT
EVACUATOR SILICONE 100CC (DRAIN) IMPLANT
GAUZE SPONGE 2X2 8PLY STRL LF (GAUZE/BANDAGES/DRESSINGS) ×1 IMPLANT
GAUZE SPONGE 4X4 12PLY STRL (GAUZE/BANDAGES/DRESSINGS) ×2 IMPLANT
GLOVE ECLIPSE 8.0 STRL XLNG CF (GLOVE) ×3 IMPLANT
GLOVE INDICATOR 8.0 STRL GRN (GLOVE) ×3 IMPLANT
GOWN STRL REUS W/TWL XL LVL3 (GOWN DISPOSABLE) ×6 IMPLANT
IRRIG SUCT STRYKERFLOW 2 WTIP (MISCELLANEOUS) ×3
IRRIGATION SUCT STRKRFLW 2 WTP (MISCELLANEOUS) ×1 IMPLANT
KIT BASIN OR (CUSTOM PROCEDURE TRAY) ×3 IMPLANT
PAD POSITIONING PINK XL (MISCELLANEOUS) ×3 IMPLANT
POSITIONER SURGICAL ARM (MISCELLANEOUS) IMPLANT
POUCH SPECIMEN RETRIEVAL 10MM (ENDOMECHANICALS) IMPLANT
SCISSORS METZENBAUM CVD 33 (INSTRUMENTS) ×3 IMPLANT
SET CHOLANGIOGRAPH MIX (MISCELLANEOUS) ×1 IMPLANT
SHEARS HARMONIC ACE PLUS 36CM (ENDOMECHANICALS) ×3 IMPLANT
SPONGE GAUZE 2X2 STER 10/PKG (GAUZE/BANDAGES/DRESSINGS) ×2
SUT MNCRL AB 4-0 PS2 18 (SUTURE) ×3 IMPLANT
SUT PDS AB 1 CT1 27 (SUTURE) ×8 IMPLANT
SYR 20CC LL (SYRINGE) ×3 IMPLANT
TOWEL OR 17X26 10 PK STRL BLUE (TOWEL DISPOSABLE) ×3 IMPLANT
TOWEL OR NON WOVEN STRL DISP B (DISPOSABLE) ×3 IMPLANT
TRAY LAPAROSCOPIC (CUSTOM PROCEDURE TRAY) ×3 IMPLANT
TROCAR BLADELESS OPT 5 100 (ENDOMECHANICALS) ×3 IMPLANT
TROCAR BLADELESS OPT 5 150 (ENDOMECHANICALS) ×3 IMPLANT
TUBING INSUF HEATED (TUBING) ×3 IMPLANT

## 2017-06-14 NOTE — Interval H&P Note (Signed)
History and Physical Interval Note:  06/14/2017 1:22 PM  Jordan Mayer  has presented today for surgery, with the diagnosis of SYMPOMATIC BILIARY COLIC, PROBABLE CHRONIC CHOLECYSTITIS  The various methods of treatment have been discussed with the patient and family. After consideration of risks, benefits and other options for treatment, the patient has consented to  Procedure(s): [place] CHOLANGIOGRAM (N/A) as a surgical intervention .  The patient's history has been reviewed, patient examined, no change in status, stable for surgery.  I have reviewed the patient's chart and labs.  Questions were answered to the patient's satisfaction.    I have re-reviewed the the patient's records, history, medications, and allergies.  I have re-examined the patient.  I again discussed intraoperative plans and goals of post-operative recovery.  The patient agrees to proceed.  Jordan Mayer  [birth year] [id number]  Patient Care Team: Burnis Medin, MD as PCP - General Richmond Campbell, MD (Gastroenterology) Tiajuana Amass, MD (Gynecology) Duke Salvia Venia Carbon, MD as Consulting Physician [facility])  Patient Active Problem List   Diagnosis Date Noted  . Varicose veins of leg with complications 78/93/8101  . Varicose veins of lower extremities with other complications 75/02/2584  . Vertigo 12/23/2011  . Otalgia of both ears 09/17/2010  . SLEEP DISORDER/DISTURBANCE 09/27/2008  . FATIGUE 07/15/2008  . ADHD 12/05/2007  . HEADACHE 11/26/2007  . ALLERGIC RHINITIS 10/13/2007  . NEPHROLITHIASIS, HX OF 10/13/2007  . NONSPECIFIC MESENTERIC LYMPHADENITIS 03/17/2007  . RENAL CALCULUS, LEFT 03/17/2007  . DEPRESSION 03/07/2007  . DYSPNEA ON EXERTION 03/07/2007    Past Medical History:  Diagnosis Date  . ADD (attention deficit disorder)   . Allergy   . Asthma   . Chicken pox   . Depression   . PCOS (polycystic ovarian syndrome)   . Renal disorder     kidney disease  . 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 Shingles     Past Surgical History:  Procedure Laterality Date  . BUNIONECTOMY    . LAPAROSCOPIC GASTRIC SLEEVE RESECTION  09/2013  . LASER ABLATION     Vascular  . TONSILLECTOMY    . WISDOM TOOTH EXTRACTION      Social History   Socioeconomic History  . Marital status: Married    Spouse name: Not on file  . Number of children: Not on file  . Years of education: Not on file  . Highest education level: Not on file  Social Needs  . Financial resource strain: Not on file  . Food insecurity - worry: Not on file  . Food insecurity - inability: Not on file  . Transportation needs - medical: Not on file  . Transportation needs - non-medical: Not on file  Occupational History  . Occupation: Nurse, adult  Tobacco Use  . Smoking status: Never Smoker  . Smokeless tobacco: Never Used  Substance and Sexual Activity  . Alcohol use: Yes    Alcohol/week: 0.0 oz    Comment: occasional  . Drug use: No  . Sexual activity: Yes    Birth control/protection: IUD  Other Topics Concern  . Not on file  Social History Narrative   7 hours of sleep per night   Lives with her husband and 2 kids   Does not work/Full Time Volunteer   Has a dog in the home    Family History  Problem Relation Age of Onset  . Varicose Veins Mother   . Asthma Mother   . Varicose Veins Sister   .  Birth defects Maternal Aunt        Cognitively Challenged  . Miscarriages / Stillbirths Maternal Aunt   . Varicose Veins Maternal Aunt   . Cancer Paternal Aunt        Melanoma  . Hearing loss Paternal Aunt   . Varicose Veins Paternal Aunt   . Arthritis Maternal Grandmother   . Arthritis Paternal Grandfather   . Hearing loss Paternal Grandfather     Medications Prior to Admission  Medication Sig Dispense Refill Last Dose  . acetaminophen (TYLENOL) 325 MG tablet Take 325-650 mg by mouth every 6 (six) hours as needed for mild pain or moderate pain.   06/13/2017  at 2100  . Amphetamine Sulfate (EVEKEO) 10 MG TABS Take 10 mg by mouth daily.   Past Month at Unknown time  . escitalopram (LEXAPRO) 20 MG tablet Take 20 mg by mouth at bedtime.    06/13/2017 at 2100  . ferrous fumarate (FERRO-SEQUELS) 18 MG CR tablet Take 18 mg by mouth daily.   06/13/2017 at Unknown time  . fexofenadine (ALLEGRA) 180 MG tablet Take 180 mg by mouth at bedtime.    06/13/2017 at 2100  . JUNEL FE 1/20 1-20 MG-MCG tablet Take 1 tablet by mouth daily.  4 06/13/2017 at 1000  . montelukast (SINGULAIR) 10 MG tablet Take 10 mg by mouth at bedtime.   06/13/2017 at 2100  . Multiple Vitamin (MULTIVITAMIN) tablet Take 1 tablet by mouth daily.     06/13/2017 at 2100  . ondansetron (ZOFRAN-ODT) 4 MG disintegrating tablet Take 4 mg by mouth every 8 (eight) hours as needed for nausea or vomiting.   06/13/2017 at 1800  . spironolactone (ALDACTONE) 100 MG tablet Take 200 mg by mouth daily.    06/13/2017 at 2100  . traZODone (DESYREL) 100 MG tablet Take 200 mg by mouth at bedtime.    06/13/2017 at 2200  . tretinoin (RETIN-A) 0.025 % cream Apply 1 application topically daily as needed.   Past Month at Unknown time  . triamcinolone (NASACORT ALLERGY 24HR) 55 MCG/ACT AERO nasal inhaler Place 1 spray into the nose daily.   Past Month at Unknown time  . clindamycin (CLEOCIN T) 1 % lotion Apply 1 application topically once a week.   More than a month at Unknown time  . cyclobenzaprine (FLEXERIL) 10 MG tablet Take 10 mg by mouth daily as needed for muscle spasms.    More than a month at Unknown time  . EPINEPHrine 0.3 mg/0.3 mL IJ SOAJ injection Inject 0.3 mLs into the skin daily as needed (allergic reactions).    Unknown at Unknown time  . ibuprofen (ADVIL,MOTRIN) 600 MG tablet Take 1 tablet (600 mg total) by mouth every 6 (six) hours as needed. (Patient not taking: Reported on 06/13/2017) 30 tablet 0 Not Taking at Unknown time  . ondansetron (ZOFRAN-ODT) 8 MG disintegrating tablet Take 0.5 tablets (4 mg total) by  mouth every 8 (eight) hours. sublingual (Patient not taking: Reported on 06/13/2017) 20 tablet 0 Not Taking at Unknown time    Current Facility-Administered Medications  Medication Dose Route Frequency Provider Last Rate Last Dose  . Chlorhexidine Gluconate Cloth 2 % PADS 6 each  6 each Topical Once Michael Boston, MD       And  . Chlorhexidine Gluconate Cloth 2 % PADS 6 each  6 each Topical Once Michael Boston, MD      . lactated ringers infusion   Intravenous Continuous Barnet Glasgow, MD 100 mL/hr at 06/14/17  1215       Allergies  Allergen Reactions  . Contrast Media [Iodinated Diagnostic Agents]   . Gadolinium Derivatives Hives and Itching  . Iohexol      Code: HIVES, Desc: 1 hive developed on hand post injection of 125cc's Omni 300., Onset Date: [id number]   . Penicillins Other (See Comments)    Has patient had a PCN reaction causing immediate rash, facial/tongue/throat swelling, SOB or lightheadedness with hypotension: Yes Has patient had a PCN reaction causing severe rash involving mucus membranes or skin necrosis: No Has patient had a PCN reaction that required hospitalization: No Has patient had a PCN reaction occurring within the last 10 years: No If all of the above answers are "NO", then may proceed with Cephalosporin use.    BP 108/66   Pulse 78   Temp 98.5 F (36.9 C) (Oral)   Resp 18   Ht 5\' 5"  (1.651 m)   Wt 67.1 kg (148 lb)   LMP 05/22/2017   SpO2 99%   BMI 24.63 kg/m   Labs: Results for orders placed or performed during the hospital encounter of 06/14/17 (from the past 48 hour(s))  hCG, serum, qualitative     Status: None   Collection Time: 06/14/17 12:00 PM  Result Value Ref Range   Preg, Serum NEGATIVE NEGATIVE    Comment:        THE SENSITIVITY OF THIS METHODOLOGY IS >10 mIU/mL.     Imaging / Studies: Ct Abdomen Pelvis Wo Contrast  Result Date: 05/21/2017 CLINICAL DATA:  Right-sided abdominal pain for 4 days. EXAM: CT ABDOMEN AND PELVIS WITHOUT  CONTRAST TECHNIQUE: Multidetector CT imaging of the abdomen and pelvis was performed following the standard protocol without IV contrast. COMPARISON:  08/15/2011 FINDINGS: Lower chest: Mild dependent changes in the lung bases. Hepatobiliary: No focal liver abnormality is seen. No gallstones, gallbladder wall thickening, or biliary dilatation. Pancreas: Unremarkable. No pancreatic ductal dilatation or surrounding inflammatory changes. Spleen: Normal in size without focal abnormality. Adrenals/Urinary Tract: No adrenal gland nodules. 3 mm nonobstructing stone in the midpole left kidney. No hydronephrosis or hydroureter. No ureteral stones. No bladder stone or bladder wall thickening. Stomach/Bowel: Postoperative changes in the stomach consistent with gastric sleeve. Small bowel and colon are decompressed with scattered stool in the colon. No wall thickening or inflammatory changes are appreciated. Appendix is normal. Vascular/Lymphatic: No aortic aneurysm or significant calcification. Prominent lymph nodes throughout the mesentery and retroperitoneum without pathologic enlargement. Hazy fat stranding throughout the mesentery. Changes are most likely inflammatory, suggesting mesenteric adenitis or sclerosing mesenteritis. Early lymphoma could also have this appearance. Similar changes were present on the previous study, arguing against a malignant process. Reproductive: Uterus and bilateral adnexa are unremarkable. Other: No free air or free fluid in the abdomen. Abdominal wall musculature appears intact. Musculoskeletal: No acute or significant osseous findings. IMPRESSION: 1. 3 mm nonobstructing stone in the midpole left kidney. No ureteral stone or obstruction. 2. Prominent non pathologically enlarged lymph nodes throughout the mesentery and retroperitoneum with hazy fat stranding in the mesentery. Changes are likely inflammatory, suggesting mesenteric adenitis or sclerosing mesenteritis. Early lymphoma would be a  less likely consideration. 3. Postoperative stomach consistent with gastric sleeve. Electronically Signed   By: Lucienne Capers M.D.   On: 05/21/2017 18:22   US Abdomen Limited Ruq  Result Date: 05/21/2017 CLINICAL DATA:  Abdominal pain for 4 days.  Fever for 2 days. EXAM: ULTRASOUND ABDOMEN LIMITED RIGHT UPPER QUADRANT COMPARISON:  CT abdomen and pelvis 05/21/2017  FINDINGS: Gallbladder: Cholelithiasis with several stones in the dependent gallbladder. Largest measures about 1.2 cm diameter. No sludge, edema, or wall thickening. Murphy's sign is negative. Common bile duct: Diameter: 4 mm, normal Liver: No focal lesion identified. Within normal limits in parenchymal echogenicity. Portal vein is patent on color Doppler imaging with normal direction of blood flow towards the liver. IMPRESSION: Cholelithiasis.  No additional changes to suggest cholecystitis. Electronically Signed   By: Lucienne Capers M.D.   On: 05/21/2017 19:37     .Adin Hector, M.D., F.A.C.S. Gastrointestinal and Minimally Invasive Surgery [facility] [address], [place], [place] [address] [phone] Main / Paging  06/14/2017 1:22 PM    Adin Hector

## 2017-06-14 NOTE — Anesthesia Postprocedure Evaluation (Signed)
Anesthesia Post Note  Patient: Jordan Mayer  Procedure(s) Performed: LAPAROSCOPIC CHOLECYSTECTOMY SINGLE SITE (N/A )     Patient location during evaluation: PACU Anesthesia Type: General Level of consciousness: awake and alert Pain management: pain level controlled Vital Signs Assessment: post-procedure vital signs reviewed and stable Respiratory status: spontaneous breathing, nonlabored ventilation, respiratory function stable and patient connected to nasal cannula oxygen Cardiovascular status: blood pressure returned to baseline and stable Postop Assessment: no apparent nausea or vomiting Anesthetic complications: no    Last Vitals:  Vitals:   06/14/17 1643 06/14/17 1745  BP: (!) 140/106 (!) 138/94  Pulse: 77 78  Resp: 14 16  Temp: 36.7 C   SpO2: 96% 96%    Last Pain:  Vitals:   06/14/17 1745  TempSrc:   PainSc: 4                  Tilford Deaton S

## 2017-06-14 NOTE — Transfer of Care (Signed)
Immediate Anesthesia Transfer of Care Note  Patient: Jordan Mayer  Procedure(s) Performed: LAPAROSCOPIC CHOLECYSTECTOMY SINGLE SITE (N/A )  Patient Location: PACU  Anesthesia Type:General  Level of Consciousness: drowsy and patient cooperative  Airway & Oxygen Therapy: Patient Spontanous Breathing and Patient connected to face mask  Post-op Assessment: Report given to RN and Post -op Vital signs reviewed and stable  Post vital signs: Reviewed  Last Vitals:  Vitals:   06/14/17 1127  BP: 108/66  Pulse: 78  Resp: 18  Temp: 36.9 C  SpO2: 99%    Last Pain:  Vitals:   06/14/17 1127  TempSrc: Oral         Complications: No apparent anesthesia complications

## 2017-06-14 NOTE — Anesthesia Procedure Notes (Signed)
Procedure Name: Intubation Date/Time: 06/14/2017 2:09 PM Performed by: Claudia Desanctis, CRNA Pre-anesthesia Checklist: Patient identified, Emergency Drugs available, Suction available and Patient being monitored Patient Re-evaluated:Patient Re-evaluated prior to induction Oxygen Delivery Method: Circle system utilized Preoxygenation: Pre-oxygenation with 100% oxygen Induction Type: IV induction Ventilation: Mask ventilation without difficulty Laryngoscope Size: 2 and Miller Grade View: Grade I Tube type: Oral Tube size: 7.0 mm Number of attempts: 1 Airway Equipment and Method: Stylet Placement Confirmation: ETT inserted through vocal cords under direct vision,  positive ETCO2 and breath sounds checked- equal and bilateral Secured at: 22 cm Tube secured with: Tape Dental Injury: Teeth and Oropharynx as per pre-operative assessment

## 2017-06-14 NOTE — Op Note (Signed)
06/14/2017  PATIENT:  Jordan Mayer  47 y.o. female  Patient Care Team: Panosh, Standley Brooking, MD as PCP - General Richmond Campbell, MD (Gastroenterology) Tiajuana Amass, MD (Gynecology) Duke Salvia Venia Carbon, MD as Consulting Physician Tia Masker) Michael Boston, MD as Consulting Physician (General Surgery)  PRE-OPERATIVE DIAGNOSIS:    Chronic [place] cholecystitis  POST-OPERATIVE DIAGNOSIS:   Chronic [place] cholecystitis  Liver: normal  PROCEDURE:  SINGLE SITE Laparoscopic cholecystectomy  SURGEON:  Adin Hector, MD, FACS.  ASSISTANT: RNFA   ANESTHESIA:    General with endotracheal intubation Local anesthetic as a field block  EBL:  (See Anesthesia Intraoperative Record) Total I/O In: 1000 [I.V.:1000] Out: 25 [Blood:25]  Delay start of Pharmacological VTE agent (>24hrs) due to surgical blood loss or risk of bleeding:  no  DRAINS: None   SPECIMEN: Gallbladder    DISPOSITION OF SPECIMEN:  PATHOLOGY  COUNTS:  YES  PLAN OF CARE: Discharge to home after PACU  PATIENT DISPOSITION:  PACU - hemodynamically stable.  INDICATION: Pleasant woman with weight loss status post gastric sleeve now with biliary colic and stones suspicious for chronic cholecystitis.  I recommended cholecystectomy.  The anatomy & physiology of hepatobiliary & pancreatic function was discussed.  The pathophysiology of gallbladder dysfunction was discussed.  Natural history risks without surgery was discussed.   I feel the risks of no intervention will lead to serious problems that outweigh the operative risks; therefore, I recommended cholecystectomy to remove the pathology.  I explained laparoscopic techniques with possible need for an open approach.  Probable cholangiogram to evaluate the bilary tract was explained as well.    Risks such as bleeding, infection, abscess, leak, injury to other organs, need for further treatment, heart attack, death, and other risks were discussed.  I noted a good  likelihood this will help address the problem.  Possibility that this will not correct all abdominal symptoms was explained.  Goals of post-operative recovery were discussed as well.  We will work to minimize complications.  An educational handout further explaining the pathology and treatment options was given as well.  Questions were answered.  The patient expresses understanding & wishes to proceed with surgery.  OR FINDINGS: Moderate adhesions of greater omentum and mesocolon to the gallbladder consistent with chronic inflammation and chronic cholecystitis.  Liver: normal  DESCRIPTION:   The patient was identified & brought in the operating room. The patient was positioned supine with arms tucked. SCDs were active during the entire case. The patient underwent general anesthesia without any difficulty.  The abdomen was prepped and draped in a sterile fashion. A Surgical Timeout confirmed our plan.  I made a transverse curvilinear incision through the superior umbilical fold.  I placed a 63mm long port through the supraumbilical fascia using a modified Hassan cutdown technique with umbilical stalk fascial countertraction. I began carbon dioxide insufflation.  No change in end tidal CO2 measurement.   Camera inspection revealed no injury. There were no adhesions to the anterior abdominal wall supraumbilically.  I proceeded to continue with single site technique. I placed a #5 port in left upper aspect of the wound. I placed a 5 mm atraumatic grasper in the right inferior aspect of the wound.  Did harmonic lyse adhesions to free some greater omentum off the falciform ligament exposure.  One grasper was involved with the omentum but inspection was done and confirmed no bowel injury other  I turned attention to the right upper quadrant.   I freed adhesions of greater omentum off  the gallbladder to expose it.  Inspected to confirm transverse colon was out of the way.  The gallbladder fundus was elevated  cephalad. I freed adhesions to the ventral surface of the gallbladder off carefully.  Freed off mesocolon greater omentum and duodenal bulb adhesions to the gallbladder to expose the infundibulum.  I freed the peritoneal coverings between the gallbladder and the liver on the posteriolateral and anteriomedial walls. I alternated between Harmonic & blunt Maryland dissection to help get a good critical view of the cystic artery and cystic duct. I did further dissection to free a few centimeters of the gallbladder off the liver bed to get a good critical view of the infundibulum and cystic duct. I dissected out the cystic artery; and, after getting a good 360 view, ligated the anterior & posterior branches of the cystic artery close on the infundibulum using the Harmonic ultrasonic dissection.  Because of her iodine allergy and normal liver function tests, I did not feel that a cholangiogram was required.  I skeletonized the cystic duct.  I placed clips on the cystic duct x4.   I completed cystic duct transection. I freed the gallbladder from its remaining attachments to the liver. I ensured hemostasis on the gallbladder fossa of the liver and elsewhere. I inspected the rest of the abdomen & detected no injury nor bleeding elsewhere.  I removed the gallbladder out the supraumbilical fascia. I closed the fascia transversely using  #1 PDS interrupted stitches. I closed the skin using 4-0 monocryl stitch.  Sterile dressing was applied. The patient was extubated & arrived in the PACU in stable condition..  I had discussed postoperative care with the patient and her husband in the holding area. I discussed operative findings, updated the patient's status, discussed probable steps to recovery, and gave postoperative recommendations to the patient's spouse.  Recommendations were made.  Questions were answered.  He expressed understanding & appreciation.  Adin Hector, M.D., F.A.C.S. Gastrointestinal and Minimally  Invasive Surgery [facility] [address] [place], [place] [address] [phone] Main / Paging  06/14/2017 3:22 PM

## 2017-06-14 NOTE — Discharge Instructions (Signed)
LAPAROSCOPIC SURGERY: POST OP INSTRUCTIONS ° °###################################################################### ° °EAT °Gradually transition to a high fiber diet with a fiber supplement over the next few weeks after discharge.  Start with a pureed / full liquid diet (see below) ° °WALK °Walk an hour a day.  Control your pain to do that.   ° °CONTROL PAIN °Control pain so that you can walk, sleep, tolerate sneezing/coughing, go up/down stairs. ° °HAVE A BOWEL MOVEMENT DAILY °Keep your bowels regular to avoid problems.  OK to try a laxative to override constipation.  OK to use an antidairrheal to slow down diarrhea.  Call if not better after 2 tries ° °CALL IF YOU HAVE PROBLEMS/CONCERNS °Call if you are still struggling despite following these instructions. °Call if you have concerns not answered by these instructions ° °###################################################################### ° ° ° °1. DIET: Follow a light bland diet the first 24 hours after arrival home, such as soup, liquids, crackers, etc.  Be sure to include lots of fluids daily.  Avoid fast food or heavy meals as your are more likely to get nauseated.  Eat a low fat the next few days after surgery.   °2. Take your usually prescribed home medications unless otherwise directed. °3. PAIN CONTROL: °a. Pain is best controlled by a usual combination of three different methods TOGETHER: °i. Ice/Heat °ii. Over the counter pain medication °iii. Prescription pain medication °b. Most patients will experience some swelling and bruising around the incisions.  Ice packs or heating pads (30-60 minutes up to 6 times a day) will help. Use ice for the first few days to help decrease swelling and bruising, then switch to heat to help relax tight/sore spots and speed recovery.  Some people prefer to use ice alone, heat alone, alternating between ice & heat.  Experiment to what works for you.  Swelling and bruising can take several weeks to resolve.   °c. It is  helpful to take an over-the-counter pain medication regularly for the first few weeks.  Choose one of the following that works best for you: °i. Naproxen (Aleve, etc)  Two 220mg tabs twice a day °ii. Ibuprofen (Advil, etc) Three 200mg tabs four times a day (every meal & bedtime) °iii. Acetaminophen (Tylenol, etc) 500-650mg four times a day (every meal & bedtime) °d. A  prescription for pain medication (such as oxycodone, hydrocodone, etc) should be given to you upon discharge.  Take your pain medication as prescribed.  °i. If you are having problems/concerns with the prescription medicine (does not control pain, nausea, vomiting, rash, itching, etc), please call us [phone] to see if we need to switch you to a different pain medicine that will work better for you and/or control your side effect better. °ii. If you need a refill on your pain medication, please contact your pharmacy.  They will contact our office to request authorization. Prescriptions will not be filled after 5 pm or on week-ends. °4. Avoid getting constipated.  Between the surgery and the pain medications, it is common to experience some constipation.  Increasing fluid intake and taking a fiber supplement (such as Metamucil, Citrucel, FiberCon, MiraLax, etc) 1-2 times a day regularly will usually help prevent this problem from occurring.  A mild laxative (prune juice, Milk of Magnesia, MiraLax, etc) should be taken according to package directions if there are no bowel movements after 48 hours.   °5. Watch out for diarrhea.  If you have many loose bowel movements, simplify your diet to bland foods & liquids for   a few days.  Stop any stool softeners and decrease your fiber supplement.  Switching to mild anti-diarrheal medications (Kayopectate, Pepto Bismol) can help.  If this worsens or does not improve, please call us. °6. Wash / shower every day.  You may shower over the dressings as they are waterproof.  Continue to shower over incision(s)  after the dressing is off. °7. Remove your waterproof bandages 5 days after surgery.  You may leave the incision open to air.  You may replace a dressing/Band-Aid to cover the incision for comfort if you wish.  °8. ACTIVITIES as tolerated:   °a. You may resume regular (light) daily activities beginning the next day--such as daily self-care, walking, climbing stairs--gradually increasing activities as tolerated.  If you can walk 30 minutes without difficulty, it is safe to try more intense activity such as jogging, treadmill, bicycling, low-impact aerobics, swimming, etc. °b. Save the most intensive and strenuous activity for last such as sit-ups, heavy lifting, contact sports, etc  Refrain from any heavy lifting or straining until you are off narcotics for pain control.   °c. DO NOT PUSH THROUGH PAIN.  Let pain be your guide: If it hurts to do something, don't do it.  Pain is your body warning you to avoid that activity for another week until the pain goes down. °d. You may drive when you are no longer taking prescription pain medication, you can comfortably wear a seatbelt, and you can safely maneuver your car and apply brakes. °e. You may have sexual intercourse when it is comfortable.  °9. FOLLOW UP in our office °a. Please call [facility] at [phone] to set up an appointment to see your surgeon in the office for a follow-up appointment approximately 2-3 weeks after your surgery. °b. Make sure that you call for this appointment the day you arrive home to insure a convenient appointment time. °10. IF YOU HAVE DISABILITY OR FAMILY LEAVE FORMS, BRING THEM TO THE OFFICE FOR PROCESSING.  DO NOT GIVE THEM TO YOUR DOCTOR. ° ° °WHEN TO CALL US [phone]: °1. Poor pain control °2. Reactions / problems with new medications (rash/itching, nausea, etc)  °3. Fever over 101.5 F (38.5 C) °4. Inability to urinate °5. Nausea and/or vomiting °6. Worsening swelling or bruising °7. Continued bleeding from incision. °8. Increased  pain, redness, or drainage from the incision ° ° The clinic staff is available to answer your questions during regular business hours (8:30am-5pm).  Please don’t hesitate to call and ask to speak to one of our nurses for clinical concerns.  ° If you have a medical emergency, go to the nearest emergency room or call 911. ° A surgeon from [facility] is always on call at the hospitals ° ° °[facility] °[address], [address], , [place]  [address] ? °MAIN: [phone] ? TOLL FREE: [phone] ?  °FAX [phone] °[email or web address] ° ° ° °Cholecystitis °Cholecystitis is inflammation of the gallbladder. It is often called a gallbladder attack. The gallbladder is a pear-shaped organ that lies beneath the liver on the right side of the body. The gallbladder stores bile, which is a fluid that helps the body to digest fats. If bile builds up in your gallbladder, your gallbladder becomes inflamed. This condition may occur suddenly (be acute). Repeat episodes of acute cholecystitis or prolonged episodes may lead to a long-term (chronic) condition. Cholecystitis is serious and it requires treatment. °What are the causes? °The most common cause of this   condition is gallstones. Gallstones can block the tube (duct) that carries bile out of your gallbladder. This causes bile to build up. Other causes of this condition include: °· Damage to the gallbladder due to a decrease in blood flow. °· Infections in the bile ducts. °· Scars or kinks in the bile ducts. °· Tumors in the liver, pancreas, or gallbladder. ° °What increases the risk? °This condition is more likely to develop in: °· People who have sickle cell disease. °· People who take birth control pills or use estrogen. °· People who have alcoholic liver disease. °· People who have liver cirrhosis. °· People who have their nutrition delivered through a vein (parenteral nutrition). °· People who do not eat or drink  (do fasting) for a long period of time. °· People who are obese. °· People who have rapid weight loss. °· People who are pregnant. °· People who have increased triglyceride levels. °· People who have pancreatitis. ° °What are the signs or symptoms? °Symptoms of this condition include: °· Abdominal pain, especially in the upper right area of the abdomen. °· Abdominal tenderness or bloating. °· Nausea. °· Vomiting. °· Fever. °· Chills. °· Yellowing of the skin and the whites of the eyes (jaundice). ° °How is this diagnosed? °This condition is diagnosed with a medical history and physical exam. You may also have other tests, including: °· Imaging tests, such as: °? An ultrasound of the gallbladder. °? A CT scan of the abdomen. °? A gallbladder nuclear scan (HIDA scan). This scan allows your health care provider to see the bile moving from your liver to your gallbladder and to your small intestine. °? MRI. °· Blood tests, such as: °? A complete blood count, because the white blood cell count may be higher than normal. °? Liver function tests, because some levels may be higher than normal with certain types of gallstones. ° °How is this treated? °Treatment may include: °· Fasting for a certain amount of time. °· IV fluids. °· Medicine to treat pain or vomiting. °· Antibiotic medicine. °· Surgery to remove your gallbladder (cholecystectomy). This may happen immediately or at a later time. ° °Follow these instructions at home: °Home care will depend on your treatment. In general: °· Take over-the-counter and prescription medicines only as told by your health care provider. °· If you were prescribed an antibiotic medicine, take it as told by your health care provider. Do not stop taking the antibiotic even if you start to feel better. °· Follow instructions from your health care provider about what to eat or drink. When you are allowed to eat, avoid eating or drinking anything that triggers your symptoms. °· Keep all  follow-up visits as told by your health care provider. This is important. ° °Contact a health care provider if: °· Your pain is not controlled with medicine. °· You have a fever. °Get help right away if: °· Your pain moves to another part of your abdomen or to your back. °· You continue to have symptoms or you develop new symptoms even with treatment. °This information is not intended to replace advice given to you by your health care provider. Make sure you discuss any questions you have with your health care provider. °Document Released: 05/03/2005 Document Revised: 09/11/2015 Document Reviewed: 08/14/2014 °Elsevier Interactive Patient Education © 2018 [employer] ° °

## 2017-06-14 NOTE — Anesthesia Preprocedure Evaluation (Addendum)
Anesthesia Evaluation  Patient identified by MRN, date of birth, ID band Patient awake    Reviewed: Allergy & Precautions, NPO status , Patient's Chart, lab work & pertinent test results  Airway Mallampati: II  TM Distance: >3 FB Neck ROM: Full    Dental no notable dental hx.    Pulmonary neg pulmonary ROS,    Pulmonary exam normal breath sounds clear to auscultation       Cardiovascular negative cardio ROS Normal cardiovascular exam Rhythm:Regular Rate:Normal     Neuro/Psych negative neurological ROS  negative psych ROS   GI/Hepatic negative GI ROS, Neg liver ROS,   Endo/Other  negative endocrine ROS  Renal/GU negative Renal ROS  negative genitourinary   Musculoskeletal negative musculoskeletal ROS (+)   Abdominal   Peds  Hematology negative hematology ROS (+)   Anesthesia Other Findings   Reproductive/Obstetrics negative OB ROS                             Anesthesia Physical Anesthesia Plan  ASA: II  Anesthesia Plan: General   Post-op Pain Management:    Induction: Intravenous  PONV Risk Score and Plan: Treatment may vary due to age or medical condition, Ondansetron, Dexamethasone and Scopolamine patch - Pre-op  Airway Management Planned: Oral ETT  Additional Equipment:   Intra-op Plan:   Post-operative Plan: Extubation in OR  Informed Consent: I have reviewed the patients History and Physical, chart, labs and discussed the procedure including the risks, benefits and alternatives for the proposed anesthesia with the patient or authorized representative who has indicated his/her understanding and acceptance.   Dental advisory given  Plan Discussed with: CRNA  Anesthesia Plan Comments:         Anesthesia Quick Evaluation

## 2017-06-15 ENCOUNTER — Encounter (HOSPITAL_COMMUNITY): Payer: Self-pay | Admitting: Surgery

## 2017-06-23 ENCOUNTER — Ambulatory Visit: Payer: Self-pay | Admitting: Internal Medicine

## 2017-06-24 ENCOUNTER — Inpatient Hospital Stay (HOSPITAL_COMMUNITY): Admission: RE | Admit: 2017-06-24 | Payer: Self-pay | Source: Ambulatory Visit

## 2017-08-18 ENCOUNTER — Other Ambulatory Visit: Payer: Self-pay | Admitting: Obstetrics & Gynecology

## 2017-08-18 DIAGNOSIS — Z87898 Personal history of other specified conditions: Secondary | ICD-10-CM

## 2017-08-22 ENCOUNTER — Ambulatory Visit
Admission: RE | Admit: 2017-08-22 | Discharge: 2017-08-22 | Disposition: A | Discharge status: Home / Self Care | Payer: BLUE CROSS/BLUE SHIELD | Source: Ambulatory Visit | Attending: Obstetrics & Gynecology | Admitting: Obstetrics & Gynecology

## 2017-08-22 ENCOUNTER — Other Ambulatory Visit: Payer: Self-pay | Admitting: Obstetrics & Gynecology

## 2017-08-22 DIAGNOSIS — Z87898 Personal history of other specified conditions: Secondary | ICD-10-CM

## 2017-08-22 DIAGNOSIS — N6320 Unspecified lump in the left breast, unspecified quadrant: Secondary | ICD-10-CM

## 2017-08-23 ENCOUNTER — Other Ambulatory Visit: Payer: Self-pay | Admitting: Obstetrics & Gynecology

## 2017-08-23 ENCOUNTER — Ambulatory Visit
Admission: RE | Admit: 2017-08-23 | Discharge: 2017-08-23 | Disposition: A | Discharge status: Home / Self Care | Payer: BLUE CROSS/BLUE SHIELD | Source: Ambulatory Visit | Attending: Obstetrics & Gynecology | Admitting: Obstetrics & Gynecology

## 2017-08-23 DIAGNOSIS — N6320 Unspecified lump in the left breast, unspecified quadrant: Secondary | ICD-10-CM

## 2018-04-24 ENCOUNTER — Other Ambulatory Visit: Payer: Self-pay | Admitting: Obstetrics & Gynecology

## 2018-04-24 DIAGNOSIS — N644 Mastodynia: Secondary | ICD-10-CM

## 2018-04-24 LAB — HM PAP SMEAR

## 2018-05-01 ENCOUNTER — Ambulatory Visit: Payer: Self-pay

## 2018-05-01 ENCOUNTER — Ambulatory Visit
Admission: RE | Admit: 2018-05-01 | Discharge: 2018-05-01 | Disposition: A | Discharge status: Home / Self Care | Payer: BLUE CROSS/BLUE SHIELD | Source: Ambulatory Visit | Attending: Obstetrics & Gynecology | Admitting: Obstetrics & Gynecology

## 2018-05-01 DIAGNOSIS — N644 Mastodynia: Secondary | ICD-10-CM

## 2018-06-05 ENCOUNTER — Emergency Department (HOSPITAL_COMMUNITY): Payer: BLUE CROSS/BLUE SHIELD

## 2018-06-05 ENCOUNTER — Encounter (HOSPITAL_COMMUNITY): Payer: Self-pay | Admitting: *Deleted

## 2018-06-05 ENCOUNTER — Emergency Department (HOSPITAL_COMMUNITY)
Admission: EM | Admit: 2018-06-05 | Discharge: 2018-06-05 | Disposition: A | Discharge status: Home / Self Care | Payer: BLUE CROSS/BLUE SHIELD | Attending: Emergency Medicine | Admitting: Emergency Medicine

## 2018-06-05 DIAGNOSIS — N390 Urinary tract infection, site not specified: Secondary | ICD-10-CM

## 2018-06-05 DIAGNOSIS — R1011 Right upper quadrant pain: Secondary | ICD-10-CM | POA: Insufficient documentation

## 2018-06-05 DIAGNOSIS — J45909 Unspecified asthma, uncomplicated: Secondary | ICD-10-CM | POA: Diagnosis not present

## 2018-06-05 DIAGNOSIS — F909 Attention-deficit hyperactivity disorder, unspecified type: Secondary | ICD-10-CM | POA: Insufficient documentation

## 2018-06-05 DIAGNOSIS — Z79899 Other long term (current) drug therapy: Secondary | ICD-10-CM | POA: Diagnosis not present

## 2018-06-05 LAB — URINALYSIS, ROUTINE W REFLEX MICROSCOPIC
BILIRUBIN URINE: NEGATIVE
Glucose, UA: NEGATIVE mg/dL
Hgb urine dipstick: NEGATIVE
Ketones, ur: 20 mg/dL — AB
Nitrite: POSITIVE — AB
Protein, ur: NEGATIVE mg/dL
Specific Gravity, Urine: 1.021 (ref 1.005–1.030)
WBC, UA: >50 WBC/hpf — ABNORMAL HIGH (ref 0–5)
pH: 8 (ref 5.0–8.0)

## 2018-06-05 LAB — COMPREHENSIVE METABOLIC PANEL
ALT: 23 U/L (ref 0–44)
AST: 27 U/L (ref 15–41)
Albumin: 4.2 g/dL (ref 3.5–5.0)
Alkaline Phosphatase: 62 U/L (ref 38–126)
Anion gap: 12 (ref 5–15)
BUN: 10 mg/dL (ref 6–20)
CO2: 23 mmol/L (ref 22–32)
Calcium: 9.9 mg/dL (ref 8.9–10.3)
Chloride: 102 mmol/L (ref 98–111)
Creatinine, Ser: 1.21 mg/dL — ABNORMAL HIGH (ref 0.44–1.00)
GFR calc Af Amer: >60 mL/min (ref >60)
GFR calc non Af Amer: 53 mL/min — ABNORMAL LOW (ref >60)
Glucose, Bld: 102 mg/dL — ABNORMAL HIGH (ref 70–99)
Potassium: 3.8 mmol/L (ref 3.5–5.1)
Sodium: 137 mmol/L (ref 135–145)
Total Bilirubin: 0.8 mg/dL (ref 0.3–1.2)
Total Protein: 7.4 g/dL (ref 6.5–8.1)

## 2018-06-05 LAB — CBC
HEMATOCRIT: 43.2 % (ref 36.0–46.0)
HEMOGLOBIN: 14.2 g/dL (ref 12.0–15.0)
MCH: 28.9 pg (ref 26.0–34.0)
MCHC: 32.9 g/dL (ref 30.0–36.0)
MCV: 87.8 fL (ref 80.0–100.0)
Platelets: 225 10*3/uL (ref 150–400)
RBC: 4.92 MIL/uL (ref 3.87–5.11)
RDW: 11.7 % (ref 11.5–15.5)
WBC: 8.4 10*3/uL (ref 4.0–10.5)
nRBC: 0 % (ref 0.0–0.2)

## 2018-06-05 LAB — I-STAT BETA HCG BLOOD, ED (MC, WL, AP ONLY): I-stat hCG, quantitative: <5 m[IU]/mL (ref <5)

## 2018-06-05 LAB — LIPASE, BLOOD: Lipase: 29 U/L (ref 11–51)

## 2018-06-05 MED ORDER — ONDANSETRON 4 MG PO TBDP: 4.0000 mg | ORAL_TABLET | Freq: Three times a day (TID) | ORAL | 0 refills | Status: DC | PRN

## 2018-06-05 MED ORDER — SODIUM CHLORIDE 0.9% FLUSH
3.0000 mL | Freq: Once | INTRAVENOUS | Status: AC
  Administered 2018-06-05: 3 mL via INTRAVENOUS

## 2018-06-05 MED ORDER — KETOROLAC TROMETHAMINE 30 MG/ML IJ SOLN
30.0000 mg | Freq: Once | INTRAMUSCULAR | Status: AC
  Administered 2018-06-05: 30 mg via INTRAVENOUS
  Filled 2018-06-05: qty 1

## 2018-06-05 MED ORDER — HYDROCODONE-ACETAMINOPHEN 5-325 MG PO TABS: 2.0000 | ORAL_TABLET | ORAL | 0 refills | Status: DC | PRN

## 2018-06-05 MED ORDER — ONDANSETRON HCL 4 MG/2ML IJ SOLN
4.0000 mg | Freq: Once | INTRAMUSCULAR | Status: AC
  Administered 2018-06-05: 4 mg via INTRAVENOUS
  Filled 2018-06-05: qty 2

## 2018-06-05 MED ORDER — HYDROCORTISONE NA SUCCINATE PF 250 MG IJ SOLR
200.0000 mg | Freq: Once | INTRAMUSCULAR | Status: AC
  Administered 2018-06-05: 200 mg via INTRAVENOUS
  Filled 2018-06-05: qty 200

## 2018-06-05 MED ORDER — HYDROMORPHONE HCL 1 MG/ML IJ SOLN
0.5000 mg | Freq: Once | INTRAMUSCULAR | Status: DC
  Filled 2018-06-05: qty 1

## 2018-06-05 MED ORDER — DIPHENHYDRAMINE HCL 50 MG/ML IJ SOLN: 50.0000 mg | Freq: Once | INTRAMUSCULAR | Status: DC

## 2018-06-05 MED ORDER — HYDROMORPHONE HCL 1 MG/ML IJ SOLN
0.5000 mg | Freq: Once | INTRAMUSCULAR | Status: AC
  Administered 2018-06-05: 0.5 mg via INTRAVENOUS
  Filled 2018-06-05 (×2): qty 1

## 2018-06-05 MED ORDER — IBUPROFEN 600 MG PO TABS: 600.0000 mg | ORAL_TABLET | Freq: Four times a day (QID) | ORAL | 0 refills | Status: DC | PRN

## 2018-06-05 MED ORDER — LEVOFLOXACIN 750 MG PO TABS: 750.0000 mg | ORAL_TABLET | Freq: Every day | ORAL | 0 refills | Status: DC

## 2018-06-05 MED ORDER — LORAZEPAM 2 MG/ML IJ SOLN
0.5000 mg | Freq: Once | INTRAMUSCULAR | Status: AC
  Administered 2018-06-05: 0.5 mg via INTRAVENOUS
  Filled 2018-06-05: qty 1

## 2018-06-05 MED ORDER — HYDROMORPHONE HCL 1 MG/ML IJ SOLN
0.5000 mg | Freq: Once | INTRAMUSCULAR | Status: AC
  Administered 2018-06-05: 0.5 mg via INTRAVENOUS

## 2018-06-05 MED ORDER — LEVOFLOXACIN IN D5W 750 MG/150ML IV SOLN
750.0000 mg | Freq: Once | INTRAVENOUS | Status: AC
  Administered 2018-06-05: 750 mg via INTRAVENOUS
  Filled 2018-06-05: qty 150

## 2018-06-05 MED ORDER — DIPHENHYDRAMINE HCL 25 MG PO CAPS: 50.0000 mg | ORAL_CAPSULE | Freq: Once | ORAL | Status: DC

## 2018-06-05 NOTE — ED Triage Notes (Signed)
Pt in c/o severe right flank pain that started this morning, has worsened throughout the day, reports nausea but denies vomiting, pain comes in waves and becomes suddenly severe

## 2018-06-05 NOTE — ED Notes (Signed)
Patient verbalizes understanding of discharge instructions. Opportunity for questioning and answers were provided. Armband removed by staff, pt discharged from ED in wheelchair.  

## 2018-06-05 NOTE — ED Notes (Signed)
Patient reports her pain comes in waves and it is now in her R mid back and epigastric area, states is feels "like contractions."

## 2018-06-05 NOTE — Discharge Instructions (Signed)
Thankfully your testing today showed a urinary tract infection but no other signs of severe abnormalities.  Please take the following medications  Levaquin, once daily for the next 7 days Ibuprofen for pain, Zofran for nausea Hydrocodone for severe pain  Seek emergency medical care for any ongoing severe or worsening symptoms otherwise see your doctor in 48 hours for recheck

## 2018-06-05 NOTE — ED Provider Notes (Addendum)
[facility] Provider Note   CSN: [id number] Arrival date & time: 06/05/18  1351     History   Chief Complaint Chief Complaint  Patient presents with  . Flank Pain    HPI [facility] is a 48 y.o. female.   HPI   48 year old female presents today with complaints of flank pain.  Patient notes this morning she woke up and had a slight ache in her stomach.  She notes taking some antidiarrheal prior to going to play tennis.  Patient notes she has had sharp pain coming in waves to her right upper right lower abdomen and flank.  She notes 2 weeks ago she fell and she had a urinary tract infection with increased urinary frequency, she took AZO which improved her symptoms.  She feels nauseated at this time.  She denies any fever.  She notes a history of kidney stones but notes this feels slightly different.  She is status post cholecystectomy, also status post gastric sleeve.  Past Medical History:  Diagnosis Date  . ADD (attention deficit disorder)   . Allergy   . Asthma   . Chicken pox   . Depression   . PCOS (polycystic ovarian syndrome)   . Renal disorder    kidney disease  . 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 Shingles     Patient Active Problem List   Diagnosis Date Noted  . Varicose veins of leg with complications 19/37/9024  . Varicose veins of lower extremities with other complications 09/73/5329  . Vertigo 12/23/2011  . Otalgia of both ears 09/17/2010  . SLEEP DISORDER/DISTURBANCE 09/27/2008  . FATIGUE 07/15/2008  . ADHD 12/05/2007  . HEADACHE 11/26/2007  . ALLERGIC RHINITIS 10/13/2007  . NEPHROLITHIASIS, HX OF 10/13/2007  . NONSPECIFIC MESENTERIC LYMPHADENITIS 03/17/2007  . RENAL CALCULUS, LEFT 03/17/2007  . DEPRESSION 03/07/2007  . DYSPNEA ON EXERTION 03/07/2007    Past Surgical History:  Procedure Laterality Date  . BUNIONECTOMY    . LAPAROSCOPIC CHOLECYSTECTOMY SINGLE SITE WITH INTRAOPERATIVE CHOLANGIOGRAM N/A 06/14/2017     Procedure: LAPAROSCOPIC CHOLECYSTECTOMY SINGLE SITE;  Surgeon: Michael Boston, MD;  Location: [facility];  Service: General;  Laterality: N/A;  . LAPAROSCOPIC GASTRIC SLEEVE RESECTION  09/2013  . LASER ABLATION     Vascular  . TONSILLECTOMY    . WISDOM TOOTH EXTRACTION       OB History    Gravida  2   Para  2   Term  1   Preterm  1   AB      Living        SAB      TAB      Ectopic      Multiple      Live Births               Home Medications    Prior to Admission medications   Medication Sig Start Date End Date Taking? Authorizing Provider  acetaminophen (TYLENOL) 325 MG tablet Take 325-650 mg by mouth every 6 (six) hours as needed for mild pain or moderate pain.    [provider]  Amphetamine Sulfate (EVEKEO) 10 MG TABS Take 10 mg by mouth daily.    [provider]  clindamycin (CLEOCIN T) 1 % lotion Apply 1 application topically once a week.    [provider]  cyclobenzaprine (FLEXERIL) 10 MG tablet Take 10 mg by mouth daily as needed for muscle spasms.     [provider]  EPINEPHrine 0.3 mg/0.3 mL IJ SOAJ injection Inject 0.3 mLs into the skin daily as needed (allergic reactions).     [provider]  escitalopram (LEXAPRO) 20 MG tablet Take 20 mg by mouth at bedtime.  12/10/13   Dennard Nip, NP  ferrous fumarate (FERRO-SEQUELS) 18 MG CR tablet Take 18 mg by mouth daily.    [provider]  fexofenadine (ALLEGRA) 180 MG tablet Take 180 mg by mouth at bedtime.     [provider]  JUNEL FE 1/20 1-20 MG-MCG tablet Take 1 tablet by mouth daily. 06/02/17   [provider]  montelukast (SINGULAIR) 10 MG tablet Take 10 mg by mouth at bedtime.    [provider]  Multiple Vitamin (MULTIVITAMIN) tablet Take 1 tablet by mouth daily.      [provider]  ondansetron (ZOFRAN-ODT) 4 MG disintegrating tablet Take 4 mg by mouth every 8 (eight) hours as needed for nausea or vomiting.     [provider]  spironolactone (ALDACTONE) 100 MG tablet Take 200 mg by mouth daily.     [provider]  traMADol (ULTRAM) 50 MG tablet Take 1-2 tablets (50-100 mg total) by mouth every 6 (six) hours as needed for moderate pain or severe pain. 06/14/17   Michael Boston, MD  traZODone (DESYREL) 100 MG tablet Take 200 mg by mouth at bedtime.  11/03/13   Luana Shu, Meridith, NP  tretinoin (RETIN-A) 0.025 % cream Apply 1 application topically daily as needed.    [provider]  triamcinolone (NASACORT ALLERGY 24HR) 55 MCG/ACT AERO nasal inhaler Place 1 spray into the nose daily.    [provider]    Family History Family History  Problem Relation Age of Onset  . Varicose Veins Mother   . Asthma Mother   . Varicose Veins Sister   . Birth defects Maternal Aunt        Cognitively Challenged  . Miscarriages / Stillbirths Maternal Aunt   . Varicose Veins Maternal Aunt   . Cancer Paternal Aunt        Melanoma  . Hearing loss Paternal Aunt   . Varicose Veins Paternal Aunt   . Arthritis Maternal Grandmother   . Arthritis Paternal Grandfather   . Hearing loss Paternal Grandfather     Social History Social History   Tobacco Use  . Smoking status: Never Smoker  . Smokeless tobacco: Never Used  Substance Use Topics  . Alcohol use: Yes    Alcohol/week: 0.0 standard drinks    Comment: occasional  . Drug use: No     Allergies   Contrast media [iodinated diagnostic agents]; Gadolinium derivatives; Iohexol; and Penicillins   Review of Systems Review of Systems  All other systems reviewed and are negative.   Physical Exam Updated Vital Signs BP 103/62 (BP Location: Right Arm)   Pulse 93   Temp 98.4 F (36.9 C) (Oral)   Resp 20   SpO2 100%   Physical Exam Vitals signs and nursing note reviewed.  Constitutional:      Appearance: She is well-developed.  HENT:     Head: Normocephalic and atraumatic.  Eyes:     General: No scleral icterus.        Right eye: No discharge.        Left eye: No discharge.     Conjunctiva/sclera: Conjunctivae normal.     Pupils: Pupils are equal, round, and reactive to light.  Neck:     Musculoskeletal: Normal range of motion.  Vascular: No JVD.     Trachea: No tracheal deviation.  Pulmonary:     Effort: Pulmonary effort is normal.     Breath sounds: No stridor.  Abdominal:     Comments: Tenderness palpation of the right upper and mid abdomen, minimal tenderness to right lower, remainder of abdomen soft nontender  Neurological:     Mental Status: She is alert and oriented to person, place, and time.     Coordination: Coordination normal.  Psychiatric:        Behavior: Behavior normal.        Thought Content: Thought content normal.        Judgment: Judgment normal.      ED Treatments / Results  Labs (all labs ordered are listed, but only abnormal results are displayed) Labs Reviewed  COMPREHENSIVE METABOLIC PANEL - Abnormal; Notable for the following components:      Result Value   Glucose, Bld 102 (*)    Creatinine, Ser 1.21 (*)    GFR calc non Af Amer 53 (*)    All other components within normal limits  LIPASE, BLOOD  CBC  URINALYSIS, ROUTINE W REFLEX MICROSCOPIC  I-STAT BETA HCG BLOOD, ED ([facility], [facility], [facility] ONLY)    EKG None  Radiology Ct Renal Stone Study  Result Date: 06/05/2018 CLINICAL DATA:  Pt in c/o severe right flank pain that started this morning, has worsened throughout the day, reports nausea but denies vomiting, pain comes in waves and becomes suddenly severe EXAM: CT ABDOMEN AND PELVIS WITHOUT CONTRAST TECHNIQUE: Multidetector CT imaging of the abdomen and pelvis was performed following the standard protocol without IV contrast. COMPARISON:  05/21/2017, and previous FINDINGS: Lower chest: No acute abnormality. Hepatobiliary: No focal liver abnormality is seen. Status post cholecystectomy. No biliary dilatation. Pancreas: Unremarkable. No pancreatic ductal dilatation  or surrounding inflammatory changes. Spleen: Normal in size without focal abnormality. Adrenals/Urinary Tract: Normal adrenals. Normal right kidney. 2 mm calcification in the mid left renal collecting system. No hydronephrosis or ureteral calculus. Urinary bladder incompletely distended. Stomach/Bowel: Previous partial gastrectomy. Gastric remnant and small bowel are nondistended. Normal appendix. The colon is nondilated, unremarkable. Vascular/Lymphatic: No significant vascular findings are present. No enlarged abdominal or pelvic lymph nodes. Reproductive: Uterus and bilateral adnexa are unremarkable. Other: Bilateral pelvic phleboliths. No ascites. No free air. Musculoskeletal: No acute or significant osseous findings. IMPRESSION: 1. No acute findings. 2. Left nephrolithiasis without hydronephrosis. Electronically Signed   By: Lucrezia Europe M.D.   On: 06/05/2018 15:15    Procedures Procedures (including critical care time)  Medications Ordered in ED Medications  sodium chloride flush (NS) 0.9 % injection 3 mL (has no administration in time range)  ketorolac (TORADOL) 30 MG/ML injection 30 mg (30 mg Intravenous Given 06/05/18 1403)  ondansetron (ZOFRAN) injection 4 mg (4 mg Intravenous Given 06/05/18 1403)  HYDROmorphone (DILAUDID) injection 0.5 mg (0.5 mg Intravenous Given 06/05/18 1450)  HYDROmorphone (DILAUDID) injection 0.5 mg (0.5 mg Intravenous Given 06/05/18 1534)     Initial Impression / Assessment and Plan / ED Course  I have reviewed the triage vital signs and the nursing notes.  Pertinent labs & imaging results that were available during my care of the patient were reviewed by me and considered in my medical decision making (see chart for details).    Labs: I-STAT beta-hCG, lipase, CMP, CBC  Imaging: CT renal  Consults:   Therapeutics: Toradol, Dilaudid  Discharge Meds:   Assessment/Plan: 48 year old female presents today with complaints of abdominal flank  pain.  I have high  suspicion for nephrolithiasis.  Although appendicitis diverticulitis choledocholithiasis on my differential I find these less likely.  Patient was given Toradol which temporarily improved her symptoms but recurrence of severe waves of sharp pain.  Patient be given Dilaudid.  She will undergo CT scan at this time.  CT results show no acute abnormalities.  Discussed case with attending physician Dr. Sabra Heck at shift change he will assume care at this time.  Patient does have a contrast allergy.  She notes hives to her arms.  I discussed the risks and benefits of proceeding with contrast at this time.  Patient agrees that contrast imaging would be her best interest.    Final Clinical Impressions(s) / ED Diagnoses   Final diagnoses:  Right upper quadrant abdominal pain    ED Discharge Orders    None       Francee Gentile 06/05/18 1546    Noemi Chapel, MD 06/05/18 1600    Okey Regal, PA-C 06/05/18 1601    Noemi Chapel, MD 06/05/18 Senaida Lange, MD 06/05/18 1759

## 2018-06-05 NOTE — ED Notes (Signed)
Patient transported to CT 

## 2018-06-05 NOTE — ED Notes (Signed)
Patient unable to provide urine sample at this time, EDP provided with ice chips.

## 2018-06-08 LAB — URINE CULTURE: Culture: >=100000 — AB

## 2018-06-08 NOTE — Progress Notes (Signed)
Chief Complaint  Patient presents with  . Follow-up    Seen in ED 06/05/2018 for RUQ pain. Has not felt good since d/c home and today is the first day she has been out of bed since coming home.     HPI: Jordan Mayer 48 y.o. come in for FU ed for RUQ pain  From  Jun 05 2008   Had uti sx frequency the week abefore and azxo  Helped and then came back   The day of ed visit  Played tennis and then abd pain  Progressed.  On 7 days of levaquin  Has 2 more days.  Left  About 50 +% better  Still right abd  Pain is  A soreness . Like punched.  No urinary sx  No vomiting but nausea using Zofran as possible  ROS: See pertinent positives and negatives per HPI.  Past Medical History:  Diagnosis Date  . ADD (attention deficit disorder)   . Allergy   . Asthma   . Chicken pox   . Depression   . PCOS (polycystic ovarian syndrome)   . Renal disorder    kidney disease  . 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 Shingles     Family History  Problem Relation Age of Onset  . Varicose Veins Mother   . Asthma Mother   . Varicose Veins Sister   . Birth defects Maternal Aunt        Cognitively Challenged  . Miscarriages / Stillbirths Maternal Aunt   . Varicose Veins Maternal Aunt   . Cancer Paternal Aunt        Melanoma  . Hearing loss Paternal Aunt   . Varicose Veins Paternal Aunt   . Arthritis Maternal Grandmother   . Arthritis Paternal Grandfather   . Hearing loss Paternal Grandfather     Social History   Socioeconomic History  . Marital status: Married    Spouse name: Not on file  . Number of children: Not on file  . Years of education: Not on file  . Highest education level: Not on file  Occupational History  . Occupation: Nurse, adult  Social Needs  . Financial resource strain: Not on file  . Food insecurity:    Worry: Not on file    Inability: Not on file  . Transportation needs:    Medical: Not on file    Non-medical: Not on file  Tobacco Use  . Smoking status: Never  Smoker  . Smokeless tobacco: Never Used  Substance and Sexual Activity  . Alcohol use: Yes    Alcohol/week: 0.0 standard drinks    Comment: occasional  . Drug use: No  . Sexual activity: Yes    Birth control/protection: I.U.D.  Lifestyle  . Physical activity:    Days per week: Not on file    Minutes per session: Not on file  . Stress: Not on file  Relationships  . Social connections:    Talks on phone: Not on file    Gets together: Not on file    Attends religious service: Not on file    Active member of club or organization: Not on file    Attends meetings of clubs or organizations: Not on file    Relationship status: Not on file  Other Topics Concern  . 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 Amphetamine Sulfate (EVEKEO) 10 MG TABS Take 10 mg by mouth daily.    . clindamycin (CLEOCIN T) 1 % lotion Apply 1 application topically once a week.    . cyclobenzaprine (FLEXERIL) 10 MG tablet Take 10 mg by mouth daily as needed for muscle spasms.     Marland Kitchen EPINEPHrine 0.3 mg/0.3 mL IJ SOAJ injection Inject 0.3 mLs into the skin daily as needed (allergic reactions).     Marland Kitchen levofloxacin (LEVAQUIN) 750 MG tablet Take 1 tablet (750 mg total) by mouth daily. 7 tablet 0  . montelukast (SINGULAIR) 10 MG tablet Take 10 mg by mouth at bedtime.    . Multiple Vitamin (MULTIVITAMIN) tablet Take 1 tablet by mouth daily.      Marland Kitchen spironolactone (ALDACTONE) 100 MG tablet Take 200 mg by mouth daily.     . traZODone (DESYREL) 100 MG tablet Take 200 mg by mouth at bedtime.     . tretinoin (RETIN-A) 0.025 % cream Apply 1 application topically daily as needed.    . triamcinolone (NASACORT ALLERGY 24HR) 55 MCG/ACT AERO nasal inhaler Place 1 spray into the nose daily.    . ondansetron (ZOFRAN ODT) 4 MG  disintegrating tablet Take 1 tablet (4 mg total) by mouth every 8 (eight) hours as needed for nausea. 10 tablet 0  . acetaminophen (TYLENOL) 325 MG tablet Take 325-650 mg by mouth every 6 (six) hours as needed for mild pain or moderate pain.    Marland Kitchen escitalopram (LEXAPRO) 20 MG tablet Take 20 mg by mouth at bedtime.     . ferrous fumarate (FERRO-SEQUELS) 18 MG CR tablet Take 18 mg by mouth daily.    . fexofenadine (ALLEGRA) 180 MG tablet Take 180 mg by mouth at bedtime.     Marland Kitchen HYDROcodone-acetaminophen (NORCO/VICODIN) 5-325 MG tablet Take 2 tablets by mouth every 4 (four) hours as needed. (Patient not taking: Reported on 06/09/2018) 6 tablet 0  . ibuprofen (ADVIL,MOTRIN) 600 MG tablet Take 1 tablet (600 mg total) by mouth every 6 (six) hours as needed. (Patient not taking: Reported on 06/09/2018) 30 tablet 0  . JUNEL FE 1/20 1-20 MG-MCG tablet Take 1 tablet by mouth daily.  4  . traMADol (ULTRAM) 50 MG tablet Take 1-2 tablets (50-100 mg total) by mouth every 6 (six) hours as needed for moderate pain or severe pain. (Patient not taking: Reported on 06/09/2018) 20 tablet 0   No facility-administered medications prior to visit.      EXAM:  BP (!) 90/58 (BP Location: Right Arm, Patient Position: Sitting, Cuff Size: Normal)   Pulse 72   Temp 98.2 F (36.8 C) (Oral)   Wt 151 lb 4.8 oz (68.6 kg)   BMI 25.18 kg/m   Body mass index is 25.18 kg/m.  GENERAL: vitals reviewed and listed above, alert, oriented, appears well hydrated and in no acute distress HEENT: atraumatic, conjunctiva  clear, no obvious abnormalities on inspection of external nose and ears  NECK: no obvious masses on inspection palpation  LUNGS: clear to auscultation bilaterally, no wheezes, rales or rhonchi, good air movement CV: HRRR, no clubbing cyanosis or  peripheral edema nl cap refill  Abdomen:  Sof,t normal bowel sounds without hepatosplenomegaly, no guarding rebound or masses no CVA tenderness tender right mid abd but no mass  and rebound   MS: moves all extremities without noticeable focal  abnormality PSYCH: pleasant and  cooperative, no obvious depression or anxiety Lab Results  Component Value Date   WBC 8.4 06/05/2018   HGB 14.2 06/05/2018   HCT 43.2 06/05/2018   PLT 225 06/05/2018   GLUCOSE 96 06/09/2018   CHOL 154 07/18/2008   TRIG 105 07/18/2008   HDL 31.7 (L) 07/18/2008   LDLCALC 101 (H) 07/18/2008   ALT 23 06/05/2018   AST 27 06/05/2018   NA 142 06/09/2018   K 3.9 06/09/2018   CL 106 06/09/2018   CREATININE 0.90 06/09/2018   BUN 9 06/09/2018   CO2 31 06/09/2018   TSH 1.51 07/18/2008   BP Readings from Last 3 Encounters:  06/09/18 (!) 90/58  06/05/18 114/76  06/14/17 (!) 138/94    ASSESSMENT AND PLAN:  Discussed the following assessment and plan:  Pyelonephritis - Plan: Basic metabolic panel, POCT Urinalysis Dipstick (Automated)  Elevated serum creatinine - Plan: Basic metabolic panel, POCT Urinalysis Dipstick (Automated)  RUQ pain  History of sleeve gastric surgery Much better  But still sig sx  Record reviewed  E coli pan sensitive and elevated creatinine small stone left no hydron. No appendicitis   close fu  Advised   May be having some fatigue and nausea from med?  Hydration may be issue  Working on it refilled  zofran  -Patient advised to return or notify health care team  if  new concerns arise.  Patient Instructions  You have a UTi  Kidney infection  That is improving as excepted.   Can feel tired  For 1-2 weeks .   Checking urine today and  kidney function.   ROV in a week    Or as needed . Standley Brooking. Panosh M.D.

## 2018-06-09 ENCOUNTER — Ambulatory Visit (INDEPENDENT_AMBULATORY_CARE_PROVIDER_SITE_OTHER): Payer: BLUE CROSS/BLUE SHIELD | Admitting: Internal Medicine

## 2018-06-09 ENCOUNTER — Telehealth: Payer: Self-pay | Admitting: *Deleted

## 2018-06-09 ENCOUNTER — Encounter: Payer: Self-pay | Admitting: Internal Medicine

## 2018-06-09 VITALS — BP 90/58 | HR 72 | Temp 98.2°F | Wt 151.3 lb

## 2018-06-09 DIAGNOSIS — R7989 Other specified abnormal findings of blood chemistry: Secondary | ICD-10-CM | POA: Diagnosis not present

## 2018-06-09 DIAGNOSIS — N12 Tubulo-interstitial nephritis, not specified as acute or chronic: Secondary | ICD-10-CM | POA: Diagnosis not present

## 2018-06-09 DIAGNOSIS — Z903 Acquired absence of stomach [part of]: Secondary | ICD-10-CM

## 2018-06-09 DIAGNOSIS — R1011 Right upper quadrant pain: Secondary | ICD-10-CM | POA: Diagnosis not present

## 2018-06-09 LAB — BASIC METABOLIC PANEL
BUN: 9 mg/dL (ref 6–23)
CO2: 31 mEq/L (ref 19–32)
Calcium: 9.1 mg/dL (ref 8.4–10.5)
Chloride: 106 mEq/L (ref 96–112)
Creatinine, Ser: 0.9 mg/dL (ref 0.40–1.20)
GFR: 66.86 mL/min (ref >60.00)
GLUCOSE: 96 mg/dL (ref 70–99)
Potassium: 3.9 mEq/L (ref 3.5–5.1)
Sodium: 142 mEq/L (ref 135–145)

## 2018-06-09 LAB — POC URINALSYSI DIPSTICK (AUTOMATED)
Bilirubin, UA: POSITIVE
Blood, UA: NEGATIVE
Glucose, UA: NEGATIVE
Leukocytes, UA: NEGATIVE
Nitrite, UA: NEGATIVE
PH UA: 6 (ref 5.0–8.0)
Protein, UA: POSITIVE — AB
Spec Grav, UA: >=1.03 — AB (ref 1.010–1.025)
Urobilinogen, UA: 0.2 E.U./dL

## 2018-06-09 MED ORDER — ONDANSETRON 4 MG PO TBDP: 4.0000 mg | ORAL_TABLET | Freq: Three times a day (TID) | ORAL | 0 refills | Status: DC | PRN

## 2018-06-09 NOTE — Patient Instructions (Signed)
You have a UTi  Kidney infection  That is improving as excepted.   Can feel tired  For 1-2 weeks .   Checking urine today and  kidney function.   ROV in a week    Or as needed .

## 2018-06-09 NOTE — Telephone Encounter (Signed)
Post ED Visit - Positive Culture Follow-up  Culture report reviewed by antimicrobial stewardship pharmacist:  []  Jordan Mayer, Pharm.D. []  Jordan Mayer, Pharm.D., BCPS AQ-ID []  Jordan Mayer, Pharm.D., BCPS []  Jordan Mayer, Pharm.D., BCPS []  Jordan Mayer, [place].D., BCPS, AAHIVP []  Jordan Mayer, Pharm.D., BCPS, AAHIVP [x]  Jordan Mayer, PharmD, BCPS []  Jordan Mayer, PharmD, BCPS []  Jordan Mayer, PharmD, BCPS []  Jordan Mayer, PharmD  Positive urine culture Treated with Levofloxacin, organism sensitive to the same and no further patient follow-up is required at this time.  Jordan Mayer [facility] 06/09/2018, 10:02 AM

## 2018-06-15 ENCOUNTER — Telehealth: Payer: Self-pay | Admitting: *Deleted

## 2018-06-15 NOTE — Telephone Encounter (Signed)
Called and pt states she would not be able to come in earlier due to another appt and would like to keep her appt. At 2

## 2018-06-15 NOTE — Telephone Encounter (Signed)
Pt states that overall she is improving - at times she will have waves of intense pain in abdomen lasting a few minutes.  Pt states that she will "buckle over" Pt states that she went to her child's basketball game and she was almost not able to sit the entire hour -- 2 episodes.  Denies fever -- but has felt feverish, has not taken temp.  Pt c/o a lot of nausea, no vomiting. Little diarrhea a few days ago.  Completed Levaquin x 2 days ago.  Pt still taking Zofran for nausea, helps a lot when she takes it but does not want to rely on them. They wear off very quick.  Pt is still scheduled with Dr Regis Bill 06/16/2018 and plans to keep this appt as long as she is able to make it.

## 2018-06-15 NOTE — Telephone Encounter (Signed)
Per last OV 06/09/2018, pt to return in one week, scheduled 06/16/2018.   Instructions  Return in about 1 week (around 06/16/2018).  You have a UTi  Kidney infection  That is improving as excepted.   Can feel tired  For 1-2 weeks .   Checking urine today and  kidney function.   ROV in a week    Or as needed      Please advise of any recommendations for patient if unable to make it to her appt 06/16/2018  LM for patient to see what symptoms she is having. Worse? Improved? Same?

## 2018-06-15 NOTE — Telephone Encounter (Signed)
What  diesn not feeling well mean ? Fever ? Other ?  Need clinical  Info  to answer the message  She could see another provider today or we can see her earlier in the day  tomorrow   I am not in office this afternoon

## 2018-06-15 NOTE — Telephone Encounter (Signed)
Copied from [place] [id number]. Topic: General - Other >> Jun 15, 2018  8:22 AM Alanda Slim E wrote: Reason for CRM: Pt has an appt tomorrow but is not feeling well. Pt would like Dr. Regis Bill nurse to call her and wants to know what she would advise Pt to do in the mean time if she has to reschedule her appt/ please advise

## 2018-06-16 ENCOUNTER — Ambulatory Visit: Payer: BLUE CROSS/BLUE SHIELD | Admitting: Internal Medicine

## 2018-06-20 ENCOUNTER — Ambulatory Visit (INDEPENDENT_AMBULATORY_CARE_PROVIDER_SITE_OTHER): Payer: BLUE CROSS/BLUE SHIELD | Admitting: Internal Medicine

## 2018-06-20 ENCOUNTER — Ambulatory Visit: Payer: Self-pay | Admitting: *Deleted

## 2018-06-20 ENCOUNTER — Ambulatory Visit: Payer: Self-pay | Admitting: Family Medicine

## 2018-06-20 ENCOUNTER — Encounter: Payer: Self-pay | Admitting: Internal Medicine

## 2018-06-20 VITALS — BP 112/66 | HR 86 | Temp 98.8°F | Wt 147.6 lb

## 2018-06-20 DIAGNOSIS — Z87440 Personal history of urinary (tract) infections: Secondary | ICD-10-CM

## 2018-06-20 DIAGNOSIS — R350 Frequency of micturition: Secondary | ICD-10-CM

## 2018-06-20 LAB — POCT URINALYSIS DIPSTICK
Bilirubin, UA: NEGATIVE
Blood, UA: NEGATIVE
GLUCOSE UA: NEGATIVE
Ketones, UA: NEGATIVE
Leukocytes, UA: NEGATIVE
Nitrite, UA: NEGATIVE
Protein, UA: NEGATIVE
Spec Grav, UA: 1.015 (ref 1.010–1.025)
Urobilinogen, UA: 0.2 E.U./dL
pH, UA: 6 (ref 5.0–8.0)

## 2018-06-20 NOTE — Telephone Encounter (Signed)
Noted  

## 2018-06-20 NOTE — Telephone Encounter (Signed)
Pt seen in ED 06/05/2018; pyelonephritis. Completed course of Levaquin 06/12/2018. Reports similar symptoms, onset this AM. States mild abdominal "Cramping," radiating to lower back and burning with urination. Frequency and urgency but reports "Drinking a lot." Unsure if febrile but "Feels feverish."  Also reports "Pressure at female area."  [facility] spoke with [facility]; appt with Dr. Regis Bill  unblocked for 1445 today. Care advise given per protocol. Directed pt to ED if symptoms worsen.   Reason for Disposition . Urinating more frequently than usual (i.e., frequency)  Answer Assessment - Initial Assessment Questions 1. SYMPTOM: "What's the main symptom you're concerned about?" (e.g., frequency, incontinence)    Dysuria 2. ONSET: "When did the  *No Answer*  start?"     This am 3. PAIN: "Is there any pain?" If so, ask: "How bad is it?" (Scale: 1-10; mild, moderate, severe)     Mild, abdomen and lower back 4. CAUSE: "What do you think is causing the symptoms?"     UTI 5. OTHER SYMPTOMS: "Do you have any other symptoms?" (e.g., fever, flank pain, blood in urine, pain with urination)     "Feel feverish"  Protocols used: URINARY [facility]

## 2018-06-20 NOTE — Progress Notes (Signed)
Chief Complaint  Patient presents with  . Urinary Tract Infection    Pt is having burning when urinating but not every time. Pt is having urinating frequency.     HPI: Jordan Mayer 48 y.o. come in for poss  Recurrent sx   sda   See past notes ed and fu   Has been getting much better    Out and about for 2 days slighjt fatigue  and then  Today ? Frequency  Not sure if coming back .  Less sleep  And then 3 x per day sting  Some  Suprapubic pain . More on right like when went to ed.  No vomiting  Diarrhea  ROS: See pertinent positives and negatives per HPI.  Past Medical History:  Diagnosis Date  . ADD (attention deficit disorder)   . Allergy   . Asthma   . Chicken pox   . Depression   . PCOS (polycystic ovarian syndrome)   . Renal disorder    kidney disease  . 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 Shingles     Family History  Problem Relation Age of Onset  . Varicose Veins Mother   . Asthma Mother   . Varicose Veins Sister   . Birth defects Maternal Aunt        Cognitively Challenged  . Miscarriages / Stillbirths Maternal Aunt   . Varicose Veins Maternal Aunt   . Cancer Paternal Aunt        Melanoma  . Hearing loss Paternal Aunt   . Varicose Veins Paternal Aunt   . Arthritis Maternal Grandmother   . Arthritis Paternal Grandfather   . Hearing loss Paternal Grandfather     Social History   Socioeconomic History  . Marital status: Married    Spouse name: Not on file  . Number of children: Not on file  . Years of education: Not on file  . Highest education level: Not on file  Occupational History  . Occupation: Nurse, adult  Social Needs  . Financial resource strain: Not on file  . Food insecurity:    Worry: Not on file    Inability: Not on file  . Transportation needs:    Medical: Not on file    Non-medical: Not on file  Tobacco Use  . Smoking status: Never Smoker  . Smokeless tobacco: Never Used  Substance and Sexual Activity  . Alcohol use: Yes   Alcohol/week: 0.0 standard drinks    Comment: occasional  . Drug use: No  . Sexual activity: Yes    Birth control/protection: I.U.D.  Lifestyle  . Physical activity:    Days per week: Not on file    Minutes per session: Not on file  . Stress: Not on file  Relationships  . Social connections:    Talks on phone: Not on file    Gets together: Not on file    Attends religious service: Not on file    Active member of club or organization: Not on file    Attends meetings of clubs or organizations: Not on file    Relationship status: Not on file  Other Topics Concern  . 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 Amphetamine Sulfate (EVEKEO)  10 MG TABS Take 10 mg by mouth daily.    . clindamycin (CLEOCIN T) 1 % lotion Apply 1 application topically once a week.    . cyclobenzaprine (FLEXERIL) 10 MG tablet Take 10 mg by mouth daily as needed for muscle spasms.     Marland Kitchen EPINEPHrine 0.3 mg/0.3 mL IJ SOAJ injection Inject 0.3 mLs into the skin daily as needed (allergic reactions).     . montelukast (SINGULAIR) 10 MG tablet Take 10 mg by mouth at bedtime.    . Multiple Vitamin (MULTIVITAMIN) tablet Take 1 tablet by mouth daily.      Marland Kitchen spironolactone (ALDACTONE) 100 MG tablet Take 200 mg by mouth daily.     . traZODone (DESYREL) 100 MG tablet Take 200 mg by mouth at bedtime.     . tretinoin (RETIN-A) 0.025 % cream Apply 1 application topically daily as needed.    . triamcinolone (NASACORT ALLERGY 24HR) 55 MCG/ACT AERO nasal inhaler Place 1 spray into the nose daily.    . ondansetron (ZOFRAN ODT) 4 MG disintegrating tablet Take 1 tablet (4 mg total) by mouth every 8 (eight) hours as needed for nausea. (Patient not taking: Reported on 06/20/2018) 10 tablet 0  . levofloxacin (LEVAQUIN) 750 MG tablet Take 1  tablet (750 mg total) by mouth daily. (Patient not taking: Reported on 06/20/2018) 7 tablet 0   No facility-administered medications prior to visit.      EXAM:  BP 112/66 (BP Location: Right Arm, Patient Position: Sitting, Cuff Size: Normal)   Pulse 86   Temp 98.8 F (37.1 C) (Oral)   Wt 147 lb 9.6 oz (67 kg)   BMI 24.56 kg/m   Body mass index is 24.56 kg/m.  GENERAL: vitals reviewed and listed above, alert, oriented, appears well hydrated and in no acute distress HEENT: atraumatic, conjunctiva  clear, no obvious abnormalities on inspection of external nose and earsNECK: no obvious masses on inspection palpation  LUNGS: clear to auscultation bilaterally, no wheezes, rales or rhonchi, good air movement CV: HRRR, no clubbing cyanosis or  peripheral edema nl cap refill  Abdomen:  Sof,t normal bowel sounds without hepatosplenomegaly, tender right  Pelvic and suprapubic are tender  no guarding rebound or masses no CVA tenderness  Skin nl   PSYCH: pleasant and cooperative, no obvious depression or anxiety Lab Results  Component Value Date   WBC 8.4 06/05/2018   HGB 14.2 06/05/2018   HCT 43.2 06/05/2018   PLT 225 06/05/2018   GLUCOSE 96 06/09/2018   CHOL 154 07/18/2008   TRIG 105 07/18/2008   HDL 31.7 (L) 07/18/2008   LDLCALC 101 (H) 07/18/2008   ALT 23 06/05/2018   AST 27 06/05/2018   NA 142 06/09/2018   K 3.9 06/09/2018   CL 106 06/09/2018   CREATININE 0.90 06/09/2018   BUN 9 06/09/2018   CO2 31 06/09/2018   TSH 1.51 07/18/2008   BP Readings from Last 3 Encounters:  06/20/18 112/66  06/09/18 (!) 90/58  06/05/18 114/76   UA neg  Ds leuk rbc  ASSESSMENT AND PLAN:  Discussed the following assessment and plan:  Urinary frequency - Plan: POCT urinalysis dipstick, Urine Culture  History of UTI - Plan: Urine Culture At risk  hx of stones  Hx of pyelo    Consider underlying urinary dysfunctions other if  persistent or progressive   Urine culture pending -Patient advised  to return or notify health care team  if  new concerns arise.  Patient Instructions  Urine  screen is   normal   sending for culture  In case  . At this time just avoid bladder irritants.  Contact us   If getting worse   And we will let you know when results are back.   If pain  persistent or progressive we may conisder getting gyne and or urology input.   Standley Brooking. Danh Bayus M.D.

## 2018-06-20 NOTE — Patient Instructions (Signed)
Urine screen is   normal   sending for culture  In case  . At this time just avoid bladder irritants.  Contact us   If getting worse   And we will let you know when results are back.   If pain  persistent or progressive we may conisder getting gyne and or urology input.

## 2018-06-21 LAB — URINE CULTURE
MICRO NUMBER:: 148844
Result:: NO GROWTH
SPECIMEN QUALITY:: ADEQUATE

## 2018-07-05 DIAGNOSIS — M5003 Cervical disc disorder with myelopathy, cervicothoracic region: Secondary | ICD-10-CM | POA: Diagnosis not present

## 2018-07-05 DIAGNOSIS — M542 Cervicalgia: Secondary | ICD-10-CM | POA: Diagnosis not present

## 2018-07-05 DIAGNOSIS — G8929 Other chronic pain: Secondary | ICD-10-CM | POA: Diagnosis not present

## 2018-07-05 DIAGNOSIS — M545 Low back pain: Secondary | ICD-10-CM | POA: Diagnosis not present

## 2018-07-13 ENCOUNTER — Encounter: Payer: Self-pay | Admitting: Internal Medicine

## 2018-08-07 DIAGNOSIS — F902 Attention-deficit hyperactivity disorder, combined type: Secondary | ICD-10-CM | POA: Diagnosis not present

## 2018-08-07 DIAGNOSIS — F419 Anxiety disorder, unspecified: Secondary | ICD-10-CM | POA: Diagnosis not present

## 2018-08-07 DIAGNOSIS — Z79899 Other long term (current) drug therapy: Secondary | ICD-10-CM | POA: Diagnosis not present

## 2018-08-07 DIAGNOSIS — G4709 Other insomnia: Secondary | ICD-10-CM | POA: Diagnosis not present

## 2018-08-16 ENCOUNTER — Telehealth: Payer: Self-pay

## 2018-08-16 NOTE — Telephone Encounter (Signed)
Copied from [place] [id number]. Topic: Appointment Scheduling - Scheduling Inquiry for Clinic >> Aug 16, 2018 12:36 PM Reyne Dumas L wrote: Reason for CRM:   Pt called and left message on [place] 08/15/2018.  Pt states she feels that she has shingles on her forehead going down towards eyes and wants to know if she needs to come in or just get referral straight to eye doctor. Pt can be reached at [phone].

## 2018-08-16 NOTE — Telephone Encounter (Signed)
Pt had televisit with other provider and had already got a prescription sent in

## 2018-11-02 DIAGNOSIS — M545 Low back pain: Secondary | ICD-10-CM | POA: Diagnosis not present

## 2018-11-02 DIAGNOSIS — G8929 Other chronic pain: Secondary | ICD-10-CM | POA: Diagnosis not present

## 2018-11-02 DIAGNOSIS — M542 Cervicalgia: Secondary | ICD-10-CM | POA: Diagnosis not present

## 2018-11-02 DIAGNOSIS — M5003 Cervical disc disorder with myelopathy, cervicothoracic region: Secondary | ICD-10-CM | POA: Diagnosis not present

## 2018-11-07 DIAGNOSIS — G4709 Other insomnia: Secondary | ICD-10-CM | POA: Diagnosis not present

## 2018-11-07 DIAGNOSIS — F419 Anxiety disorder, unspecified: Secondary | ICD-10-CM | POA: Diagnosis not present

## 2018-11-07 DIAGNOSIS — Z79899 Other long term (current) drug therapy: Secondary | ICD-10-CM | POA: Diagnosis not present

## 2018-11-07 DIAGNOSIS — F902 Attention-deficit hyperactivity disorder, combined type: Secondary | ICD-10-CM | POA: Diagnosis not present

## 2018-11-15 DIAGNOSIS — M545 Low back pain: Secondary | ICD-10-CM | POA: Diagnosis not present

## 2018-11-15 DIAGNOSIS — M5003 Cervical disc disorder with myelopathy, cervicothoracic region: Secondary | ICD-10-CM | POA: Diagnosis not present

## 2018-11-15 DIAGNOSIS — G8929 Other chronic pain: Secondary | ICD-10-CM | POA: Diagnosis not present

## 2018-11-15 DIAGNOSIS — M542 Cervicalgia: Secondary | ICD-10-CM | POA: Diagnosis not present

## 2018-11-29 DIAGNOSIS — M5003 Cervical disc disorder with myelopathy, cervicothoracic region: Secondary | ICD-10-CM | POA: Diagnosis not present

## 2018-11-29 DIAGNOSIS — G8929 Other chronic pain: Secondary | ICD-10-CM | POA: Diagnosis not present

## 2018-11-29 DIAGNOSIS — M545 Low back pain: Secondary | ICD-10-CM | POA: Diagnosis not present

## 2018-11-29 DIAGNOSIS — M542 Cervicalgia: Secondary | ICD-10-CM | POA: Diagnosis not present

## 2018-12-26 DIAGNOSIS — Z20828 Contact with and (suspected) exposure to other viral communicable diseases: Secondary | ICD-10-CM | POA: Diagnosis not present

## 2019-01-24 DIAGNOSIS — M542 Cervicalgia: Secondary | ICD-10-CM | POA: Diagnosis not present

## 2019-01-24 DIAGNOSIS — M545 Low back pain: Secondary | ICD-10-CM | POA: Diagnosis not present

## 2019-01-24 DIAGNOSIS — M5003 Cervical disc disorder with myelopathy, cervicothoracic region: Secondary | ICD-10-CM | POA: Diagnosis not present

## 2019-01-24 DIAGNOSIS — G8929 Other chronic pain: Secondary | ICD-10-CM | POA: Diagnosis not present

## 2019-01-25 DIAGNOSIS — J301 Allergic rhinitis due to pollen: Secondary | ICD-10-CM | POA: Diagnosis not present

## 2019-01-25 DIAGNOSIS — J45990 Exercise induced bronchospasm: Secondary | ICD-10-CM | POA: Diagnosis not present

## 2019-01-25 DIAGNOSIS — J3081 Allergic rhinitis due to animal (cat) (dog) hair and dander: Secondary | ICD-10-CM | POA: Diagnosis not present

## 2019-01-25 DIAGNOSIS — J3089 Other allergic rhinitis: Secondary | ICD-10-CM | POA: Diagnosis not present

## 2019-02-02 ENCOUNTER — Telehealth: Payer: Self-pay | Admitting: Internal Medicine

## 2019-02-02 NOTE — Telephone Encounter (Signed)
Pt calling.  States that she continues to have migraines and wants to know if she can get something called in for her without having an office visit. Pt uses  [id number] - [place], [place] - [place] [id number] (Phone) [phone] (Fax)

## 2019-02-02 NOTE — Telephone Encounter (Signed)
Pt has been scheduled for virtual visit on Monday

## 2019-02-02 NOTE — Telephone Encounter (Signed)
A refill?    Needs a visit of some kind  viirtual or other to decide what to give her .

## 2019-02-05 ENCOUNTER — Encounter: Payer: Self-pay | Admitting: Internal Medicine

## 2019-02-05 ENCOUNTER — Other Ambulatory Visit: Payer: Self-pay

## 2019-02-05 ENCOUNTER — Telehealth (INDEPENDENT_AMBULATORY_CARE_PROVIDER_SITE_OTHER): Payer: BC Managed Care – PPO | Admitting: Internal Medicine

## 2019-02-05 DIAGNOSIS — E282 Polycystic ovarian syndrome: Secondary | ICD-10-CM

## 2019-02-05 DIAGNOSIS — Z79899 Other long term (current) drug therapy: Secondary | ICD-10-CM | POA: Diagnosis not present

## 2019-02-05 DIAGNOSIS — G43009 Migraine without aura, not intractable, without status migrainosus: Secondary | ICD-10-CM | POA: Diagnosis not present

## 2019-02-05 MED ORDER — NAPROXEN 250 MG PO TABS: ORAL_TABLET | ORAL | 1 refills | Status: DC

## 2019-02-05 MED ORDER — ONDANSETRON 4 MG PO TBDP: 4.0000 mg | ORAL_TABLET | Freq: Three times a day (TID) | ORAL | 2 refills | Status: DC | PRN

## 2019-02-05 MED ORDER — SUMATRIPTAN SUCCINATE 100 MG PO TABS: ORAL_TABLET | ORAL | 2 refills | Status: DC

## 2019-02-05 NOTE — Progress Notes (Signed)
Virtual Visit via Video Note  I connected with@ on 02/05/19 at 10:30 AM EDT by a video enabled telemedicine application and verified that I am speaking with the correct person using two identifiers. Location patient: home Location provider:work  office Persons participating in the virtual visit: patient, provider  [place] national recommendations  regarding COVID 19 pandemic   video visit is advised over in office visit for this patient.  Patient aware  of the limitations of evaluation and management by telemedicine and  availability of in person appointments. and agreed to proceed.   HPI: Jordan Mayer presents for video visit For worsening frequency migraines headaches.  Onset of migraines about age 48 or 89 which were fairly rare couple times a year.  Over the last year she is noted increasing frequency happening about 3-4 times a month last anywhere from 1 to 3 days associated with nausea phonophobia and photophobia but no vomiting.  She may get some visual changes but no specific aura or weakness. She is just been taking Tylenol which is not that helpful some days the headache is 8 out of 10 and wakes her up. She states that her blood pressure and weight are stable she sees a dermatologist GYN and Dr. Johnnye Sima for her ADD.  She has ongoing problems with sleep for quite a while. She thinks her triggers most likely are related to barometric pressure changes. Her periods are regular because of her PCOS and she is on Spironolactone and trazodone at night    ROS: See pertinent positives and negatives per HPI.  No specific chest pain shortness of breath neurologic signs last night she did not sleep she states has had a full evaluation does take trazodone.  She was on a vacuo but insurance will not pay for that so she is on an extended release methylphenidate.   Past Medical History:  Diagnosis Date  . ADD (attention deficit disorder)   . Allergy   . Asthma   . Chicken pox   .  Depression   . PCOS (polycystic ovarian syndrome)   . Renal disorder    kidney disease  . 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 Shingles     Past Surgical History:  Procedure Laterality Date  . BUNIONECTOMY    . LAPAROSCOPIC CHOLECYSTECTOMY SINGLE SITE WITH INTRAOPERATIVE CHOLANGIOGRAM N/A 06/14/2017   Procedure: LAPAROSCOPIC CHOLECYSTECTOMY SINGLE SITE;  Surgeon: Michael Boston, MD;  Location: [facility];  Service: General;  Laterality: N/A;  . LAPAROSCOPIC GASTRIC SLEEVE RESECTION  09/2013  . LASER ABLATION     Vascular  . TONSILLECTOMY    . WISDOM TOOTH EXTRACTION      Family History  Problem Relation Age of Onset  . Varicose Veins Mother   . Asthma Mother   . Varicose Veins Sister   . Birth defects Maternal Aunt        Cognitively Challenged  . Miscarriages / Stillbirths Maternal Aunt   . Varicose Veins Maternal Aunt   . Cancer Paternal Aunt        Melanoma  . Hearing loss Paternal Aunt   . Varicose Veins Paternal Aunt   . Arthritis Maternal Grandmother   . Arthritis Paternal Grandfather   . Hearing loss Paternal Grandfather     Social History   Tobacco Use  . Smoking status: Never Smoker  . Smokeless tobacco: Never Used  Substance Use Topics  . Alcohol use: Yes    Alcohol/week: 0.0 standard drinks    Comment: occasional  . Drug  use: No      Current Outpatient Medications:  .  Amphetamine Sulfate (EVEKEO) 10 MG TABS, Take 10 mg by mouth daily., Disp: , Rfl:  .  clindamycin (CLEOCIN T) 1 % lotion, Apply 1 application topically once a week., Disp: , Rfl:  .  cyclobenzaprine (FLEXERIL) 10 MG tablet, Take 10 mg by mouth daily as needed for muscle spasms. , Disp: , Rfl:  .  EPINEPHrine 0.3 mg/0.3 mL IJ SOAJ injection, Inject 0.3 mLs into the skin daily as needed (allergic reactions). , Disp: , Rfl:  .  montelukast (SINGULAIR) 10 MG tablet, Take 10 mg by mouth at bedtime., Disp: , Rfl:  .  Multiple Vitamin (MULTIVITAMIN) tablet, Take 1 tablet by mouth daily.  , Disp: , Rfl:  .   ondansetron (ZOFRAN ODT) 4 MG disintegrating tablet, Take 1 tablet (4 mg total) by mouth every 8 (eight) hours as needed for nausea. (Patient not taking: Reported on 06/20/2018), Disp: 10 tablet, Rfl: 0 .  spironolactone (ALDACTONE) 100 MG tablet, Take 200 mg by mouth daily. , Disp: , Rfl:  .  traZODone (DESYREL) 100 MG tablet, Take 200 mg by mouth at bedtime. , Disp: , Rfl:  .  tretinoin (RETIN-A) 0.025 % cream, Apply 1 application topically daily as needed., Disp: , Rfl:  .  triamcinolone (NASACORT ALLERGY 24HR) 55 MCG/ACT AERO nasal inhaler, Place 1 spray into the nose daily., Disp: , Rfl:   EXAM: BP Readings from Last 3 Encounters:  06/20/18 112/66  06/09/18 (!) 90/58  06/05/18 114/76    VITALS per patient if applicable:  GENERAL: alert, oriented, appears well and in no acute distress HEENT: atraumatic, conjunttiva clear, no obvious abnormalities on inspection of external nose and ears  NECK: normal movements of the head and neck  LUNGS: on inspection no signs of respiratory distress, breathing rate appears normal, no obvious gross SOB, gasping or wheezing  CV: no obvious cyanosis  MS: moves all visible extremities without noticeable abnormality  PSYCH/NEURO: pleasant and cooperative, no obvious depression or anxiety, speech and thought processing grossly intact Lab Results  Component Value Date   WBC 8.4 06/05/2018   HGB 14.2 06/05/2018   HCT 43.2 06/05/2018   PLT 225 06/05/2018   GLUCOSE 96 06/09/2018   CHOL 154 07/18/2008   TRIG 105 07/18/2008   HDL 31.7 (L) 07/18/2008   LDLCALC 101 (H) 07/18/2008   ALT 23 06/05/2018   AST 27 06/05/2018   NA 142 06/09/2018   K 3.9 06/09/2018   CL 106 06/09/2018   CREATININE 0.90 06/09/2018   BUN 9 06/09/2018   CO2 31 06/09/2018   TSH 1.51 07/18/2008    ASSESSMENT AND PLAN:  Discussed the following assessment and plan:  No diagnosis found. Most likely migraines flaring over the last year from various triggers discussed.  She  should track her headaches triggers sleep medications over the next 2 months.  Consideration of suppressive therapy. In the meantime we will try naproxen with or without Imitrex triptan for headaches.  Cautioned about rebound.  No significant contraindication to medication. She has no underlying cardiovascular disease of knowledge. She states that she is up-to-date on checking with her specialist and her last chemistry was normal in January. Plan virtual visit or in person she prefers for [place] in 2 months.  She can send [place] a copy of her headache diary or go over it at that time.  Counseled.   Expectant management and discussion of plan and treatment with opportunity to  ask questions and all were answered. The patient agreed with the plan and demonstrated an understanding of the instructions.   Advised to call back or seek an in-person evaluation if worsening  or having  further concerns .   Shanon Ace, MD

## 2019-02-06 DIAGNOSIS — F902 Attention-deficit hyperactivity disorder, combined type: Secondary | ICD-10-CM | POA: Diagnosis not present

## 2019-02-06 DIAGNOSIS — G4709 Other insomnia: Secondary | ICD-10-CM | POA: Diagnosis not present

## 2019-02-06 DIAGNOSIS — F419 Anxiety disorder, unspecified: Secondary | ICD-10-CM | POA: Diagnosis not present

## 2019-02-06 DIAGNOSIS — Z79899 Other long term (current) drug therapy: Secondary | ICD-10-CM | POA: Diagnosis not present

## 2019-02-08 DIAGNOSIS — M542 Cervicalgia: Secondary | ICD-10-CM | POA: Diagnosis not present

## 2019-02-08 DIAGNOSIS — M5003 Cervical disc disorder with myelopathy, cervicothoracic region: Secondary | ICD-10-CM | POA: Diagnosis not present

## 2019-02-08 DIAGNOSIS — M545 Low back pain: Secondary | ICD-10-CM | POA: Diagnosis not present

## 2019-02-08 DIAGNOSIS — G8929 Other chronic pain: Secondary | ICD-10-CM | POA: Diagnosis not present

## 2019-02-19 DIAGNOSIS — L700 Acne vulgaris: Secondary | ICD-10-CM | POA: Diagnosis not present

## 2019-02-19 DIAGNOSIS — D2272 Melanocytic nevi of left lower limb, including hip: Secondary | ICD-10-CM | POA: Diagnosis not present

## 2019-02-19 DIAGNOSIS — D225 Melanocytic nevi of trunk: Secondary | ICD-10-CM | POA: Diagnosis not present

## 2019-03-02 ENCOUNTER — Other Ambulatory Visit: Payer: Self-pay

## 2019-03-02 ENCOUNTER — Ambulatory Visit (INDEPENDENT_AMBULATORY_CARE_PROVIDER_SITE_OTHER): Payer: BC Managed Care – PPO | Admitting: Family

## 2019-03-02 ENCOUNTER — Encounter: Payer: Self-pay | Admitting: Family

## 2019-03-02 ENCOUNTER — Other Ambulatory Visit: Payer: BC Managed Care – PPO

## 2019-03-02 VITALS — BP 108/78 | HR 100 | Temp 98.1°F | Ht 65.0 in | Wt 171.0 lb

## 2019-03-02 DIAGNOSIS — N390 Urinary tract infection, site not specified: Secondary | ICD-10-CM

## 2019-03-02 LAB — POC URINALSYSI DIPSTICK (AUTOMATED)
Bilirubin, UA: NEGATIVE
Glucose, UA: NEGATIVE
Nitrite, UA: NEGATIVE
Protein, UA: POSITIVE — AB
Spec Grav, UA: 1.015 (ref 1.010–1.025)
Urobilinogen, UA: 1 E.U./dL
pH, UA: 7 (ref 5.0–8.0)

## 2019-03-02 MED ORDER — NITROFURANTOIN MONOHYD MACRO 100 MG PO CAPS: 100.0000 mg | ORAL_CAPSULE | Freq: Two times a day (BID) | ORAL | 0 refills | Status: DC

## 2019-03-02 NOTE — Progress Notes (Signed)
Jordan Mayer is a 48 y.o. female with the following history as recorded in [employer]:  Patient Active Problem List   Diagnosis Date Noted  . Varicose veins of leg with complications [id number]  . Varicose veins of lower extremities with other complications 0000000  . Vertigo 12/23/2011  . Otalgia of both ears 09/17/2010  . SLEEP DISORDER/DISTURBANCE 09/27/2008  . FATIGUE 07/15/2008  . ADHD 12/05/2007  . HEADACHE 11/26/2007  . ALLERGIC RHINITIS 10/13/2007  . NEPHROLITHIASIS, HX OF 10/13/2007  . NONSPECIFIC MESENTERIC LYMPHADENITIS 03/17/2007  . RENAL CALCULUS, LEFT 03/17/2007  . DEPRESSION 03/07/2007  . DYSPNEA ON EXERTION 03/07/2007    Current Outpatient Medications  Medication Sig Dispense Refill  . clindamycin (CLEOCIN T) 1 % lotion Apply 1 application topically once a week.    . cyclobenzaprine (FLEXERIL) 10 MG tablet Take 10 mg by mouth daily as needed for muscle spasms.     Marland Kitchen EPINEPHrine 0.3 mg/0.3 mL IJ SOAJ injection Inject 0.3 mLs into the skin daily as needed (allergic reactions).     Marland Kitchen L-Methylfolate-Algae (DEPLIN 15 PO) Take 1 tablet by mouth.    . Multiple Vitamin (MULTIVITAMIN) tablet Take 1 tablet by mouth daily.      . naproxen (NAPROSYN) 250 MG tablet Take 2 po at onset of headache and then 1 every 8 hours as needed 40 tablet 1  . ondansetron (ZOFRAN ODT) 4 MG disintegrating tablet Take 1 tablet (4 mg total) by mouth every 8 (eight) hours as needed for nausea. 15 tablet 2  . spironolactone (ALDACTONE) 100 MG tablet Take 200 mg by mouth daily.     . SUMAtriptan (IMITREX) 100 MG tablet Take 1 po as needed  for migraine and can repeat in 2 hours . 9 tablet 2  . traZODone (DESYREL) 100 MG tablet Take 200 mg by mouth at bedtime.     . tretinoin (RETIN-A) 0.025 % cream Apply 1 application topically daily as needed.    . triamcinolone (NASACORT ALLERGY 24HR) 55 MCG/ACT AERO nasal inhaler Place 1 spray into the nose daily.    . Amphetamine Sulfate (EVEKEO) 10 MG TABS  Take 10 mg by mouth daily.    . nitrofurantoin, macrocrystal-monohydrate, (MACROBID) 100 MG capsule Take 1 capsule (100 mg total) by mouth 2 (two) times daily. 14 capsule 0   No current facility-administered medications for this visit.     Allergies: Contrast media [iodinated diagnostic agents], Iohexol, Penicillins, and Gadolinium derivatives  Past Medical History:  Diagnosis Date  . ADD (attention deficit disorder)   . Allergy   . Asthma   . Chicken pox   . Depression   . PCOS (polycystic ovarian syndrome)   . Renal disorder    kidney disease  . 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 Shingles     Past Surgical History:  Procedure Laterality Date  . BUNIONECTOMY    . LAPAROSCOPIC CHOLECYSTECTOMY SINGLE SITE WITH INTRAOPERATIVE CHOLANGIOGRAM N/A 06/14/2017   Procedure: LAPAROSCOPIC CHOLECYSTECTOMY SINGLE SITE;  Surgeon: Michael Boston, MD;  Location: [facility];  Service: General;  Laterality: N/A;  . LAPAROSCOPIC GASTRIC SLEEVE RESECTION  09/2013  . LASER ABLATION     Vascular  . TONSILLECTOMY    . WISDOM TOOTH EXTRACTION      Family History  Problem Relation Age of Onset  . Varicose Veins Mother   . Asthma Mother   . Varicose Veins Sister   . Birth defects Maternal Aunt        Cognitively Challenged  . Miscarriages /  Stillbirths Maternal Aunt   . Varicose Veins Maternal Aunt   . Cancer Paternal Aunt        Melanoma  . Hearing loss Paternal Aunt   . Varicose Veins Paternal Aunt   . Arthritis Maternal Grandmother   . Arthritis Paternal Grandfather   . Hearing loss Paternal Grandfather     Social History   Tobacco Use  . Smoking status: Never Smoker  . Smokeless tobacco: Never Used  Substance Use Topics  . Alcohol use: Yes    Alcohol/week: 0.0 standard drinks    Comment: occasional    Subjective:  1 day history of UTI; + burning, urgency; no blood, fever or back pain; was in ER with kidney infection in January 2019;   Objective:  Vitals:   03/02/19 1323  BP: 108/78   Pulse: 100  Temp: 98.1 F (36.7 C)  TempSrc: Oral  SpO2: 97%  Weight: 171 lb (77.6 kg)  Height: 5\' 5"  (1.651 m)    General: Well developed, well nourished, in no acute distress  Skin : Warm and dry.  Head: Normocephalic and atraumatic  Lungs: Respirations unlabored; clear to auscultation bilaterally without wheeze, rales, rhonchi  CVS exam: normal rate and regular rhythm.  Musculoskeletal: No deformities; no active joint inflammation; negative CVA tenderness Neurologic: Alert and oriented; speech intact; face symmetrical; moves all extremities well; CNII-XII intact without focal deficit   Assessment:  1. Urinary tract infection without hematuria, site unspecified     Plan:  Check U/A and urine culture; Rx for Macrobid 100 mg bid x 7 days; increase fluids, rest and follow up worse, no better.   No follow-ups on file.  Orders Placed This Encounter  Procedures  . Urine Culture    Standing Status:   Future    Number of Occurrences:   1    Standing Expiration Date:   03/01/2020  . POCT Urinalysis Dipstick (Automated)    Requested Prescriptions   Signed Prescriptions Disp Refills  . nitrofurantoin, macrocrystal-monohydrate, (MACROBID) 100 MG capsule 14 capsule 0    Sig: Take 1 capsule (100 mg total) by mouth 2 (two) times daily.

## 2019-03-04 LAB — URINE CULTURE
MICRO NUMBER:: 998466
SPECIMEN QUALITY:: ADEQUATE

## 2019-03-07 DIAGNOSIS — G8929 Other chronic pain: Secondary | ICD-10-CM | POA: Diagnosis not present

## 2019-03-07 DIAGNOSIS — M545 Low back pain: Secondary | ICD-10-CM | POA: Diagnosis not present

## 2019-03-07 DIAGNOSIS — M542 Cervicalgia: Secondary | ICD-10-CM | POA: Diagnosis not present

## 2019-03-07 DIAGNOSIS — M5003 Cervical disc disorder with myelopathy, cervicothoracic region: Secondary | ICD-10-CM | POA: Diagnosis not present

## 2019-03-16 ENCOUNTER — Telehealth: Payer: Self-pay

## 2019-03-16 NOTE — Telephone Encounter (Signed)
Pt states that she has not had pain so far today. Pt states that she has pain every now and then. Pt describes the pain as sharp when it happens. Pt states that she wil go to urgent care this weekend if pain comes back or if tolerable will call us on Monday pt states she also has a history of kidney stones and believes it can be that

## 2019-03-16 NOTE — Telephone Encounter (Signed)
Copied from [place] [id number]. Topic: General - Inquiry >> Mar 15, 2019  3:53 PM Selinda Flavin B, NT wrote: Reason for CRM: Patient calling and states that she was recently seen for a UTI and was given medication. States that she has taken the medication and is now having right side pain. Would like to know if this is normal? Please advise.  Tried to call pt to find out more info lvm for pt to call back. crm created

## 2019-03-16 NOTE — Telephone Encounter (Signed)
Do not have enough information  She was evaluated and treated bu another provider for e coli UTI   Depending on  On acutiy of  Problem  May advise and in person

## 2019-03-16 NOTE — Telephone Encounter (Signed)
Pt states she will call back Monday if any issues

## 2019-03-16 NOTE — Telephone Encounter (Signed)
Agreed  But  She may still benefit from a follow up   With urine and cultlure since she has a hx of  Kidney infection also

## 2019-03-28 DIAGNOSIS — M545 Low back pain: Secondary | ICD-10-CM | POA: Diagnosis not present

## 2019-03-28 DIAGNOSIS — M542 Cervicalgia: Secondary | ICD-10-CM | POA: Diagnosis not present

## 2019-03-28 DIAGNOSIS — G8929 Other chronic pain: Secondary | ICD-10-CM | POA: Diagnosis not present

## 2019-03-28 DIAGNOSIS — M5003 Cervical disc disorder with myelopathy, cervicothoracic region: Secondary | ICD-10-CM | POA: Diagnosis not present

## 2019-04-01 ENCOUNTER — Other Ambulatory Visit: Payer: Self-pay | Admitting: Internal Medicine

## 2019-04-05 ENCOUNTER — Other Ambulatory Visit: Payer: Self-pay

## 2019-04-05 DIAGNOSIS — Z20822 Contact with and (suspected) exposure to covid-19: Secondary | ICD-10-CM

## 2019-04-07 LAB — NOVEL CORONAVIRUS, NAA: SARS-CoV-2, NAA: NOT DETECTED

## 2019-05-03 ENCOUNTER — Telehealth: Payer: Self-pay | Admitting: *Deleted

## 2019-05-03 DIAGNOSIS — F411 Generalized anxiety disorder: Secondary | ICD-10-CM | POA: Diagnosis not present

## 2019-05-03 NOTE — Telephone Encounter (Signed)
Copied from [place] [phone]. Topic: Appointment Scheduling - Scheduling Inquiry for Clinic >> May 03, 2019  2:40 PM Rayann Heman wrote: Reason for CRM: pt would like a call back to schedule a virtual visit with Dr Regis Bill regarding migraines. Please advise

## 2019-05-04 NOTE — Telephone Encounter (Signed)
Pt has been called

## 2019-05-07 NOTE — Progress Notes (Signed)
Virtual Visit via Video Note  I connected with@ on 12 22 29  at  3:00 PM EST by a video enabled telemedicine application and verified that I am speaking with the correct person using two identifiers. Location patient: home Location provider:home   office Persons participating in the virtual visit: patient, provider sound erratic  In call   [place] national recommendations  regarding COVID 19 pandemic   video visit is advised over in office visit for this patient.  Patient aware  of the limitations of evaluation and management by telemedicine and  availability of in person appointments. and agreed to proceed.   HPI: Jordan Mayer presents for video visit for fu migraines .  monitoring at least 4 per month and triggers barometric pressure changes . naprosyn some help but  The sumatriptan feels augments sx ,. Ha last 1+ day  No new neuro sx.  Sometimes  Awakens with  HA   Full blown . Interested in  Set designer.   No change in other meds  Had labs done last Jan    ROS: See pertinent positives and negatives per HPI. Sister has chronic migraines  Past Medical History:  Diagnosis Date  . ADD (attention deficit disorder)   . Allergy   . Asthma   . Chicken pox   . Depression   . PCOS (polycystic ovarian syndrome)   . Renal disorder    kidney disease  . 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 Shingles     Past Surgical History:  Procedure Laterality Date  . BUNIONECTOMY    . LAPAROSCOPIC CHOLECYSTECTOMY SINGLE SITE WITH INTRAOPERATIVE CHOLANGIOGRAM N/A 06/14/2017   Procedure: LAPAROSCOPIC CHOLECYSTECTOMY SINGLE SITE;  Surgeon: Michael Boston, MD;  Location: [facility];  Service: General;  Laterality: N/A;  . LAPAROSCOPIC GASTRIC SLEEVE RESECTION  09/2013  . LASER ABLATION     Vascular  . TONSILLECTOMY    . WISDOM TOOTH EXTRACTION      Family History  Problem Relation Age of Onset  . Varicose Veins Mother   . Asthma Mother   . Varicose Veins Sister   . Birth defects Maternal Aunt     Cognitively Challenged  . Miscarriages / Stillbirths Maternal Aunt   . Varicose Veins Maternal Aunt   . Cancer Paternal Aunt        Melanoma  . Hearing loss Paternal Aunt   . Varicose Veins Paternal Aunt   . Arthritis Maternal Grandmother   . Arthritis Paternal Grandfather   . Hearing loss Paternal Grandfather      Current Outpatient Medications:  .  Amphetamine Sulfate (EVEKEO) 10 MG TABS, Take 10 mg by mouth daily., Disp: , Rfl:  .  clindamycin (CLEOCIN T) 1 % lotion, Apply 1 application topically once a week., Disp: , Rfl:  .  cyclobenzaprine (FLEXERIL) 10 MG tablet, Take 10 mg by mouth daily as needed for muscle spasms. , Disp: , Rfl:  .  EPINEPHrine 0.3 mg/0.3 mL IJ SOAJ injection, Inject 0.3 mLs into the skin daily as needed (allergic reactions). , Disp: , Rfl:  .  L-Methylfolate-Algae (DEPLIN 15 PO), Take 1 tablet by mouth., Disp: , Rfl:  .  Multiple Vitamin (MULTIVITAMIN) tablet, Take 1 tablet by mouth daily.  , Disp: , Rfl:  .  naproxen (NAPROSYN) 250 MG tablet, TAKE 2 TABS BY MOUTH AT ONSET OF HEADACHE AND THEN 1 EVERY 8 HOURS AS NEEDED, Disp: 40 tablet, Rfl: 1 .  ondansetron (ZOFRAN ODT) 4 MG disintegrating tablet, Take 1 tablet (  4 mg total) by mouth every 8 (eight) hours as needed for nausea., Disp: 15 tablet, Rfl: 2 .  rizatriptan (MAXALT-MLT) 10 MG disintegrating tablet, Take 1 tablet (10 mg total) by mouth as needed for migraine. May repeat in 2 hours if needed, Disp: 10 tablet, Rfl: 1 .  spironolactone (ALDACTONE) 100 MG tablet, Take 200 mg by mouth daily. , Disp: , Rfl:  .  topiramate (TOPAMAX) 25 MG tablet, 25 mg orally hs x  7 days , 25 mg twice daily for second week, 25 mg in the morning and 50 mg in the evening for third week, then 50 mg twice daily as tolerated ., Disp: 90 tablet, Rfl: 1 .  traZODone (DESYREL) 100 MG tablet, Take 200 mg by mouth at bedtime. , Disp: , Rfl:  .  tretinoin (RETIN-A) 0.025 % cream, Apply 1 application topically daily as needed., Disp: ,  Rfl:  .  triamcinolone (NASACORT ALLERGY 24HR) 55 MCG/ACT AERO nasal inhaler, Place 1 spray into the nose daily., Disp: , Rfl:   EXAM: BP Readings from Last 3 Encounters:  03/02/19 108/78  06/20/18 112/66  06/09/18 (!) 90/58   Wt Readings from Last 3 Encounters:  03/02/19 171 lb (77.6 kg)  06/20/18 147 lb 9.6 oz (67 kg)  06/09/18 151 lb 4.8 oz (68.6 kg)     VITALS per patient if applicable: looks well and nl speech and affect   GENERAL: alert, oriented, appears well and in no acute distress  HEENT: atraumatic, conjunttiva clear, no obvious abnormalities on inspection of external nose and ears  NECK: normal movements of the head and neck  LUNGS: on inspection no signs of respiratory distress, breathing rate appears normal, no obvious gross SOB, gasping or wheezing  CV: no obvious cyanosis  MS: moves all visible extremities without noticeable abnormality  PSYCH/NEURO: pleasant and cooperative, no obvious depression or anxiety, speech and thought processing grossly intact Lab Results  Component Value Date   WBC 8.4 06/05/2018   HGB 14.2 06/05/2018   HCT 43.2 06/05/2018   PLT 225 06/05/2018   GLUCOSE 96 06/09/2018   CHOL 154 07/18/2008   TRIG 105 07/18/2008   HDL 31.7 (L) 07/18/2008   LDLCALC 101 (H) 07/18/2008   ALT 23 06/05/2018   AST 27 06/05/2018   NA 142 06/09/2018   K 3.9 06/09/2018   CL 106 06/09/2018   CREATININE 0.90 06/09/2018   BUN 9 06/09/2018   CO2 31 06/09/2018   TSH 1.51 07/18/2008    ASSESSMENT AND PLAN:  Discussed the following assessment and plan:    ICD-10-CM   1. Migraine without aura and without status migrainosus, not intractable  G43.009   2. Medication management  Z79.899   3. History of sleeve gastric surgery  Z90.3     Counseled.   Suppressive therapy disc  Will try topamax  Renal function appears ok at  last check  Ramp up 25 to 50 bid  As tolerated and then rov in 2 months   Try maxalt instead  of sumatriptan trial  and refill  zofran.   Expectant management and discussion of plan and treatment with opportunity to ask questions and all were answered. The patient agreed with the plan and demonstrated an understanding of the instructions.   Advised to call back or seek an in-person evaluation if worsening  or having  further concerns . Return in about 2 months (around 07/09/2019), or and prn.  Shanon Ace, MD

## 2019-05-08 ENCOUNTER — Other Ambulatory Visit: Payer: Self-pay

## 2019-05-08 ENCOUNTER — Telehealth (INDEPENDENT_AMBULATORY_CARE_PROVIDER_SITE_OTHER): Payer: BC Managed Care – PPO | Admitting: Internal Medicine

## 2019-05-08 ENCOUNTER — Encounter: Payer: Self-pay | Admitting: Internal Medicine

## 2019-05-08 DIAGNOSIS — Z79899 Other long term (current) drug therapy: Secondary | ICD-10-CM | POA: Diagnosis not present

## 2019-05-08 DIAGNOSIS — Z903 Acquired absence of stomach [part of]: Secondary | ICD-10-CM

## 2019-05-08 DIAGNOSIS — G43009 Migraine without aura, not intractable, without status migrainosus: Secondary | ICD-10-CM

## 2019-05-08 MED ORDER — RIZATRIPTAN BENZOATE 10 MG PO TBDP: 10.0000 mg | ORAL_TABLET | ORAL | 1 refills | Status: DC | PRN

## 2019-05-08 MED ORDER — ONDANSETRON 4 MG PO TBDP: 4.0000 mg | ORAL_TABLET | Freq: Three times a day (TID) | ORAL | 2 refills | Status: DC | PRN

## 2019-05-08 MED ORDER — TOPIRAMATE 25 MG PO TABS: ORAL_TABLET | ORAL | 1 refills | Status: DC

## 2019-05-29 DIAGNOSIS — Z03818 Encounter for observation for suspected exposure to other biological agents ruled out: Secondary | ICD-10-CM | POA: Diagnosis not present

## 2019-05-29 DIAGNOSIS — Z20828 Contact with and (suspected) exposure to other viral communicable diseases: Secondary | ICD-10-CM | POA: Diagnosis not present

## 2019-05-30 ENCOUNTER — Other Ambulatory Visit: Payer: Self-pay | Admitting: Internal Medicine

## 2019-06-07 ENCOUNTER — Telehealth: Payer: Self-pay | Admitting: Internal Medicine

## 2019-06-07 NOTE — Telephone Encounter (Signed)
Patient called in wanted to let Dr. Regis Bill know that the Migraine medication topiramate (TOPAMAX) 25 MG tablet it was very affective for the patient. Patient stated she did have to quit taking since it was making something else act up patient didn't say what and just wanted the message sent. Please advise.

## 2019-06-08 ENCOUNTER — Telehealth: Payer: Self-pay | Admitting: Internal Medicine

## 2019-06-08 NOTE — Telephone Encounter (Signed)
So  Can take twice a day ( lower  and then plan a follow up visit

## 2019-06-08 NOTE — Telephone Encounter (Signed)
Patient was returning Madison's call.

## 2019-06-08 NOTE — Telephone Encounter (Signed)
Lvm for pt to call back to discuss  

## 2019-06-08 NOTE — Telephone Encounter (Signed)
Please advise 

## 2019-06-08 NOTE — Telephone Encounter (Signed)
Pt has been told information and follow up has been made

## 2019-06-08 NOTE — Telephone Encounter (Signed)
contact patient and find out  Status of her headaches and  Medications   Do we need another visit  Now or  Wait until February?

## 2019-06-08 NOTE — Telephone Encounter (Signed)
Pt states that headaches instantly came back she states that as increasing dose to 3 times daily is when muscle cramps started . Pt states when she was taking it twice daily that there was no issues or headaches

## 2019-06-14 ENCOUNTER — Other Ambulatory Visit: Payer: Self-pay | Admitting: Internal Medicine

## 2019-06-14 DIAGNOSIS — F902 Attention-deficit hyperactivity disorder, combined type: Secondary | ICD-10-CM | POA: Diagnosis not present

## 2019-06-14 DIAGNOSIS — F419 Anxiety disorder, unspecified: Secondary | ICD-10-CM | POA: Diagnosis not present

## 2019-06-14 DIAGNOSIS — Z79899 Other long term (current) drug therapy: Secondary | ICD-10-CM | POA: Diagnosis not present

## 2019-06-18 DIAGNOSIS — Z01419 Encounter for gynecological examination (general) (routine) without abnormal findings: Secondary | ICD-10-CM | POA: Diagnosis not present

## 2019-06-18 DIAGNOSIS — Z6827 Body mass index (BMI) 27.0-27.9, adult: Secondary | ICD-10-CM | POA: Diagnosis not present

## 2019-06-18 DIAGNOSIS — Z0000 Encounter for general adult medical examination without abnormal findings: Secondary | ICD-10-CM | POA: Diagnosis not present

## 2019-06-18 DIAGNOSIS — N926 Irregular menstruation, unspecified: Secondary | ICD-10-CM | POA: Diagnosis not present

## 2019-06-18 LAB — COMPREHENSIVE METABOLIC PANEL
Albumin: 4.4 (ref 3.5–5.0)
Calcium: 9.3 (ref 8.7–10.7)
GFR calc Af Amer: 72
GFR calc non Af Amer: 62
Globulin: 2.2

## 2019-06-18 LAB — HEPATIC FUNCTION PANEL
ALT: 18 (ref 7–35)
AST: 14 (ref 13–35)
Alkaline Phosphatase: 88 (ref 25–125)
Bilirubin, Total: 0.4

## 2019-06-18 LAB — CBC AND DIFFERENTIAL
HCT: 40 (ref 36–46)
Hemoglobin: 13.3 (ref 12.0–16.0)
Neutrophils Absolute: 60
Platelets: 223 (ref 150–399)
WBC: 6.5

## 2019-06-18 LAB — BASIC METABOLIC PANEL
BUN: 13 (ref 4–21)
CO2: 23 — AB (ref 13–22)
Chloride: 106 (ref 99–108)
Creatinine: 1.1 (ref 0.5–1.1)
Glucose: 95
Potassium: 3.7 (ref 3.4–5.3)
Sodium: 139 (ref 137–147)

## 2019-06-18 LAB — LIPID PANEL
Cholesterol: 164 (ref 0–200)
HDL: 51 (ref 35–70)
LDL Cholesterol: 91
Triglycerides: 123 (ref 40–160)

## 2019-06-18 LAB — CBC: RBC: 4.55 (ref 3.87–5.11)

## 2019-06-18 LAB — TSH: TSH: 2.59 (ref 0.41–5.90)

## 2019-06-21 ENCOUNTER — Other Ambulatory Visit: Payer: Self-pay

## 2019-06-21 ENCOUNTER — Encounter: Payer: Self-pay | Admitting: Internal Medicine

## 2019-06-21 ENCOUNTER — Telehealth (INDEPENDENT_AMBULATORY_CARE_PROVIDER_SITE_OTHER): Payer: BC Managed Care – PPO | Admitting: Internal Medicine

## 2019-06-21 DIAGNOSIS — Z79899 Other long term (current) drug therapy: Secondary | ICD-10-CM

## 2019-06-21 DIAGNOSIS — T887XXA Unspecified adverse effect of drug or medicament, initial encounter: Secondary | ICD-10-CM

## 2019-06-21 DIAGNOSIS — G43009 Migraine without aura, not intractable, without status migrainosus: Secondary | ICD-10-CM

## 2019-06-21 MED ORDER — TOPIRAMATE 25 MG PO TABS: 50.0000 mg | ORAL_TABLET | Freq: Every day | ORAL | 1 refills | Status: DC

## 2019-06-21 NOTE — Progress Notes (Signed)
Virtual Visit via Video Note  I connected with@ on 06/21/19 at 10:30 AM EST by a video enabled telemedicine application and verified that I am speaking with the correct person using two identifiers. Location patient: home Location provider: home office Persons participating in the virtual visit: patient, provider  [place] national recommendations  regarding COVID 19 pandemic   video visit is advised over in office visit for this patient.  Patient aware  of the limitations of evaluation and management by telemedicine and  availability of in person appointments. and agreed to proceed.   HPI: Jordan Mayer presents for video visit  fo fu headaches and meds   Has had success with dec headaches since on topamax but when taking bid noted unusual tingling in fingers  Hands and felt from the medication so just taking  At night  .  Good effect of med on once daily dosing so far .  Also Dr Elesa Massed gyne has done lab recently and potassium is normal   ROS: See pertinent positives and negatives per HPI.  Past Medical History:  Diagnosis Date  . ADD (attention deficit disorder)   . Allergy   . Asthma   . Chicken pox   . Depression   . PCOS (polycystic ovarian syndrome)   . Renal disorder    kidney disease  . 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 Shingles     Past Surgical History:  Procedure Laterality Date  . BUNIONECTOMY    . LAPAROSCOPIC CHOLECYSTECTOMY SINGLE SITE WITH INTRAOPERATIVE CHOLANGIOGRAM N/A 06/14/2017   Procedure: LAPAROSCOPIC CHOLECYSTECTOMY SINGLE SITE;  Surgeon: Michael Boston, MD;  Location: [facility];  Service: General;  Laterality: N/A;  . LAPAROSCOPIC GASTRIC SLEEVE RESECTION  09/2013  . LASER ABLATION     Vascular  . TONSILLECTOMY    . WISDOM TOOTH EXTRACTION      Family History  Problem Relation Age of Onset  . Varicose Veins Mother   . Asthma Mother   . Varicose Veins Sister   . Birth defects Maternal Aunt        Cognitively Challenged  . Miscarriages / Stillbirths  Maternal Aunt   . Varicose Veins Maternal Aunt   . Cancer Paternal Aunt        Melanoma  . Hearing loss Paternal Aunt   . Varicose Veins Paternal Aunt   . Arthritis Maternal Grandmother   . Arthritis Paternal Grandfather   . Hearing loss Paternal Grandfather     Social History   Tobacco Use  . Smoking status: Never Smoker  . Smokeless tobacco: Never Used  Substance Use Topics  . Alcohol use: Yes    Alcohol/week: 0.0 standard drinks    Comment: occasional  . Drug use: No      Current Outpatient Medications:  .  Amphetamine Sulfate (EVEKEO) 10 MG TABS, Take 10 mg by mouth daily., Disp: , Rfl:  .  clindamycin (CLEOCIN T) 1 % lotion, Apply 1 application topically once a week., Disp: , Rfl:  .  cyclobenzaprine (FLEXERIL) 10 MG tablet, Take 10 mg by mouth daily as needed for muscle spasms. , Disp: , Rfl:  .  EPINEPHrine 0.3 mg/0.3 mL IJ SOAJ injection, Inject 0.3 mLs into the skin daily as needed (allergic reactions). , Disp: , Rfl:  .  L-Methylfolate-Algae (DEPLIN 15 PO), Take 1 tablet by mouth., Disp: , Rfl:  .  Multiple Vitamin (MULTIVITAMIN) tablet, Take 1 tablet by mouth daily.  , Disp: , Rfl:  .  naproxen (NAPROSYN) 250 MG  tablet, TAKE 2 TABS BY MOUTH AT ONSET OF HEADACHE AND THEN 1 EVERY 8 HOURS AS NEEDED, Disp: 40 tablet, Rfl: 1 .  ondansetron (ZOFRAN ODT) 4 MG disintegrating tablet, Take 1 tablet (4 mg total) by mouth every 8 (eight) hours as needed for nausea., Disp: 15 tablet, Rfl: 2 .  rizatriptan (MAXALT-MLT) 10 MG disintegrating tablet, Take 1 tablet (10 mg total) by mouth as needed for migraine. May repeat in 2 hours if needed, Disp: 10 tablet, Rfl: 1 .  spironolactone (ALDACTONE) 100 MG tablet, Take 200 mg by mouth daily. , Disp: , Rfl:  .  topiramate (TOPAMAX) 25 MG tablet, TAKE 1 TABLET AT BEDTIME FOR 7 DAYS , 1 TABLET TWICE DAILY FOR SECOND WEEK, 1 TABLET IN THE MORNING AND 2 TABLETS IN THE EVENING FOR THIRD WEEK, THEN 2 TABLETS TWICE DAILY AS TOLERATED ., Disp: 270  tablet, Rfl: 1 .  traZODone (DESYREL) 100 MG tablet, Take 200 mg by mouth at bedtime. , Disp: , Rfl:  .  tretinoin (RETIN-A) 0.025 % cream, Apply 1 application topically daily as needed., Disp: , Rfl:  .  triamcinolone (NASACORT ALLERGY 24HR) 55 MCG/ACT AERO nasal inhaler, Place 1 spray into the nose daily., Disp: , Rfl:   EXAM: BP Readings from Last 3 Encounters:  03/02/19 108/78  06/20/18 112/66  06/09/18 (!) 90/58    VITALS per patient if applicable:  GENERAL: alert, oriented, appears well and in no acute distress  HEENT: atraumatic, conjunttiva clear, no obvious abnormalities on inspection of external nose and ears  NECK: normal movements of the head and neck  LUNGS: on inspection no signs of respiratory distress, breathing rate appears normal, no obvious gross SOB, gasping or wheezing  CV: no obvious cyanosis  MS: moves all visible extremities without noticeable abnormality  PSYCH/NEURO: pleasant and cooperative, no obvious depression or anxiety, speech and thought processing grossly intact Lab Results  Component Value Date   WBC 8.4 06/05/2018   HGB 14.2 06/05/2018   HCT 43.2 06/05/2018   PLT 225 06/05/2018   GLUCOSE 96 06/09/2018   CHOL 154 07/18/2008   TRIG 105 07/18/2008   HDL 31.7 (L) 07/18/2008   LDLCALC 101 (H) 07/18/2008   ALT 23 06/05/2018   AST 27 06/05/2018   NA 142 06/09/2018   K 3.9 06/09/2018   CL 106 06/09/2018   CREATININE 0.90 06/09/2018   BUN 9 06/09/2018   CO2 31 06/09/2018   TSH 1.51 07/18/2008    ASSESSMENT AND PLAN:  Discussed the following assessment and plan:    ICD-10-CM   1. Migraine without aura and without status migrainosus, not intractable  G43.009   2. Medication management  Z79.899   3. Medication side effect  T88.7XXA    tingling with bid dosing topiramate stay at once a day dosing   Improved  MHA control  On  topiramate   But had se of  Bid dose  Ok to take hs dose   50 mg at this time  But can try 25  If controls  She  iswith  Improvement in her migraine frequency severity  Disc new guidelines for Colon cancer screening  Average risk  Can call for colon referral or cologuard or other orders when  Wishes  Counseled.   Expectant management and discussion of plan and treatment with opportunity to ask questions and all were answered. The patient agreed with the plan and demonstrated an understanding of the instructions.   Advised to call back or seek  an in-person evaluation if worsening  or having  further concerns . Return in about 6 months (around 12/19/2019) for medication check or cpx .    Shanon Ace, MD

## 2019-07-13 DIAGNOSIS — L700 Acne vulgaris: Secondary | ICD-10-CM | POA: Diagnosis not present

## 2019-07-13 DIAGNOSIS — L218 Other seborrheic dermatitis: Secondary | ICD-10-CM | POA: Diagnosis not present

## 2019-07-13 DIAGNOSIS — L308 Other specified dermatitis: Secondary | ICD-10-CM | POA: Diagnosis not present

## 2019-07-13 DIAGNOSIS — L738 Other specified follicular disorders: Secondary | ICD-10-CM | POA: Diagnosis not present

## 2019-07-25 DIAGNOSIS — M545 Low back pain: Secondary | ICD-10-CM | POA: Diagnosis not present

## 2019-07-25 DIAGNOSIS — M5003 Cervical disc disorder with myelopathy, cervicothoracic region: Secondary | ICD-10-CM | POA: Diagnosis not present

## 2019-07-25 DIAGNOSIS — M542 Cervicalgia: Secondary | ICD-10-CM | POA: Diagnosis not present

## 2019-07-25 DIAGNOSIS — G8929 Other chronic pain: Secondary | ICD-10-CM | POA: Diagnosis not present

## 2019-07-27 ENCOUNTER — Encounter: Payer: Self-pay | Admitting: Internal Medicine

## 2019-07-30 DIAGNOSIS — Z1231 Encounter for screening mammogram for malignant neoplasm of breast: Secondary | ICD-10-CM | POA: Diagnosis not present

## 2019-07-31 LAB — HM MAMMOGRAPHY

## 2019-08-06 DIAGNOSIS — M5003 Cervical disc disorder with myelopathy, cervicothoracic region: Secondary | ICD-10-CM | POA: Diagnosis not present

## 2019-08-06 DIAGNOSIS — G8929 Other chronic pain: Secondary | ICD-10-CM | POA: Diagnosis not present

## 2019-08-06 DIAGNOSIS — M545 Low back pain: Secondary | ICD-10-CM | POA: Diagnosis not present

## 2019-08-06 DIAGNOSIS — M542 Cervicalgia: Secondary | ICD-10-CM | POA: Diagnosis not present

## 2019-08-08 ENCOUNTER — Telehealth (INDEPENDENT_AMBULATORY_CARE_PROVIDER_SITE_OTHER): Payer: BC Managed Care – PPO | Admitting: Family Medicine

## 2019-08-08 ENCOUNTER — Other Ambulatory Visit: Payer: Self-pay

## 2019-08-08 DIAGNOSIS — Z20822 Contact with and (suspected) exposure to covid-19: Secondary | ICD-10-CM | POA: Diagnosis not present

## 2019-08-08 DIAGNOSIS — R197 Diarrhea, unspecified: Secondary | ICD-10-CM

## 2019-08-08 DIAGNOSIS — R300 Dysuria: Secondary | ICD-10-CM | POA: Diagnosis not present

## 2019-08-08 MED ORDER — CIPROFLOXACIN HCL 500 MG PO TABS: 500.0000 mg | ORAL_TABLET | Freq: Two times a day (BID) | ORAL | 0 refills | Status: DC

## 2019-08-08 NOTE — Progress Notes (Signed)
This visit type was conducted due to national recommendations for restrictions regarding the COVID-19 pandemic in an effort to limit this patient's exposure and mitigate transmission in our community.   Virtual Visit via Telephone Note  I connected with Jordan Mayer on 08/08/19 at  3:30 PM EDT by telephone and verified that I am speaking with the correct person using two identifiers.   I discussed the limitations, risks, security and privacy concerns of performing an evaluation and management service by telephone and the availability of in person appointments. I also discussed with the patient that there may be a patient responsible charge related to this service. The patient expressed understanding and agreed to proceed.  Location patient: home Location provider: work or home office Participants present for the call: patient, provider Patient did not have a visit in the prior 7 days to address this/these issue(s).   History of Present Illness: Jordan Mayer called for the following concerns.  She states she has had some increased "odor" from her urine for the past several days.  She has some mild burning with urination couple days ago but none currently.  She states her urine might be slightly darker in color than usual but tends to clear up during the day with increased fluids.  No gross hematuria.  No fever.  No chills.  No flank pain.  She also relates 5-day history of watery small-volume diarrhea stools.  No associated nausea or vomiting.  She has had some occasional mid abdominal cramp-like pains.  No recent antibiotics.  No recent travels.  She has had prior gastric bypass surgery several years ago and previous cholecystectomy few years ago.  Does not describe any chronic diarrhea symptoms.  Denies any respiratory symptoms such as cough, nasal congestion, dyspnea, or any loss of taste or smell.  No myalgias.  She states her mouth feels "raw ".  She has not seen any actual sores or lesions.   Nothing that would resemble thrush.  No known exposures to anyone sick.  Her kids do attend school but are asymptomatic   Observations/Objective: Patient sounds cheerful and well on the phone. I do not appreciate any SOB. Speech and thought processing are grossly intact. Patient reported vitals:  Assessment and Plan: Jordan Mayer relates several day history of some nonspecific odor with urine and recent mild burning but none currently.  Cannot rule out uncomplicated cystitis.  She also relates 5-day history of relatively mild diarrhea.  We discussed the fact that Covid can present with diarrhea and even occasional urinary symptoms.  She does not have any respiratory symptoms.  She is nontoxic.  We discussed the following  -We have suggested she consider Covid testing to help clarify -Recommend stay quarantined until further clarified -Consider Cipro 500 mg twice daily for 3 days -If Covid testing negative and urine symptoms persist needs further evaluation with urinalysis and culture -Discussed bland diet and plenty of fluids for her mild diarrhea symptoms.  Follow-up immediately for any increased fever, abdominal pain, bloody stools, etc.  Follow Up Instructions:  -As above   99441 5-10 99442 11-20 99443 21-30 I did not refer this patient for an OV in the next 24 hours for this/these issue(s).  I discussed the assessment and treatment plan with the patient. The patient was provided an opportunity to ask questions and all were answered. The patient agreed with the plan and demonstrated an understanding of the instructions.   The patient was advised to call back or seek an in-person evaluation if the  symptoms worsen or if the condition fails to improve as anticipated.  I provided 20 minutes of non-face-to-face time during this encounter.   Carolann Littler, MD

## 2019-08-09 DIAGNOSIS — Z20828 Contact with and (suspected) exposure to other viral communicable diseases: Secondary | ICD-10-CM | POA: Diagnosis not present

## 2019-08-15 ENCOUNTER — Other Ambulatory Visit: Payer: Self-pay

## 2019-08-15 ENCOUNTER — Encounter (HOSPITAL_COMMUNITY): Payer: Self-pay | Admitting: Emergency Medicine

## 2019-08-15 ENCOUNTER — Ambulatory Visit (HOSPITAL_COMMUNITY)
Admission: EM | Admit: 2019-08-15 | Discharge: 2019-08-15 | Disposition: A | Discharge status: Home / Self Care | Payer: BC Managed Care – PPO | Attending: Family Medicine | Admitting: Family Medicine

## 2019-08-15 DIAGNOSIS — R197 Diarrhea, unspecified: Secondary | ICD-10-CM | POA: Diagnosis not present

## 2019-08-15 DIAGNOSIS — Z3202 Encounter for pregnancy test, result negative: Secondary | ICD-10-CM | POA: Diagnosis not present

## 2019-08-15 DIAGNOSIS — Z9049 Acquired absence of other specified parts of digestive tract: Secondary | ICD-10-CM | POA: Diagnosis not present

## 2019-08-15 DIAGNOSIS — M545 Low back pain: Secondary | ICD-10-CM | POA: Diagnosis not present

## 2019-08-15 DIAGNOSIS — G8929 Other chronic pain: Secondary | ICD-10-CM | POA: Diagnosis not present

## 2019-08-15 DIAGNOSIS — Z79899 Other long term (current) drug therapy: Secondary | ICD-10-CM | POA: Diagnosis not present

## 2019-08-15 DIAGNOSIS — M542 Cervicalgia: Secondary | ICD-10-CM | POA: Diagnosis not present

## 2019-08-15 DIAGNOSIS — Z9884 Bariatric surgery status: Secondary | ICD-10-CM | POA: Diagnosis not present

## 2019-08-15 DIAGNOSIS — M5003 Cervical disc disorder with myelopathy, cervicothoracic region: Secondary | ICD-10-CM | POA: Diagnosis not present

## 2019-08-15 DIAGNOSIS — Z880 Allergy status to penicillin: Secondary | ICD-10-CM | POA: Insufficient documentation

## 2019-08-15 LAB — POCT URINALYSIS DIP (DEVICE)
Bilirubin Urine: NEGATIVE
Glucose, UA: NEGATIVE mg/dL
Hgb urine dipstick: NEGATIVE
Ketones, ur: NEGATIVE mg/dL
Leukocytes,Ua: NEGATIVE
Nitrite: NEGATIVE
Protein, ur: NEGATIVE mg/dL
Specific Gravity, Urine: 1.015 (ref 1.005–1.030)
Urobilinogen, UA: 0.2 mg/dL (ref 0.0–1.0)
pH: 5.5 (ref 5.0–8.0)

## 2019-08-15 LAB — CBC WITH DIFFERENTIAL/PLATELET
Abs Immature Granulocytes: 0.02 10*3/uL (ref 0.00–0.07)
Basophils Absolute: 0 10*3/uL (ref 0.0–0.1)
Basophils Relative: 0 %
Eosinophils Absolute: 0.1 10*3/uL (ref 0.0–0.5)
Eosinophils Relative: 2 %
HCT: 38.5 % (ref 36.0–46.0)
Hemoglobin: 12.8 g/dL (ref 12.0–15.0)
Immature Granulocytes: 0 %
Lymphocytes Relative: 21 %
Lymphs Abs: 1.4 10*3/uL (ref 0.7–4.0)
MCH: 29 pg (ref 26.0–34.0)
MCHC: 33.2 g/dL (ref 30.0–36.0)
MCV: 87.1 fL (ref 80.0–100.0)
Monocytes Absolute: 0.9 10*3/uL (ref 0.1–1.0)
Monocytes Relative: 13 %
Neutro Abs: 4.3 10*3/uL (ref 1.7–7.7)
Neutrophils Relative %: 64 %
Platelets: 224 10*3/uL (ref 150–400)
RBC: 4.42 MIL/uL (ref 3.87–5.11)
RDW: 12.5 % (ref 11.5–15.5)
WBC: 6.8 10*3/uL (ref 4.0–10.5)
nRBC: 0 % (ref 0.0–0.2)

## 2019-08-15 LAB — COMPREHENSIVE METABOLIC PANEL
ALT: 49 U/L — ABNORMAL HIGH (ref 0–44)
AST: 16 U/L (ref 15–41)
Albumin: 3.9 g/dL (ref 3.5–5.0)
Alkaline Phosphatase: 91 U/L (ref 38–126)
Anion gap: 11 (ref 5–15)
BUN: 10 mg/dL (ref 6–20)
CO2: 22 mmol/L (ref 22–32)
Calcium: 9.3 mg/dL (ref 8.9–10.3)
Chloride: 108 mmol/L (ref 98–111)
Creatinine, Ser: 1.01 mg/dL — ABNORMAL HIGH (ref 0.44–1.00)
GFR calc Af Amer: >60 mL/min (ref >60)
GFR calc non Af Amer: >60 mL/min (ref >60)
Glucose, Bld: 93 mg/dL (ref 70–99)
Potassium: 3.4 mmol/L — ABNORMAL LOW (ref 3.5–5.1)
Sodium: 141 mmol/L (ref 135–145)
Total Bilirubin: 0.5 mg/dL (ref 0.3–1.2)
Total Protein: 7 g/dL (ref 6.5–8.1)

## 2019-08-15 LAB — POCT PREGNANCY, URINE: Preg Test, Ur: NEGATIVE

## 2019-08-15 LAB — POC URINE PREG, ED: Preg Test, Ur: NEGATIVE

## 2019-08-15 LAB — AMYLASE: Amylase: 37 U/L (ref 28–100)

## 2019-08-15 LAB — SEDIMENTATION RATE: Sed Rate: 5 mm/hr (ref 0–22)

## 2019-08-15 LAB — C-REACTIVE PROTEIN: CRP: 0.7 mg/dL (ref <1.0)

## 2019-08-15 LAB — LIPASE, BLOOD: Lipase: 27 U/L (ref 11–51)

## 2019-08-15 MED ORDER — DICYCLOMINE HCL 20 MG PO TABS: 20.0000 mg | ORAL_TABLET | Freq: Two times a day (BID) | ORAL | 0 refills | Status: DC

## 2019-08-15 NOTE — ED Provider Notes (Signed)
[place]    CSN: [id number] Arrival date & time: 08/15/19  1841      History   Chief Complaint Chief Complaint  Patient presents with  . Diarrhea    HPI MAIZLEY EILERTSON is a 49 y.o. female.  Presenting with ongoing diarrhea. She had one bout today. She had a course of Cipro which seemed to improve her symptoms initially. Her symptoms started roughly 3 weeks ago. She denies any fevers. It seems to be watery with no blood associated with it. No history of similar symptoms. Has a history of gastric sleeve bypass. History of cholecystectomy. Has a discomfort in her lower abdomen and has been treated with Cipro for possible urinary tract infection. She has completed the course of the antibiotic. Having some upper middle abdominal pain as well. Denies any rashes. Denies any family contribution.  HPI  Past Medical History:  Diagnosis Date  . ADD (attention deficit disorder)   . Allergy   . Asthma   . Chicken pox   . Depression   . PCOS (polycystic ovarian syndrome)   . Renal disorder    kidney disease  . 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 Shingles     Patient Active Problem List   Diagnosis Date Noted  . Varicose veins of leg with complications [id number]  . Varicose veins of lower extremities with other complications [id number]  . Vertigo 12/23/2011  . Otalgia of both ears 09/17/2010  . SLEEP DISORDER/DISTURBANCE 09/27/2008  . FATIGUE 07/15/2008  . ADHD 12/05/2007  . HEADACHE 11/26/2007  . ALLERGIC RHINITIS 10/13/2007  . NEPHROLITHIASIS, HX OF 10/13/2007  . NONSPECIFIC MESENTERIC LYMPHADENITIS 03/17/2007  . RENAL CALCULUS, LEFT 03/17/2007  . DEPRESSION 03/07/2007  . DYSPNEA ON EXERTION 03/07/2007    Past Surgical History:  Procedure Laterality Date  . BUNIONECTOMY    . LAPAROSCOPIC CHOLECYSTECTOMY SINGLE SITE WITH INTRAOPERATIVE CHOLANGIOGRAM N/A 06/14/2017   Procedure: LAPAROSCOPIC CHOLECYSTECTOMY SINGLE SITE;  Surgeon: Michael Boston, MD;  Location: [facility];   Service: General;  Laterality: N/A;  . LAPAROSCOPIC GASTRIC SLEEVE RESECTION  09/2013  . LASER ABLATION     Vascular  . TONSILLECTOMY    . WISDOM TOOTH EXTRACTION      OB History    Gravida  2   Para  2   Term  1   Preterm  1   AB      Living        SAB      TAB      Ectopic      Multiple      Live Births               Home Medications    Prior to Admission medications   Medication Sig Start Date End Date Taking? Authorizing Provider  Multiple Vitamin (MULTIVITAMIN) tablet Take 1 tablet by mouth daily.     Yes [provider]  spironolactone (ALDACTONE) 100 MG tablet Take 200 mg by mouth daily.    Yes [provider]  topiramate (TOPAMAX) 25 MG tablet Take 2 tablets (50 mg total) by mouth at bedtime. Or as directed 06/21/19  Yes Panosh, Standley Brooking, MD  traZODone (DESYREL) 100 MG tablet Take 200 mg by mouth at bedtime.  11/03/13  Yes Baker, Meridith, NP  triamcinolone (NASACORT ALLERGY 24HR) 55 MCG/ACT AERO nasal inhaler Place 1 spray into the nose daily.   Yes [provider]  ciprofloxacin (CIPRO) 500 MG tablet Take 1 tablet (500 mg total) by mouth  2 (two) times daily. 08/08/19   Burchette, Alinda Sierras, MD  clindamycin (CLEOCIN T) 1 % lotion Apply 1 application topically once a week.    [provider]  cyclobenzaprine (FLEXERIL) 10 MG tablet Take 10 mg by mouth daily as needed for muscle spasms.     [provider]  dicyclomine (BENTYL) 20 MG tablet Take 1 tablet (20 mg total) by mouth 2 (two) times daily. 08/15/19   Rosemarie Ax, MD  EPINEPHrine 0.3 mg/0.3 mL IJ SOAJ injection Inject 0.3 mLs into the skin daily as needed (allergic reactions).     [provider]  L-Methylfolate-Algae (DEPLIN 15 PO) Take 1 tablet by mouth.    Johnnye Sima, Amy, DO  naproxen (NAPROSYN) 250 MG tablet TAKE 2 TABS BY MOUTH AT ONSET OF HEADACHE AND THEN 1 EVERY 8 HOURS AS NEEDED 04/02/19   Panosh, Standley Brooking, MD  ondansetron (ZOFRAN ODT) 4 MG  disintegrating tablet Take 1 tablet (4 mg total) by mouth every 8 (eight) hours as needed for nausea. 05/08/19   Panosh, Standley Brooking, MD  rizatriptan (MAXALT-MLT) 10 MG disintegrating tablet Take 1 tablet (10 mg total) by mouth as needed for migraine. May repeat in 2 hours if needed 05/08/19   Panosh, Standley Brooking, MD  tretinoin (RETIN-A) 0.025 % cream Apply 1 application topically daily as needed.    [provider]    Family History Family History  Problem Relation Age of Onset  . Varicose Veins Mother   . Asthma Mother   . Varicose Veins Sister   . Birth defects Maternal Aunt        Cognitively Challenged  . Miscarriages / Stillbirths Maternal Aunt   . Varicose Veins Maternal Aunt   . Cancer Paternal Aunt        Melanoma  . Hearing loss Paternal Aunt   . Varicose Veins Paternal Aunt   . Arthritis Maternal Grandmother   . Arthritis Paternal Grandfather   . Hearing loss Paternal Grandfather     Social History Social History   Tobacco Use  . Smoking status: Never Smoker  . Smokeless tobacco: Never Used  Substance Use Topics  . Alcohol use: Yes    Alcohol/week: 0.0 standard drinks    Comment: occasional  . Drug use: No     Allergies   Contrast media [iodinated diagnostic agents], Iohexol, Penicillins, and Gadolinium derivatives   Review of Systems Review of Systems See HPI  Physical Exam Triage Vital Signs ED Triage Vitals  Enc Vitals Group     BP 08/15/19 1914 109/75     Pulse Rate 08/15/19 1914 100     Resp 08/15/19 1914 18     Temp 08/15/19 1914 98.8 F (37.1 C)     Temp Source 08/15/19 1914 Oral     SpO2 08/15/19 1914 100 %     Weight --      Height --      Head Circumference --      Peak Flow --      Pain Score 08/15/19 1909 5     Pain Loc --      Pain Edu? --      Excl. in [place]? --    No data found.  Updated Vital Signs BP 109/75 (BP Location: Left Arm)   Pulse 100   Temp 98.8 F (37.1 C) (Oral)   Resp 18   SpO2 100%   Visual  Acuity Right Eye Distance:   Left Eye Distance:   Bilateral  Distance:    Right Eye Near:   Left Eye Near:    Bilateral Near:     Physical Exam Gen: NAD, alert, cooperative with exam, well-appearing ENT: normal lips, normal nasal mucosa,  Eye: normal EOM, normal conjunctiva and lids Resp: no accessory muscle use, non-labored,  GI: no masses, tenderness in the upper quadrant, soft, nondistended, no hernia  Skin: no rashes, no areas of induration  Neuro: normal tone, normal sensation to touch    UC Treatments / Results  Labs (all labs ordered are listed, but only abnormal results are displayed) Labs Reviewed  COMPREHENSIVE METABOLIC PANEL  CBC WITH DIFFERENTIAL/PLATELET  AMYLASE  LIPASE, BLOOD  SEDIMENTATION RATE  C-REACTIVE PROTEIN  POCT URINALYSIS DIP (DEVICE)  POC URINE PREG, ED  POCT PREGNANCY, URINE    EKG   Radiology No results found.  Procedures Procedures (including critical care time)  Medications Ordered in UC Medications - No data to display  Initial Impression / Assessment and Plan / UC Course  I have reviewed the triage vital signs and the nursing notes.  Pertinent labs & imaging results that were available during my care of the patient were reviewed by me and considered in my medical decision making (see chart for details).     Ms. Andazola is a 49 year old female is presenting with ongoing diarrhea.  Had some resolution while on the Cipro but has since returned.  Seems more watery or secretory in nature.  It has been nonbloody.  No history of similar occasions.  Has been ongoing for roughly 3 weeks.  She only had 1 movement today.  Has a history of gastric sleeve roughly 5 years ago.  Having some upper abdominal pain which could be consistent with pancreatitis.  Urinalysis did not show infection and pregnancy test was negative.  Will check inflammatory markers and amylase and lipase.  Will check metabolic panel and complete blood count.  We will call  with results.  Provided Bentyl.  Given indications to follow-up and return.  Final Clinical Impressions(s) / UC Diagnoses   Final diagnoses:  Diarrhea, unspecified type     Discharge Instructions     Please try the bentyl  I will call with the results.  Please follow up if your symptoms fail to improve.     ED Prescriptions    Medication Sig Dispense Auth. Provider   dicyclomine (BENTYL) 20 MG tablet Take 1 tablet (20 mg total) by mouth 2 (two) times daily. 20 tablet Rosemarie Ax, MD     PDMP not reviewed this encounter.   Rosemarie Ax, MD 08/15/19 [phone]

## 2019-08-15 NOTE — Discharge Instructions (Signed)
Please try the bentyl  I will call with the results.  Please follow up if your symptoms fail to improve.

## 2019-08-15 NOTE — ED Triage Notes (Signed)
Diarrhea for 3 weeks covid test last week and negative  pcp put patient on 3 days of cipro in case she has a uti, and could potentially help diarrhea.

## 2019-08-16 ENCOUNTER — Telehealth: Payer: Self-pay | Admitting: Family Medicine

## 2019-08-16 NOTE — Telephone Encounter (Signed)
Informed of results.   Rosemarie Ax, MD [facility] 08/16/2019, 10:39 AM

## 2019-10-09 DIAGNOSIS — Z79899 Other long term (current) drug therapy: Secondary | ICD-10-CM | POA: Diagnosis not present

## 2019-10-09 DIAGNOSIS — F419 Anxiety disorder, unspecified: Secondary | ICD-10-CM | POA: Diagnosis not present

## 2019-10-09 DIAGNOSIS — F902 Attention-deficit hyperactivity disorder, combined type: Secondary | ICD-10-CM | POA: Diagnosis not present

## 2019-10-10 DIAGNOSIS — L700 Acne vulgaris: Secondary | ICD-10-CM | POA: Diagnosis not present

## 2019-10-10 DIAGNOSIS — L810 Postinflammatory hyperpigmentation: Secondary | ICD-10-CM | POA: Diagnosis not present

## 2019-11-01 DIAGNOSIS — F411 Generalized anxiety disorder: Secondary | ICD-10-CM | POA: Diagnosis not present

## 2019-11-01 DIAGNOSIS — F605 Obsessive-compulsive personality disorder: Secondary | ICD-10-CM | POA: Diagnosis not present

## 2019-12-07 ENCOUNTER — Other Ambulatory Visit: Payer: Self-pay | Admitting: Internal Medicine

## 2019-12-07 NOTE — Telephone Encounter (Signed)
These are not the same instructions as the last Rx sent??  Last OV 08/08/2019 (Virtual Visit)  Last filled 06/21/2019, #180 with 0 refill

## 2019-12-07 NOTE — Telephone Encounter (Signed)
You are correct .please contact pharmacy   And ok to refill the current correct rx of taking 2 50 hs  for 6 mos

## 2020-01-28 DIAGNOSIS — J3081 Allergic rhinitis due to animal (cat) (dog) hair and dander: Secondary | ICD-10-CM | POA: Diagnosis not present

## 2020-01-28 DIAGNOSIS — J3089 Other allergic rhinitis: Secondary | ICD-10-CM | POA: Diagnosis not present

## 2020-01-28 DIAGNOSIS — J45990 Exercise induced bronchospasm: Secondary | ICD-10-CM | POA: Diagnosis not present

## 2020-01-28 DIAGNOSIS — J301 Allergic rhinitis due to pollen: Secondary | ICD-10-CM | POA: Diagnosis not present

## 2020-01-30 DIAGNOSIS — G4709 Other insomnia: Secondary | ICD-10-CM | POA: Diagnosis not present

## 2020-01-30 DIAGNOSIS — Z79899 Other long term (current) drug therapy: Secondary | ICD-10-CM | POA: Diagnosis not present

## 2020-01-30 DIAGNOSIS — F419 Anxiety disorder, unspecified: Secondary | ICD-10-CM | POA: Diagnosis not present

## 2020-01-30 DIAGNOSIS — F902 Attention-deficit hyperactivity disorder, combined type: Secondary | ICD-10-CM | POA: Diagnosis not present

## 2020-04-28 DIAGNOSIS — Z03818 Encounter for observation for suspected exposure to other biological agents ruled out: Secondary | ICD-10-CM | POA: Diagnosis not present

## 2020-04-28 DIAGNOSIS — J029 Acute pharyngitis, unspecified: Secondary | ICD-10-CM | POA: Diagnosis not present

## 2020-04-28 DIAGNOSIS — H66001 Acute suppurative otitis media without spontaneous rupture of ear drum, right ear: Secondary | ICD-10-CM | POA: Diagnosis not present

## 2020-04-28 DIAGNOSIS — Z20822 Contact with and (suspected) exposure to covid-19: Secondary | ICD-10-CM | POA: Diagnosis not present

## 2020-05-01 DIAGNOSIS — F411 Generalized anxiety disorder: Secondary | ICD-10-CM | POA: Diagnosis not present

## 2020-05-01 DIAGNOSIS — F605 Obsessive-compulsive personality disorder: Secondary | ICD-10-CM | POA: Diagnosis not present

## 2020-05-05 DIAGNOSIS — F902 Attention-deficit hyperactivity disorder, combined type: Secondary | ICD-10-CM | POA: Diagnosis not present

## 2020-05-05 DIAGNOSIS — F419 Anxiety disorder, unspecified: Secondary | ICD-10-CM | POA: Diagnosis not present

## 2020-05-05 DIAGNOSIS — Z79899 Other long term (current) drug therapy: Secondary | ICD-10-CM | POA: Diagnosis not present

## 2020-05-19 DIAGNOSIS — Z20822 Contact with and (suspected) exposure to covid-19: Secondary | ICD-10-CM | POA: Diagnosis not present

## 2020-05-19 DIAGNOSIS — Z03818 Encounter for observation for suspected exposure to other biological agents ruled out: Secondary | ICD-10-CM | POA: Diagnosis not present

## 2020-06-01 ENCOUNTER — Emergency Department (HOSPITAL_BASED_OUTPATIENT_CLINIC_OR_DEPARTMENT_OTHER)
Admission: EM | Admit: 2020-06-01 | Discharge: 2020-06-02 | Disposition: A | Discharge status: Home / Self Care | Payer: BC Managed Care – PPO | Attending: Emergency Medicine | Admitting: Emergency Medicine

## 2020-06-01 ENCOUNTER — Other Ambulatory Visit: Payer: Self-pay

## 2020-06-01 ENCOUNTER — Encounter (HOSPITAL_BASED_OUTPATIENT_CLINIC_OR_DEPARTMENT_OTHER): Payer: Self-pay | Admitting: Emergency Medicine

## 2020-06-01 DIAGNOSIS — M160 Bilateral primary osteoarthritis of hip: Secondary | ICD-10-CM | POA: Diagnosis not present

## 2020-06-01 DIAGNOSIS — R197 Diarrhea, unspecified: Secondary | ICD-10-CM | POA: Diagnosis not present

## 2020-06-01 DIAGNOSIS — Z7951 Long term (current) use of inhaled steroids: Secondary | ICD-10-CM | POA: Insufficient documentation

## 2020-06-01 DIAGNOSIS — I878 Other specified disorders of veins: Secondary | ICD-10-CM | POA: Diagnosis not present

## 2020-06-01 DIAGNOSIS — R1111 Vomiting without nausea: Secondary | ICD-10-CM | POA: Diagnosis not present

## 2020-06-01 DIAGNOSIS — R1013 Epigastric pain: Secondary | ICD-10-CM

## 2020-06-01 DIAGNOSIS — N200 Calculus of kidney: Secondary | ICD-10-CM | POA: Diagnosis not present

## 2020-06-01 DIAGNOSIS — J45909 Unspecified asthma, uncomplicated: Secondary | ICD-10-CM | POA: Insufficient documentation

## 2020-06-01 DIAGNOSIS — K859 Acute pancreatitis without necrosis or infection, unspecified: Secondary | ICD-10-CM | POA: Insufficient documentation

## 2020-06-01 MED ORDER — SODIUM CHLORIDE 0.9 % IV BOLUS
1000.0000 mL | Freq: Once | INTRAVENOUS | Status: AC
  Administered 2020-06-01: 1000 mL via INTRAVENOUS

## 2020-06-01 MED ORDER — ONDANSETRON HCL 4 MG/2ML IJ SOLN
4.0000 mg | Freq: Once | INTRAMUSCULAR | Status: AC
  Administered 2020-06-01: 4 mg via INTRAVENOUS
  Filled 2020-06-01: qty 2

## 2020-06-01 MED ORDER — MORPHINE SULFATE (PF) 4 MG/ML IV SOLN
4.0000 mg | Freq: Once | INTRAVENOUS | Status: AC
  Administered 2020-06-01: 4 mg via INTRAVENOUS
  Filled 2020-06-01: qty 1

## 2020-06-01 NOTE — ED Triage Notes (Signed)
Reports diarrhea for a couple of days, states abdominal pain onset today. Nausea, no vomiting

## 2020-06-01 NOTE — ED Provider Notes (Signed)
[facility] Provider Note   CSN: [id number] Arrival date & time: 06/01/20  2242     History Chief Complaint  Patient presents with   Abdominal Pain   Diarrhea    Jordan Mayer is a 50 y.o. female.  HPI     This is a 50 year old female with a history of PCOS, asthma, gastric sleeve who presents with abdominal pain.  Patient reports intense epigastric pain.  She states that it comes and goes.  It has been ongoing over the last day.  She has a history of similar pain in the past which was attributed to her gallbladder.  She had her gallbladder removed but pain has recurred.  It is in the epigastrium and at times radiates into her right shoulder.  It does not seem to be associated with food.  Nothing seems to make it better or worse.  She reports taking multiple over-the-counter medications with minimal to no relief.  Currently her pain is 4 out of 10.  She denies any fevers, cough, shortness of breath.  She reports several day history of diarrhea which has been nonbloody.  She reports nausea without vomiting.  She reports no significant NSAID use or alcohol use.  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    Shingles     Patient Active Problem List   Diagnosis Date Noted   Varicose veins of leg with complications [id number]   Varicose veins of lower extremities with other complications 0000000   Vertigo 12/23/2011   Otalgia of both ears 09/17/2010   SLEEP DISORDER/DISTURBANCE 09/27/2008   FATIGUE 07/15/2008   ADHD 12/05/2007   HEADACHE 11/26/2007   ALLERGIC RHINITIS 10/13/2007   NEPHROLITHIASIS, HX OF 10/13/2007   NONSPECIFIC MESENTERIC LYMPHADENITIS 03/17/2007   RENAL CALCULUS, LEFT  03/17/2007   DEPRESSION 03/07/2007   DYSPNEA ON EXERTION 03/07/2007    Past Surgical History:  Procedure Laterality Date   BUNIONECTOMY     LAPAROSCOPIC CHOLECYSTECTOMY SINGLE SITE WITH INTRAOPERATIVE CHOLANGIOGRAM N/A 06/14/2017   Procedure: LAPAROSCOPIC CHOLECYSTECTOMY SINGLE SITE;  Surgeon: Michael Boston, MD;  Location: [facility];  Service: General;  Laterality: N/A;   LAPAROSCOPIC GASTRIC SLEEVE RESECTION  09/2013   LASER ABLATION     Vascular   TONSILLECTOMY     WISDOM TOOTH EXTRACTION       OB History    Gravida  2   Para  2   Term  1   Preterm  1   AB      Living        SAB      IAB      Ectopic      Multiple      Live Births              Family History  Problem Relation Age of Onset   Varicose Veins Mother    Asthma Mother    Varicose Veins Sister    Birth defects Maternal Aunt        Cognitively Challenged   Miscarriages / Stillbirths Maternal Aunt    Varicose Veins Maternal Aunt    Cancer Paternal Aunt        Melanoma  Hearing loss Paternal Aunt    Varicose Veins Paternal Aunt    Arthritis Maternal Grandmother    Arthritis Paternal Grandfather    Hearing loss Paternal Grandfather     Social History   Tobacco Use   Smoking status: Never Smoker   Smokeless tobacco: Never Used  Vaping Use   Vaping Use: Never used  Substance Use Topics   Alcohol use: Yes    Alcohol/week: 0.0 standard drinks    Comment: occasional   Drug use: No    Home Medications Prior to Admission medications   Medication Sig Start Date End Date Taking? Authorizing Provider  HYDROcodone-acetaminophen (NORCO/VICODIN) 5-325 MG tablet Take 1 tablet by mouth every 6 (six) hours as needed. 06/02/20  Yes Glender Augusta, Barbette Hair, MD  ciprofloxacin (CIPRO) 500 MG tablet Take 1 tablet (500 mg total) by mouth 2 (two) times daily. 08/08/19   Burchette, Alinda Sierras, MD  clindamycin (CLEOCIN T) 1 % lotion Apply 1 application topically once a week.    [provider]  cyclobenzaprine (FLEXERIL) 10 MG tablet Take 10 mg by mouth daily as needed for muscle spasms.     [provider]  dicyclomine (BENTYL) 20 MG tablet Take 1 tablet (20 mg total) by mouth 2 (two) times daily. 08/15/19   Rosemarie Ax, MD  EPINEPHrine 0.3 mg/0.3 mL IJ SOAJ injection Inject 0.3 mLs into the skin daily as needed (allergic reactions).     [provider]  L-Methylfolate-Algae (DEPLIN 15 PO) Take 1 tablet by mouth.    Johnnye Sima, Amy, DO  Multiple Vitamin (MULTIVITAMIN) tablet Take 1 tablet by mouth daily.      [provider]  naproxen (NAPROSYN) 250 MG tablet TAKE 2 TABS BY MOUTH AT ONSET OF HEADACHE AND THEN 1 EVERY 8 HOURS AS NEEDED 04/02/19   Panosh, Standley Brooking, MD  ondansetron (ZOFRAN ODT) 4 MG disintegrating tablet Take 1 tablet (4 mg total) by mouth every 8 (eight) hours as needed for nausea. 05/08/19   Panosh, Standley Brooking, MD  rizatriptan (MAXALT-MLT) 10 MG disintegrating tablet Take 1 tablet (10 mg total) by mouth as needed for migraine. May repeat in 2 hours if needed 05/08/19   Panosh, Standley Brooking, MD  spironolactone (ALDACTONE) 100 MG tablet Take 200 mg by mouth daily.     [provider]  topiramate (TOPAMAX) 25 MG tablet Take 2 tablets (50 mg total) by mouth at bedtime. 12/07/19   Panosh, Standley Brooking, MD  traZODone (DESYREL) 100 MG tablet Take 200 mg by mouth at bedtime.  11/03/13   Luana Shu, Meridith, NP  tretinoin (RETIN-A) 0.025 % cream Apply 1 application topically daily as needed.    [provider]  triamcinolone (NASACORT ALLERGY 24HR) 55 MCG/ACT AERO nasal inhaler Place 1 spray into the nose daily.    [provider]    Allergies    Contrast media [iodinated diagnostic agents], Iohexol, Penicillins, and Gadolinium derivatives  Review of Systems   Review of Systems  Constitutional: Negative for fever.  Respiratory: Negative for shortness of breath.   Cardiovascular: Negative for chest pain.  Gastrointestinal:  Positive for abdominal pain, diarrhea and nausea. Negative for vomiting.  Genitourinary: Negative for dysuria.  All other systems reviewed and are negative.   Physical Exam Updated Vital Signs BP 112/75 (BP Location: Left Arm)    Pulse 85    Temp 97.7 F (36.5 C)    Resp 18    Wt 73.8 kg    SpO2 98%  BMI 27.07 kg/m   Physical Exam Vitals and nursing note reviewed.  Constitutional:      Appearance: She is well-developed and well-nourished. She is not ill-appearing.  HENT:     Head: Normocephalic and atraumatic.     Mouth/Throat:     Mouth: Mucous membranes are moist.  Eyes:     Pupils: Pupils are equal, round, and reactive to light.  Cardiovascular:     Rate and Rhythm: Normal rate and regular rhythm.     Heart sounds: Normal heart sounds.  Pulmonary:     Effort: Pulmonary effort is normal. No respiratory distress.     Breath sounds: No wheezing.  Abdominal:     General: Bowel sounds are normal.     Palpations: Abdomen is soft.     Tenderness: There is abdominal tenderness in the epigastric area. There is no guarding.  Musculoskeletal:     Cervical back: Neck supple.  Skin:    General: Skin is warm and dry.  Neurological:     General: No focal deficit present.     Mental Status: She is alert and oriented to person, place, and time.  Psychiatric:        Mood and Affect: Mood and affect and mood normal.     ED Results / Procedures / Treatments   Labs (all labs ordered are listed, but only abnormal results are displayed) Labs Reviewed  CBC WITH DIFFERENTIAL/PLATELET - Abnormal; Notable for the following components:      Result Value   Neutro Abs 8.6 (*)    Lymphs Abs 0.6 (*)    All other components within normal limits  COMPREHENSIVE METABOLIC PANEL - Abnormal; Notable for the following components:   Glucose, Bld 125 (*)    Calcium 8.6 (*)    AST 65 (*)    ALT 48 (*)    All other components within normal limits  LIPASE, BLOOD - Abnormal; Notable for the  following components:   Lipase 158 (*)    All other components within normal limits  URINALYSIS, ROUTINE W REFLEX MICROSCOPIC - Abnormal; Notable for the following components:   Ketones, ur TRACE (*)    All other components within normal limits    EKG None  Radiology CT ABDOMEN PELVIS WO CONTRAST  Result Date: 06/02/2020 CLINICAL DATA:  Epigastric pain, nausea, no vomiting EXAM: CT ABDOMEN AND PELVIS WITHOUT CONTRAST TECHNIQUE: Multidetector CT imaging of the abdomen and pelvis was performed following the standard protocol without IV contrast. COMPARISON:  CT 06/05/2018 FINDINGS: Lower chest: Lung bases are clear. Normal heart size. No pericardial effusion. Hepatobiliary: No visible concerning liver lesions within limitations of this unenhanced CT. Normal liver attenuation. Smooth liver surface contour. Prior cholecystectomy. Slight prominence of of the biliary tree likely reflect some post cholecystectomy reservoir effect, unchanged from comparison imaging. No visible calcified gallstones. Pancreas: No pancreatic ductal dilatation or surrounding inflammatory changes. Spleen: Normal in size. No concerning splenic lesions. Adrenals/Urinary Tract: Normal adrenal glands. No visible or contour deforming renal lesions are evident on these unenhanced CT images. Punctate nonobstructing calculus in the interpolar left kidney. No obstructive urolithiasis or hydronephrosis. Few stable scattered phleboliths are seen towards the pelvis. Normal urinary bladder. Stomach/Bowel: Distal esophagus unremarkable. Postsurgical changes from prior gastric sleeve. Duodenum with a normal sweep across the midline abdomen. No focal small bowel thickening or dilatation is evident. Normal air-filled appendix arising from the cecal tip. No colonic dilatation or wall thickening. No evidence of bowel obstruction. Vascular/Lymphatic: Focal region of mid  mesenteric hazy stranding with numerous shotty, reactive appearing clustered mid  mesenteric lymph nodes suggestive of a mesenteritis (2/34). No pathologically enlarged abdominopelvic lymph nodes. No significant vascular findings. Reproductive: Normal appearance of the uterus and adnexal structures. Other: Trace low-attenuation free fluid in the deep pelvis is a nonspecific finding and may be physiologic in a reproductive age female. Suspect a dropped tubal ligation clip layering in the deep pelvis as well (2/76). No free air. No bowel containing hernias. Musculoskeletal: Mild degenerative changes in the spine hips and pelvis. No concerning osseous lesions. No acute osseous abnormalities. IMPRESSION: 1. Focal region of mid mesenteric hazy stranding with numerous shotty, reactive appearing clustered mid mesenteric lymph nodes suggestive of a mesenteritis. 2. Trace low-attenuation free fluid in the deep pelvis is a nonspecific finding and may be physiologic in a reproductive age female. 3. No other acute abnormalities in the abdomen or pelvis. 4. Suspect a dropped tubal ligation clip layering in the deep pelvis. 5. Punctate nonobstructing left nephrolithiasis. 6. Postsurgical changes from prior gastric sleeve. No gross complications. Electronically Signed   By: Lovena Le M.D.   On: 06/02/2020 00:45    Procedures Procedures (including critical care time)  Medications Ordered in ED Medications  morphine 4 MG/ML injection 4 mg (4 mg Intravenous Given 06/01/20 2343)  ondansetron (ZOFRAN) injection 4 mg (4 mg Intravenous Given 06/01/20 2342)  sodium chloride 0.9 % bolus 1,000 mL (0 mLs Intravenous Stopped 06/02/20 0100)  morphine 4 MG/ML injection 4 mg (4 mg Intravenous Given 06/02/20 0033)  oxyCODONE-acetaminophen (PERCOCET/ROXICET) 5-325 MG per tablet 1 tablet (1 tablet Oral Given 06/02/20 0138)    ED Course  I have reviewed the triage vital signs and the nursing notes.  Pertinent labs & imaging results that were available during my care of the patient were reviewed by me and  considered in my medical decision making (see chart for details).    MDM Rules/Calculators/A&P                          Patient presents with epigastric pain and diarrhea.  She is overall nontoxic and vital signs are reassuring.  She has pain in the epigastrium without Murphy sign.  She has history of cholecystectomy.  Other considerations include gastritis, peptic ulcer disease, pancreatitis.  Less likely appendicitis given location.  Labs reviewed.  No significant metabolic derangements.  No leukocytosis.  She does have a slight elevation in LFTs and lipase at 158.  She denies any alcohol use.  CT scan obtained.  Multiple incidental findings.  She has some inflammation of the mesentery and addition to nephrolithiasis.  Suspect her symptoms given location are related to mild pancreatitis.  On recheck, she states she feels somewhat better.  Will discharge home with pain medication and clear diet.  Recommend follow-up with PCP for triglyceride testing.  Patient was given strict return precautions.  After history, exam, and medical workup I feel the patient has been appropriately medically screened and is safe for discharge home. Pertinent diagnoses were discussed with the patient. Patient was given return precautions.  Final Clinical Impression(s) / ED Diagnoses Final diagnoses:  Epigastric pain  Acute pancreatitis, unspecified complication status, unspecified pancreatitis type    Rx / DC Orders ED Discharge Orders         Ordered    HYDROcodone-acetaminophen (NORCO/VICODIN) 5-325 MG tablet  Every 6 hours PRN        06/02/20 0140  Merryl Hacker, MD 06/02/20 [phone]

## 2020-06-01 NOTE — ED Notes (Signed)
Urine requested. Pt states unable to go at this time.

## 2020-06-02 ENCOUNTER — Emergency Department (HOSPITAL_BASED_OUTPATIENT_CLINIC_OR_DEPARTMENT_OTHER): Payer: BC Managed Care – PPO

## 2020-06-02 DIAGNOSIS — N200 Calculus of kidney: Secondary | ICD-10-CM | POA: Diagnosis not present

## 2020-06-02 DIAGNOSIS — I878 Other specified disorders of veins: Secondary | ICD-10-CM | POA: Diagnosis not present

## 2020-06-02 DIAGNOSIS — K859 Acute pancreatitis without necrosis or infection, unspecified: Secondary | ICD-10-CM | POA: Diagnosis not present

## 2020-06-02 DIAGNOSIS — R1111 Vomiting without nausea: Secondary | ICD-10-CM | POA: Diagnosis not present

## 2020-06-02 DIAGNOSIS — M160 Bilateral primary osteoarthritis of hip: Secondary | ICD-10-CM | POA: Diagnosis not present

## 2020-06-02 DIAGNOSIS — R1013 Epigastric pain: Secondary | ICD-10-CM | POA: Diagnosis not present

## 2020-06-02 LAB — COMPREHENSIVE METABOLIC PANEL
ALT: 48 U/L — ABNORMAL HIGH (ref 0–44)
AST: 65 U/L — ABNORMAL HIGH (ref 15–41)
Albumin: 3.6 g/dL (ref 3.5–5.0)
Alkaline Phosphatase: 79 U/L (ref 38–126)
Anion gap: 10 (ref 5–15)
BUN: 13 mg/dL (ref 6–20)
CO2: 22 mmol/L (ref 22–32)
Calcium: 8.6 mg/dL — ABNORMAL LOW (ref 8.9–10.3)
Chloride: 104 mmol/L (ref 98–111)
Creatinine, Ser: 0.9 mg/dL (ref 0.44–1.00)
GFR, Estimated: >60 mL/min (ref >60)
Glucose, Bld: 125 mg/dL — ABNORMAL HIGH (ref 70–99)
Potassium: 4.2 mmol/L (ref 3.5–5.1)
Sodium: 136 mmol/L (ref 135–145)
Total Bilirubin: 0.7 mg/dL (ref 0.3–1.2)
Total Protein: 6.8 g/dL (ref 6.5–8.1)

## 2020-06-02 LAB — CBC WITH DIFFERENTIAL/PLATELET
Abs Immature Granulocytes: 0.04 10*3/uL (ref 0.00–0.07)
Basophils Absolute: 0 10*3/uL (ref 0.0–0.1)
Basophils Relative: 0 %
Eosinophils Absolute: 0 10*3/uL (ref 0.0–0.5)
Eosinophils Relative: 0 %
HCT: 40.1 % (ref 36.0–46.0)
Hemoglobin: 13.6 g/dL (ref 12.0–15.0)
Immature Granulocytes: 0 %
Lymphocytes Relative: 7 %
Lymphs Abs: 0.6 10*3/uL — ABNORMAL LOW (ref 0.7–4.0)
MCH: 29.5 pg (ref 26.0–34.0)
MCHC: 33.9 g/dL (ref 30.0–36.0)
MCV: 87 fL (ref 80.0–100.0)
Monocytes Absolute: 0.6 10*3/uL (ref 0.1–1.0)
Monocytes Relative: 6 %
Neutro Abs: 8.6 10*3/uL — ABNORMAL HIGH (ref 1.7–7.7)
Neutrophils Relative %: 87 %
Platelets: 231 10*3/uL (ref 150–400)
RBC: 4.61 MIL/uL (ref 3.87–5.11)
RDW: 12.6 % (ref 11.5–15.5)
WBC: 9.9 10*3/uL (ref 4.0–10.5)
nRBC: 0 % (ref 0.0–0.2)

## 2020-06-02 LAB — URINALYSIS, ROUTINE W REFLEX MICROSCOPIC
Bilirubin Urine: NEGATIVE
Glucose, UA: NEGATIVE mg/dL
Hgb urine dipstick: NEGATIVE
Leukocytes,Ua: NEGATIVE
Nitrite: NEGATIVE
Protein, ur: NEGATIVE mg/dL
Specific Gravity, Urine: 1.02 (ref 1.005–1.030)
pH: 5 (ref 5.0–8.0)

## 2020-06-02 LAB — LIPASE, BLOOD: Lipase: 158 U/L — ABNORMAL HIGH (ref 11–51)

## 2020-06-02 MED ORDER — MORPHINE SULFATE (PF) 4 MG/ML IV SOLN
4.0000 mg | Freq: Once | INTRAVENOUS | Status: AC
  Administered 2020-06-02: 4 mg via INTRAVENOUS
  Filled 2020-06-02: qty 1

## 2020-06-02 MED ORDER — HYDROCODONE-ACETAMINOPHEN 5-325 MG PO TABS: 1.0000 | ORAL_TABLET | Freq: Four times a day (QID) | ORAL | 0 refills | Status: DC | PRN

## 2020-06-02 MED ORDER — OXYCODONE-ACETAMINOPHEN 5-325 MG PO TABS
1.0000 | ORAL_TABLET | Freq: Once | ORAL | Status: AC
  Administered 2020-06-02: 1 via ORAL
  Filled 2020-06-02: qty 1

## 2020-06-02 NOTE — Discharge Instructions (Addendum)
You are seen today and likely have some mild pancreatitis.  Start with a clear liquid diet and make sure to stay hydrated.  Advance her diet as tolerated.  Follow-up with your primary doctor for triglyceride testing.  If you have any new or worsening symptoms you should be reevaluated.

## 2020-06-02 NOTE — ED Notes (Signed)
PO challenge given  

## 2020-06-04 ENCOUNTER — Telehealth (INDEPENDENT_AMBULATORY_CARE_PROVIDER_SITE_OTHER): Payer: BC Managed Care – PPO | Admitting: Internal Medicine

## 2020-06-04 ENCOUNTER — Encounter: Payer: Self-pay | Admitting: Internal Medicine

## 2020-06-04 DIAGNOSIS — R7989 Other specified abnormal findings of blood chemistry: Secondary | ICD-10-CM

## 2020-06-04 DIAGNOSIS — R748 Abnormal levels of other serum enzymes: Secondary | ICD-10-CM | POA: Diagnosis not present

## 2020-06-04 DIAGNOSIS — R197 Diarrhea, unspecified: Secondary | ICD-10-CM

## 2020-06-04 DIAGNOSIS — Z9049 Acquired absence of other specified parts of digestive tract: Secondary | ICD-10-CM

## 2020-06-04 DIAGNOSIS — R945 Abnormal results of liver function studies: Secondary | ICD-10-CM | POA: Diagnosis not present

## 2020-06-04 DIAGNOSIS — Z9884 Bariatric surgery status: Secondary | ICD-10-CM

## 2020-06-04 DIAGNOSIS — R1013 Epigastric pain: Secondary | ICD-10-CM | POA: Diagnosis not present

## 2020-06-04 MED ORDER — COLESTIPOL HCL 1 G PO TABS: 1.0000 g | ORAL_TABLET | Freq: Two times a day (BID) | ORAL | 1 refills | Status: DC

## 2020-06-04 MED ORDER — ONDANSETRON 4 MG PO TBDP: 4.0000 mg | ORAL_TABLET | Freq: Three times a day (TID) | ORAL | 2 refills | Status: DC | PRN

## 2020-06-04 NOTE — Progress Notes (Signed)
Virtual Visit via Video Note  I connected with@ on 06/04/20 at  2:30 PM EST by a video enabled telemedicine application and verified that I am speaking with the correct person using two identifiers. Location patient: home Location provider:work  office Persons participating in the virtual visit: patient, provider  [place] national recommendations  regarding COVID 19 pandemic   video visit is advised over in office visit for this patient.  Patient aware  of the limitations of evaluation and management by telemedicine and  availability of in person appointments. and agreed to proceed.   HPI: Jordan Mayer presents for video visit.  F/u ed visit for severe epigastric colicky pain .   After weeks of diarrhea . Was told could have pancreatitis   Nl ct x quiescent stone  And elevated lipase 3 x uln  Mild elevated transaminases   Has had episode  f pain about  Yearly in "January" per husband.     Hx choley  And sleeve bariatric surgery  No new  meds  No vomitng Using immodium and not working but had oatmeal and helped some. zofran is helping nausea .  Hx of seeing [facility]off in past but he is winding down practice and not heard back so needs to get another GI team  Last seen by me aboutr a year ago for migtraines  Minimal to no etoh  other meds  ROS: See pertinent positives and negatives per HPI.  Past Medical History:  Diagnosis Date  . ADD (attention deficit disorder)   . Allergy   . Asthma   . Chicken pox   . Depression   . PCOS (polycystic ovarian syndrome)   . Renal disorder    kidney disease  . 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 Shingles     Past Surgical History:  Procedure Laterality Date  . BUNIONECTOMY    . LAPAROSCOPIC CHOLECYSTECTOMY SINGLE SITE WITH INTRAOPERATIVE CHOLANGIOGRAM N/A 06/14/2017   Procedure: LAPAROSCOPIC CHOLECYSTECTOMY SINGLE SITE;  Surgeon: Michael Boston, MD;  Location: [facility];  Service: General;  Laterality: N/A;  . LAPAROSCOPIC GASTRIC SLEEVE RESECTION   09/2013  . LASER ABLATION     Vascular  . TONSILLECTOMY    . WISDOM TOOTH EXTRACTION      Family History  Problem Relation Age of Onset  . Varicose Veins Mother   . Asthma Mother   . Varicose Veins Sister   . Birth defects Maternal Aunt        Cognitively Challenged  . Miscarriages / Stillbirths Maternal Aunt   . Varicose Veins Maternal Aunt   . Cancer Paternal Aunt        Melanoma  . Hearing loss Paternal Aunt   . Varicose Veins Paternal Aunt   . Arthritis Maternal Grandmother   . Arthritis Paternal Grandfather   . Hearing loss Paternal Grandfather     Social History   Tobacco Use  . Smoking status: Never Smoker  . Smokeless tobacco: Never Used  Vaping Use  . Vaping Use: Never used  Substance Use Topics  . Alcohol use: Yes    Alcohol/week: 0.0 standard drinks    Comment: occasional  . Drug use: No      Current Outpatient Medications:  .  colestipol (COLESTID) 1 g tablet, Take 1 tablet (1 g total) by mouth 2 (two) times daily. For diarrhea, Disp: 30 tablet, Rfl: 1 .  ciprofloxacin (CIPRO) 500 MG tablet, Take 1 tablet (500 mg total) by mouth 2 (two) times daily., Disp: 6 tablet,  Rfl: 0 .  clindamycin (CLEOCIN T) 1 % lotion, Apply 1 application topically once a week., Disp: , Rfl:  .  cyclobenzaprine (FLEXERIL) 10 MG tablet, Take 10 mg by mouth daily as needed for muscle spasms. , Disp: , Rfl:  .  dicyclomine (BENTYL) 20 MG tablet, Take 1 tablet (20 mg total) by mouth 2 (two) times daily., Disp: 20 tablet, Rfl: 0 .  EPINEPHrine 0.3 mg/0.3 mL IJ SOAJ injection, Inject 0.3 mLs into the skin daily as needed (allergic reactions). , Disp: , Rfl:  .  HYDROcodone-acetaminophen (NORCO/VICODIN) 5-325 MG tablet, Take 1 tablet by mouth every 6 (six) hours as needed., Disp: 10 tablet, Rfl: 0 .  L-Methylfolate-Algae (DEPLIN 15 PO), Take 1 tablet by mouth., Disp: , Rfl:  .  Multiple Vitamin (MULTIVITAMIN) tablet, Take 1 tablet by mouth daily.  , Disp: , Rfl:  .  naproxen (NAPROSYN)  250 MG tablet, TAKE 2 TABS BY MOUTH AT ONSET OF HEADACHE AND THEN 1 EVERY 8 HOURS AS NEEDED, Disp: 40 tablet, Rfl: 1 .  ondansetron (ZOFRAN ODT) 4 MG disintegrating tablet, Take 1 tablet (4 mg total) by mouth every 8 (eight) hours as needed for nausea., Disp: 15 tablet, Rfl: 2 .  rizatriptan (MAXALT-MLT) 10 MG disintegrating tablet, Take 1 tablet (10 mg total) by mouth as needed for migraine. May repeat in 2 hours if needed, Disp: 10 tablet, Rfl: 1 .  spironolactone (ALDACTONE) 100 MG tablet, Take 200 mg by mouth daily. , Disp: , Rfl:  .  topiramate (TOPAMAX) 25 MG tablet, Take 2 tablets (50 mg total) by mouth at bedtime., Disp: 180 tablet, Rfl: 1 .  traZODone (DESYREL) 100 MG tablet, Take 200 mg by mouth at bedtime. , Disp: , Rfl:  .  tretinoin (RETIN-A) 0.025 % cream, Apply 1 application topically daily as needed., Disp: , Rfl:  .  triamcinolone (NASACORT ALLERGY 24HR) 55 MCG/ACT AERO nasal inhaler, Place 1 spray into the nose daily., Disp: , Rfl:   EXAM: BP Readings from Last 3 Encounters:  06/02/20 112/75  08/15/19 109/75  03/02/19 108/78    VITALS per patient if applicable:  GENERAL: alert, oriented, appears well and in no acute distress  HEENT: atraumatic, conjunttiva clear, no obvious abnormalities on inspection of external nose and ears  NECK: normal movements of the head and neck  LUNGS: on inspection no signs of respiratory distress, breathing rate appears normal, no obvious gross SOB, gasping or wheezing  CV: no obvious cyanosis  MS: moves all visible extremities without noticeable abnormality  PSYCH/NEURO: pleasant and cooperative, no obvious depression or anxiety, speech and thought processing grossly intact Lab Results  Component Value Date   WBC 9.9 06/01/2020   HGB 13.6 06/01/2020   HCT 40.1 06/01/2020   PLT 231 06/01/2020   GLUCOSE 125 (H) 06/01/2020   CHOL 164 06/18/2019   TRIG 123 06/18/2019   HDL 51 06/18/2019   LDLCALC 91 06/18/2019   ALT 48 (H)  06/01/2020   AST 65 (H) 06/01/2020   NA 136 06/01/2020   K 4.2 06/01/2020   CL 104 06/01/2020   CREATININE 0.90 06/01/2020   BUN 13 06/01/2020   CO2 22 06/01/2020   TSH 2.59 06/18/2019  IMPRESSION: 1. Focal region of mid mesenteric hazy stranding with numerous shotty, reactive appearing clustered mid mesenteric lymph nodes suggestive of a mesenteritis. 2. Trace low-attenuation free fluid in the deep pelvis is a nonspecific finding and may be physiologic in a reproductive age female. 3. No other acute  abnormalities in the abdomen or pelvis. 4. Suspect a dropped tubal ligation clip layering in the deep pelvis. 5. Punctate nonobstructing left nephrolithiasis. 6. Postsurgical changes from prior gastric sleeve. No gross complications.   Electronically Signed   By: Lovena Le M.D.   On: 06/02/2020 00:45  ASSESSMENT AND PLAN:  Discussed the following assessment and plan:    ICD-10-CM   1. Epigastric pain  R10.13 Hepatic function panel    Lipid panel    Lipase    Hemoglobin A1c    Hepatitis C antibody    Hep B Surface Antibody    Hep B Surface Antigen    Hepatitis B core antibody, total    Ambulatory referral to Gastroenterology   episodic  2. Elevated lipase  R74.8 Hepatic function panel    Lipid panel    Lipase    Hemoglobin A1c    Hepatitis C antibody    Hep B Surface Antibody    Hep B Surface Antigen    Hepatitis B core antibody, total    Ambulatory referral to Gastroenterology  3. Abnormal LFTs  R94.5 Hepatic function panel    Lipid panel    Lipase    Hemoglobin A1c    Hepatitis C antibody    Hep B Surface Antibody    Hep B Surface Antigen    Hepatitis B core antibody, total    Ambulatory referral to Gastroenterology  4. Diarrhea, unspecified type  R19.7 Hepatic function panel    Lipid panel    Lipase    Hemoglobin A1c    Hepatitis C antibody    Hep B Surface Antibody    Hep B Surface Antigen    Hepatitis B core antibody, total    Ambulatory  referral to Gastroenterology   predated pain episode this time   5. S/P cholecystectomy  Z90.49 Hepatic function panel    Lipid panel    Lipase    Hemoglobin A1c    Hepatitis C antibody    Hep B Surface Antibody    Hep B Surface Antigen    Hepatitis B core antibody, total  6. S/P bariatric surgery  Z98.84 Hepatic function panel    Lipid panel    Lipase    Hemoglobin A1c    Hepatitis C antibody    Hep B Surface Antibody    Hep B Surface Antigen    Hepatitis B core antibody, total   CT c/w  Poss mesenteritis viral infection but has had previous  repeated  episodes of epigastric pain   So need evaluation  for other causes  Can try empiric cholestyramine type meds  Since imodium not that helpful    No colitis and no antibiotic   So doubt c diff  ? If med such as topamax  Could do this episodically Counseled.  Gi referral    Expectant management and discussion of plan and treatment with opportunity to ask questions and all were answered. The patient agreed with the plan and demonstrated an understanding of the instructions.   Advised to call back or seek an in-person evaluation if worsening  or having  further concerns . Return for depending   on how doing and fu .  Shanon Ace, MD

## 2020-06-05 ENCOUNTER — Encounter: Payer: Self-pay | Admitting: Nurse Practitioner

## 2020-06-12 ENCOUNTER — Other Ambulatory Visit (INDEPENDENT_AMBULATORY_CARE_PROVIDER_SITE_OTHER): Payer: BC Managed Care – PPO

## 2020-06-12 ENCOUNTER — Other Ambulatory Visit: Payer: Self-pay

## 2020-06-12 DIAGNOSIS — R197 Diarrhea, unspecified: Secondary | ICD-10-CM

## 2020-06-12 DIAGNOSIS — R945 Abnormal results of liver function studies: Secondary | ICD-10-CM

## 2020-06-12 DIAGNOSIS — R1013 Epigastric pain: Secondary | ICD-10-CM | POA: Diagnosis not present

## 2020-06-12 DIAGNOSIS — R748 Abnormal levels of other serum enzymes: Secondary | ICD-10-CM | POA: Diagnosis not present

## 2020-06-12 DIAGNOSIS — Z9049 Acquired absence of other specified parts of digestive tract: Secondary | ICD-10-CM

## 2020-06-12 DIAGNOSIS — R7989 Other specified abnormal findings of blood chemistry: Secondary | ICD-10-CM

## 2020-06-12 DIAGNOSIS — Z9884 Bariatric surgery status: Secondary | ICD-10-CM

## 2020-06-12 LAB — LIPID PANEL
Cholesterol: 187 mg/dL (ref 0–200)
HDL: 46.6 mg/dL (ref >39.00)
LDL Cholesterol: 102 mg/dL — ABNORMAL HIGH (ref 0–99)
NonHDL: 140.74
Total CHOL/HDL Ratio: 4
Triglycerides: 194 mg/dL — ABNORMAL HIGH (ref 0.0–149.0)
VLDL: 38.8 mg/dL (ref 0.0–40.0)

## 2020-06-12 LAB — HEPATIC FUNCTION PANEL
ALT: 29 U/L (ref 0–35)
AST: 14 U/L (ref 0–37)
Albumin: 3.8 g/dL (ref 3.5–5.2)
Alkaline Phosphatase: 69 U/L (ref 39–117)
Bilirubin, Direct: 0.1 mg/dL (ref 0.0–0.3)
Total Bilirubin: 0.5 mg/dL (ref 0.2–1.2)
Total Protein: 6.4 g/dL (ref 6.0–8.3)

## 2020-06-12 LAB — HEMOGLOBIN A1C: Hgb A1c MFr Bld: 5.5 % (ref 4.6–6.5)

## 2020-06-12 LAB — LIPASE: Lipase: 23 U/L (ref 11.0–59.0)

## 2020-06-13 ENCOUNTER — Other Ambulatory Visit: Payer: Self-pay | Admitting: Internal Medicine

## 2020-06-13 LAB — HEPATITIS B CORE ANTIBODY, TOTAL: Hep B Core Total Ab: NONREACTIVE

## 2020-06-13 LAB — HEPATITIS B SURFACE ANTIGEN: Hepatitis B Surface Ag: NONREACTIVE

## 2020-06-13 LAB — HEPATITIS B SURFACE ANTIBODY,QUALITATIVE: Hep B S Ab: REACTIVE — AB

## 2020-06-13 LAB — HEPATITIS C ANTIBODY
Hepatitis C Ab: NONREACTIVE
SIGNAL TO CUT-OFF: 0.01 (ref <1.00)

## 2020-06-15 NOTE — Progress Notes (Signed)
Liver and lipase backt to normal . Triglycerides  up slightly  Gi team should be able to review this  at upcoming visit

## 2020-06-18 ENCOUNTER — Encounter: Payer: Self-pay | Admitting: Nurse Practitioner

## 2020-06-18 ENCOUNTER — Ambulatory Visit (INDEPENDENT_AMBULATORY_CARE_PROVIDER_SITE_OTHER): Payer: No Typology Code available for payment source | Admitting: Nurse Practitioner

## 2020-06-18 VITALS — BP 90/64 | HR 76 | Ht 65.0 in | Wt 163.5 lb

## 2020-06-18 DIAGNOSIS — R194 Change in bowel habit: Secondary | ICD-10-CM | POA: Diagnosis not present

## 2020-06-18 DIAGNOSIS — R1013 Epigastric pain: Secondary | ICD-10-CM

## 2020-06-18 DIAGNOSIS — R9389 Abnormal findings on diagnostic imaging of other specified body structures: Secondary | ICD-10-CM

## 2020-06-18 DIAGNOSIS — Z1211 Encounter for screening for malignant neoplasm of colon: Secondary | ICD-10-CM | POA: Diagnosis not present

## 2020-06-18 MED ORDER — PLENVU 140 G PO SOLR: 1.0000 | ORAL | 0 refills | Status: DC

## 2020-06-18 NOTE — Progress Notes (Signed)
ASSESSMENT AND PLAN    # 50 yo female with episodic severe epigastric pain radiating through to back. Had cholecystectomy for symptomatic cholelithiasis a few years ago but episodes have continued to occur about once a year.  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 Her lipase was 158, minimal elevation in liver enzymes. All labs have since  normalized.  Biochemical pancreatitis? No bile duct dilation but query if passing small gallstones.  Also, she has had three CT scans for abdominal pain since 2013 and they all show prominent mesenteric / retroperitoneal lymph nodes suggestive of mesenteritis. Not sure if these are purely incidental findings or are a part of what ever process is causing her pain.  --We need better imaging. She has allergy to IV contrast. Will schedule for MRI / MRCP  # History of gastric sleeve  # Chronically alternatied bowels habits. Loose BMs a couple of times a week. Stool may be hard in between times --trial of daily Benefiber  # GERD with pyrosis once a week and occasional nocturnal regurgitation.  --anti-reflux measures discussed.  Symptoms not frequent enough to put on maintenance reflux medication  # Colon cancer screening.  --Patient will be scheduled for a colonoscopy. The risks and benefits of colonoscopy with possible polypectomy / biopsies were discussed and the patient agrees to proceed.    # PCOS, on aldactone    HISTORY OF PRESENT ILLNESS     Primary Gastroenterologist : new - Gerrit Heck, MD   50 yo female with PMH / [place] of PCOS, asthma, gastric sleeve, ADD, nephrolithiasis  Patient is new to the practice, referred by PCP for abdominal pain. Patient says that about once a year  for the last several years she has gotten these same episodes of severe epigastric pain radiating through to her back. Pain is like a severe contractions coming in waves and lasting about 20 minutes each time until she ends up in the ED.  She  had a cholecystectomy ~ 3 years. Path c/w with chronic cholecystitis and cholelithiasis. Despite cholecystectomy she has continued to have these episodes.  This most recent episode in January was the most severe one yet. Seen in ED 06/01/20, lipase was 150, AST 65, ALT 48. Bilirubin and alk phos were normal. CBC was normal. Non-contrast CT scan remarkable for a focal region of mid mesenteric hazy stranding with numerous shotty reactive appearing clustered mid mesenteric lymph nodes suggestive of mesenteritis.   Additionally patient also complains of chronic diarrhea for years. She describes her bowel habits as two loose BMs a week. In between times BMs can be normal or sometimes may be hard or she has no BM at all.  She previously tried Imodium but did not find it helpful.  She was prescribed Colestid but never started it.    Data Reviewed:   Jan 2022  Non-contrast CT scan MPRESSION: 1. Focal region of mid mesenteric hazy stranding with numerous shotty, reactive appearing clustered mid mesenteric lymph nodes suggestive of a mesenteritis. 2. Trace low-attenuation free fluid in the deep pelvis is a nonspecific finding and may be physiologic in a reproductive age female. 3. No other acute abnormalities in the abdomen or pelvis. 4. Suspect a dropped tubal ligation clip layering in the deep pelvis. 5. Punctate nonobstructing left nephrolithiasis. 6. Postsurgical changes from prior gastric sleeve. No gross Complications  Jan 7591 Non contrast CT scan for abdominal pain IMPRESSION: 1. 3 mm nonobstructing stone in the midpole left kidney. No  ureteral stone or obstruction. 2. Prominent non pathologically enlarged lymph nodes throughout the mesentery and retroperitoneum with hazy fat stranding in the mesentery. Changes are likely inflammatory, suggesting mesenteric adenitis or sclerosing mesenteritis. Early lymphoma would be a less likely consideration. 3. Postoperative stomach consistent with  gastric sleeve   March 2013 Non-contrast CT scan IMPRESSION:  1. No acute abnormality.  2. Nonobstructing left nephrolithiasis.  3. Stable prominent retroperitoneal and mesenteric lymph nodes.  4. IUD within the uterus.  5. 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 ADD (attention deficit disorder)   . Allergy   . Asthma   . Chicken pox   . Depression   . PCOS (polycystic ovarian syndrome)   . Renal disorder    kidney disease  . 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 Shingles      Past Surgical History:  Procedure Laterality Date  . BUNIONECTOMY    . LAPAROSCOPIC CHOLECYSTECTOMY SINGLE SITE WITH INTRAOPERATIVE CHOLANGIOGRAM N/A 06/14/2017   Procedure: LAPAROSCOPIC CHOLECYSTECTOMY SINGLE SITE;  Surgeon: Michael Boston, MD;  Location: [facility];  Service: General;  Laterality: N/A;  . LAPAROSCOPIC GASTRIC SLEEVE RESECTION  09/2013  . LASER ABLATION     Vascular  . TONSILLECTOMY    . WISDOM TOOTH EXTRACTION     Family History  Problem Relation Age of Onset  . Varicose Veins Mother   . Asthma Mother   . Varicose Veins Sister   . Birth defects Maternal Aunt        Cognitively Challenged  . Miscarriages / Stillbirths Maternal Aunt   . Varicose Veins Maternal Aunt   . Cancer Paternal Aunt        Melanoma  . Hearing loss Paternal Aunt   . Varicose Veins Paternal Aunt   . Arthritis Maternal Grandmother   . Arthritis Paternal Grandfather   . Hearing loss Paternal Grandfather    Social History   Tobacco Use  . Smoking status: Never Smoker  . Smokeless tobacco: Never Used  Vaping Use  . Vaping Use: Never used  Substance Use Topics  . Alcohol use: Yes    Alcohol/week: 0.0 standard drinks    Comment: occasional  . Drug use: No   Current Outpatient Medications  Medication Sig Dispense Refill  . clindamycin (CLEOCIN T) 1  % lotion Apply 1 application topically once a week.    . cyclobenzaprine (FLEXERIL) 10 MG tablet Take 10 mg by mouth daily as needed for muscle spasms.     . Multiple Vitamin (MULTIVITAMIN) tablet Take 1 tablet by mouth daily.    . ondansetron (ZOFRAN ODT) 4 MG disintegrating tablet Take 1 tablet (4 mg total) by mouth every 8 (eight) hours as needed for nausea. 15 tablet 2  . spironolactone (ALDACTONE) 100 MG tablet Take 200 mg by mouth daily.     . traZODone (DESYREL) 100 MG tablet Take 200 mg by mouth at bedtime.     . tretinoin (RETIN-A) 0.025 % cream Apply 1 application topically daily as needed.    . triamcinolone (NASACORT) 55 MCG/ACT AERO nasal inhaler Place 1 spray into the nose daily.    Marland Kitchen albuterol (VENTOLIN HFA) 108 (90 Base) MCG/ACT inhaler Inhale into the lungs as needed. (Patient not taking: Reported on 06/18/2020)    . EPINEPHrine 0.3 mg/0.3 mL IJ SOAJ injection Inject 0.3 mLs into the skin daily  as needed (allergic reactions).  (Patient not taking: Reported on 06/18/2020)     No current facility-administered medications for this visit.   Allergies  Allergen Reactions  . Contrast Media [Iodinated Diagnostic Agents]   . Iohexol      Code: HIVES, Desc: 1 hive developed on hand post injection of 125cc's Omni 300., Onset Date: [id number]   . Penicillins Other (See Comments)    Has patient had a PCN reaction causing immediate rash, facial/tongue/throat swelling, SOB or lightheadedness with hypotension: Yes Has patient had a PCN reaction causing severe rash involving mucus membranes or skin necrosis: No Has patient had a PCN reaction that required hospitalization: No Has patient had a PCN reaction occurring within the last 10 years: No If all of the above answers are "NO", then may proceed with Cephalosporin use.  . Gadolinium Derivatives Hives, Itching and Rash     Review of Systems: Positive for allergy, sinus trouble, anxiety, vision changes, fatigue, headaches, menstrual pain,  muscle cramps and pains, skin rash, sleeping problems..  All other systems reviewed and negative except where noted in HPI.   PHYSICAL EXAM :    Wt Readings from Last 3 Encounters:  06/18/20 163 lb 8 oz (74.2 kg)  06/01/20 162 lb 11.2 oz (73.8 kg)  03/02/19 171 lb (77.6 kg)    Ht 5' 5" (1.651 m) Comment: height measured without shoes  Wt 163 lb 8 oz (74.2 kg)   BMI 27.21 kg/m  Constitutional:  Pleasant female in no acute distress. Psychiatric: Normal mood and affect. Behavior is normal. EENT: Pupils normal.  Conjunctivae are normal. No scleral icterus. Neck supple.  Cardiovascular: Normal rate, regular rhythm. No edema Pulmonary/chest: Effort normal and breath sounds normal. No wheezing, rales or rhonchi. Abdominal: Soft, nondistended, nontender. Bowel sounds active throughout. There are no masses palpable. No hepatomegaly. Neurological: Alert and oriented to person place and time. Skin: Skin is warm and dry. No rashes noted.  Tye Savoy, NP  06/18/2020, 10:36 AM  Cc:  Referring Provider Panosh, Standley Brooking, MD

## 2020-06-18 NOTE — Patient Instructions (Signed)
You have been scheduled for an MRI at [facility]  on 07/09/20. Your appointment time is 9:30am. Please arrive 15 minutes prior to your appointment time for registration purposes. Please make certain not to have anything to eat or drink 4 hours prior to your test. In addition, if you have any metal in your body, have a pacemaker or defibrillator, please be sure to let your ordering physician know. This test typically takes 45 minutes to 1 hour to complete. Should you need to reschedule, please call [phone] to do so.  If you are age 67 or younger, your body mass index should be between 19-25. Your Body mass index is 27.21 kg/m. If this is out of the aformentioned range listed, please consider follow up with your Primary Care Provider.   START: Benefiber  Once daily.   You have been scheduled for a colonoscopy. Please follow written instructions given to you at your visit today.  Please pick up your prep supplies at the pharmacy within the next 1-3 days. If you use inhalers (even only as needed), please bring them with you on the day of your procedure.  We have sent the following medications to your pharmacy for you to pick up at your convenience: Plenuv    Thank you for choosing me and [facility].  Tye Savoy NP

## 2020-06-26 NOTE — Progress Notes (Signed)
Agree with the assessment and plan as outlined by Tye Savoy, NP.  History reviewed and also reviewed findings from her previous CT scans.  Interesting that she has prominent abdominal lymph nodes on each of the scans dating back to 2013.  Curious about sclerosing mesenteritis, which can occur in patients with previous abdominal surgeries (she has history of ccy, gastric sleeve), but she is otherwise without pathognomonic radiographic findings such as pseudocapsule, small bowel mass lesion, fat ring.  Possible this is more "misty mesentery".  While MRI/MRCP would be helpful from the pancreatic/biliary work-up, this may not shed any light on the CT findings.  Could check inflammatory markers during an acute episode.  Thankfully the lymph nodes otherwise appear stable on nearly 9 years of imaging, which should provide some level of reassurance this should not be a malignant etiology.  However, if continued concern for malignant origin, there are some data to support PET/CT (single center small study and anecdotal data).  Finally, most aggressive work-up would include referral to surgery for laparoscopy and lymph node biopsy.  Can discuss more at follow-up.  Otherwise scheduled for colonoscopy for routine colon cancer screening.  Gillis Boardley, DO, [facility]

## 2020-06-26 NOTE — Progress Notes (Signed)
Duplicate

## 2020-07-09 ENCOUNTER — Ambulatory Visit (HOSPITAL_COMMUNITY)
Admission: RE | Admit: 2020-07-09 | Discharge: 2020-07-09 | Disposition: A | Discharge status: Home / Self Care | Payer: No Typology Code available for payment source | Source: Ambulatory Visit | Attending: Nurse Practitioner | Admitting: Nurse Practitioner

## 2020-07-09 ENCOUNTER — Other Ambulatory Visit: Payer: Self-pay | Admitting: Nurse Practitioner

## 2020-07-09 ENCOUNTER — Encounter (HOSPITAL_COMMUNITY): Payer: Self-pay

## 2020-07-09 ENCOUNTER — Other Ambulatory Visit: Payer: Self-pay

## 2020-07-09 ENCOUNTER — Ambulatory Visit (HOSPITAL_COMMUNITY): Admission: RE | Admit: 2020-07-09 | Payer: No Typology Code available for payment source | Source: Ambulatory Visit

## 2020-07-09 DIAGNOSIS — R194 Change in bowel habit: Secondary | ICD-10-CM | POA: Insufficient documentation

## 2020-07-09 DIAGNOSIS — R1013 Epigastric pain: Secondary | ICD-10-CM | POA: Diagnosis present

## 2020-07-09 DIAGNOSIS — Z1211 Encounter for screening for malignant neoplasm of colon: Secondary | ICD-10-CM

## 2020-07-17 ENCOUNTER — Encounter: Payer: Self-pay | Admitting: Gastroenterology

## 2020-07-17 ENCOUNTER — Other Ambulatory Visit: Payer: Self-pay

## 2020-07-17 ENCOUNTER — Ambulatory Visit (AMBULATORY_SURGERY_CENTER): Payer: No Typology Code available for payment source | Admitting: Gastroenterology

## 2020-07-17 VITALS — BP 100/66 | HR 2 | Temp 96.7°F | Resp 13 | Ht 65.0 in | Wt 163.0 lb

## 2020-07-17 DIAGNOSIS — R197 Diarrhea, unspecified: Secondary | ICD-10-CM

## 2020-07-17 DIAGNOSIS — K635 Polyp of colon: Secondary | ICD-10-CM

## 2020-07-17 DIAGNOSIS — R194 Change in bowel habit: Secondary | ICD-10-CM | POA: Diagnosis not present

## 2020-07-17 DIAGNOSIS — K640 First degree hemorrhoids: Secondary | ICD-10-CM | POA: Diagnosis not present

## 2020-07-17 DIAGNOSIS — Z1211 Encounter for screening for malignant neoplasm of colon: Secondary | ICD-10-CM

## 2020-07-17 DIAGNOSIS — D125 Benign neoplasm of sigmoid colon: Secondary | ICD-10-CM

## 2020-07-17 MED ORDER — SODIUM CHLORIDE 0.9 % IV SOLN: 500.0000 mL | Freq: Once | INTRAVENOUS | Status: DC

## 2020-07-17 NOTE — Op Note (Signed)
[place] Patient Name: Jordan Mayer Procedure Date: 07/17/2020 11:11 AM MRN: [id number] Endoscopist: Gerrit Heck , MD Age: 50 Referring MD:  Date of Birth: 1971-05-13 Gender: Female Account #: [id number] Procedure:                Colonoscopy Indications:              Screening for colorectal malignant neoplasm, This                            is the patient's first colonoscopy                           Incidentally, also with recent change in bowel                            habits, characterized by loose, non-bloody stools. Medicines:                Monitored Anesthesia Care Procedure:                Pre-Anesthesia Assessment:                           - Prior to the procedure, a History and Physical                            was performed, and patient medications and                            allergies were reviewed. The patient's tolerance of                            previous anesthesia was also reviewed. The risks                            and benefits of the procedure and the sedation                            options and risks were discussed with the patient.                            All questions were answered, and informed consent                            was obtained. Prior Anticoagulants: The patient has                            taken no previous anticoagulant or antiplatelet                            agents. ASA Grade Assessment: II - A patient with                            mild systemic disease. After reviewing the risks  and benefits, the patient was deemed in                            satisfactory condition to undergo the procedure.                           After obtaining informed consent, the colonoscope                            was passed under direct vision. Throughout the                            procedure, the patient's blood pressure, pulse, and                            oxygen saturations were  monitored continuously. The                            Colonoscope was introduced through the anus and                            advanced to the the terminal ileum. The colonoscopy                            was performed without difficulty. The patient                            tolerated the procedure well. The quality of the                            bowel preparation was good. The terminal ileum,                            ileocecal valve, appendiceal orifice, and rectum                            were photographed. Scope In: 11:23:03 AM Scope Out: 11:41:57 AM Scope Withdrawal Time: 0 hours 14 minutes 54 seconds  Total Procedure Duration: 0 hours 18 minutes 54 seconds  Findings:                 The perianal and digital rectal examinations were                            normal.                           A 2 mm polyp was found in the sigmoid colon. The                            polyp was sessile. The polyp was removed with a                            cold biopsy forceps. Resection and retrieval were  complete. Estimated blood loss was minimal.                           Normal mucosa was found in the entire colon.                            Biopsies for histology were taken with a cold                            forceps from the right colon and left colon for                            evaluation of microscopic colitis. Estimated blood                            loss was minimal.                           Non-bleeding internal hemorrhoids were found during                            retroflexion. The hemorrhoids were small and Grade                            I (internal hemorrhoids that do not prolapse).                           The terminal ileum appeared normal. Complications:            No immediate complications. Estimated Blood Loss:     Estimated blood loss was minimal. Impression:               - One 2 mm polyp in the sigmoid colon, removed with                             a cold biopsy forceps. Resected and retrieved.                           - Normal mucosa in the entire examined colon.                            Biopsied.                           - Non-bleeding internal hemorrhoids.                           - The examined portion of the ileum was normal. Recommendation:           - Patient has a contact number available for                            emergencies. The signs and symptoms of potential  delayed complications were discussed with the                            patient. Return to normal activities tomorrow.                            Written discharge instructions were provided to the                            patient.                           - Resume previous diet.                           - Continue present medications.                           - Await pathology results.                           - Repeat colonoscopy for surveillance based on                            pathology results.                           - Return to GI office at appointment to be                            scheduled. Gerrit Heck, MD 07/17/2020 11:45:50 AM

## 2020-07-17 NOTE — Progress Notes (Signed)
A/ox3, pleased with MAC, report to RN 

## 2020-07-17 NOTE — Patient Instructions (Signed)
Thank you for letting us take care of your healthcare needs today. Please see handouts given to you on Polyps and Hemorrhoids.     YOU HAD AN ENDOSCOPIC PROCEDURE TODAY AT THE [facility]:   Refer to the procedure report that was given to you for any specific questions about what was found during the examination.  If the procedure report does not answer your questions, please call your gastroenterologist to clarify.  If you requested that your care partner not be given the details of your procedure findings, then the procedure report has been included in a sealed envelope for you to review at your convenience later.  YOU SHOULD EXPECT: Some feelings of bloating in the abdomen. Passage of more gas than usual.  Walking can help get rid of the air that was put into your GI tract during the procedure and reduce the bloating. If you had a lower endoscopy (such as a colonoscopy or flexible sigmoidoscopy) you may notice spotting of blood in your stool or on the toilet paper. If you underwent a bowel prep for your procedure, you may not have a normal bowel movement for a few days.  Please Note:  You might notice some irritation and congestion in your nose or some drainage.  This is from the oxygen used during your procedure.  There is no need for concern and it should clear up in a day or so.  SYMPTOMS TO REPORT IMMEDIATELY:  Following lower endoscopy (colonoscopy or flexible sigmoidoscopy):  Excessive amounts of blood in the stool  Significant tenderness or worsening of abdominal pains  Swelling of the abdomen that is new, acute  Fever of 100F or higher  For urgent or emergent issues, a gastroenterologist can be reached at any hour by calling [phone]. Do not use MyChart messaging for urgent concerns.    DIET:  We do recommend a small meal at first, but then you may proceed to your regular diet.  Drink plenty of fluids but you should avoid alcoholic beverages for 24  hours.  ACTIVITY:  You should plan to take it easy for the rest of today and you should NOT DRIVE or use heavy machinery until tomorrow (because of the sedation medicines used during the test).    FOLLOW UP: Our staff will call the number listed on your records 48-72 hours following your procedure to check on you and address any questions or concerns that you may have regarding the information given to you following your procedure. If we do not reach you, we will leave a message.  We will attempt to reach you two times.  During this call, we will ask if you have developed any symptoms of COVID 19. If you develop any symptoms (ie: fever, flu-like symptoms, shortness of breath, cough etc.) before then, please call [phone].  If you test positive for Covid 19 in the 2 weeks post procedure, please call and report this information to us.    If any biopsies were taken you will be contacted by phone or by letter within the next 1-3 weeks.  Please call us at [phone] if you have not heard about the biopsies in 3 weeks.    SIGNATURES/CONFIDENTIALITY: You and/or your care partner have signed paperwork which will be entered into your electronic medical record.  These signatures attest to the fact that that the information above on your After Visit Summary has been reviewed and is understood.  Full responsibility of the confidentiality of this discharge information   lies with you and/or your care-partner.  

## 2020-07-17 NOTE — Progress Notes (Signed)
Called to room to assist during endoscopic procedure.  Patient ID and intended procedure confirmed with present staff. Received instructions for my participation in the procedure from the performing physician.  

## 2020-07-17 NOTE — Progress Notes (Signed)
Vitals-SDH

## 2020-07-21 ENCOUNTER — Telehealth: Payer: Self-pay

## 2020-07-21 NOTE — Telephone Encounter (Signed)
LVM

## 2020-07-30 ENCOUNTER — Encounter: Payer: Self-pay | Admitting: Gastroenterology

## 2020-08-04 LAB — HM MAMMOGRAPHY

## 2020-08-04 LAB — HM PAP SMEAR

## 2020-08-06 ENCOUNTER — Encounter: Payer: Self-pay | Admitting: Internal Medicine

## 2020-08-27 ENCOUNTER — Telehealth: Payer: Self-pay | Admitting: Nurse Practitioner

## 2020-08-27 NOTE — Telephone Encounter (Signed)
Inbound call from patient inquiring about further treatment with the discomfort , diarrhea, bloating, etc to figure out what is wrong. States is the same as her office visit 06/18/20. Best contact # [phone]

## 2020-08-28 MED ORDER — DICYCLOMINE HCL 10 MG PO CAPS: 10.0000 mg | ORAL_CAPSULE | Freq: Three times a day (TID) | ORAL | 0 refills | Status: DC

## 2020-08-28 NOTE — Telephone Encounter (Signed)
Lm on vm for patient to return call 

## 2020-08-28 NOTE — Telephone Encounter (Signed)
Dr. Lyndel Safe, as DOD this morning. Patient saw Nevin Bloodgood in February for epigastric pain, change in bowel habits, abnormal CT findings. Colonoscopy with Dr. Bryan Lemma on 07/17/20.  Spoke with patient, she states that she is still having the same issues. Reports epigastric pain, either diarrhea or constipation, if she is having diarrhea she will have about 8-12 episodes a day. Reports bloating and passing a lot of gas. Patient states that if she is having gas pains she will use Pepcid. Denies any fevers. She states that she had 2 bowel movements today and is taking a lot of Senakot. Patient has not heard from genetics and would like to know what the next steps in her care are. Please advise, thank you.

## 2020-08-28 NOTE — Telephone Encounter (Signed)
Spoke with patient in regards to recommendations. Patient has been scheduled for a virtual pre-visit on Friday, 09/19/20 at 8 AM. Patient is scheduled for an EGD with Dr. Bryan Lemma on Wednesday, 10/01/20 at 10 AM, she is aware that she will need to arrive at 9 AM with a care partner. Patient has been advised to take prescription in the interim. Patient has been advised that if she is having diarrhea to stop Senokot but can resume it if she is constipated. Patient verbalized understanding of all information and had no concerns at the end of the call.   Prescription sent to pharmacy on file, pt confirmed.

## 2020-08-28 NOTE — Telephone Encounter (Signed)
DOD  Have reviewed previous GI notes CT reports Negative recent colonoscopy with bx  Suggest -Proceed with EGD with SB Bx (for epigastric pain)-first available slot -She is having diarrhea.  Recommend to stop Senokot. -Looks like she also has IBS.  Lets give her a trial of Bentyl 10 mg p.o. BID, half an hour before meals and at bedtime  RG

## 2020-09-19 ENCOUNTER — Other Ambulatory Visit: Payer: Self-pay

## 2020-09-19 ENCOUNTER — Other Ambulatory Visit: Payer: Self-pay | Admitting: Gastroenterology

## 2020-09-19 ENCOUNTER — Ambulatory Visit (AMBULATORY_SURGERY_CENTER): Payer: No Typology Code available for payment source | Admitting: *Deleted

## 2020-09-19 VITALS — Ht 65.0 in | Wt 160.0 lb

## 2020-09-19 DIAGNOSIS — R1013 Epigastric pain: Secondary | ICD-10-CM

## 2020-09-19 NOTE — Progress Notes (Signed)
Pt verified name, DOB, address and insurance during PV today. Pt mailed instruction packet to included paper to complete and mail back to [facility] with addressed and stamped envelope, Emmi video, copy of consent form to read and not return, and instructions.  PV completed over the phone. Pt encouraged to call with questions or issues.  My Chart instructions to pt as well    No loose or missing teeth- pt denies bonded teeth- no dentures   No egg or soy allergy known to patient  No issues with past sedation with any surgeries or procedures Patient denies ever being told they had issues or difficulty with intubation  No FH of Malignant Hyperthermia No diet pills per patient No home 02 use per patient  No blood thinners per patient  Pt denies issues with constipation  No A fib or A flutter  EMMI video to pt or via [place] 19 guidelines implemented in PV today with Pt and RN  Pt is fully vaccinated  for Covid   Due to the COVID-19 pandemic we are asking patients to follow certain guidelines.  Pt aware of COVID protocols and [facility] guidelines

## 2020-10-01 ENCOUNTER — Encounter: Payer: Self-pay | Admitting: Gastroenterology

## 2020-10-01 ENCOUNTER — Ambulatory Visit (AMBULATORY_SURGERY_CENTER): Payer: No Typology Code available for payment source | Admitting: Gastroenterology

## 2020-10-01 ENCOUNTER — Other Ambulatory Visit: Payer: Self-pay

## 2020-10-01 VITALS — BP 86/57 | HR 64 | Temp 98.0°F | Resp 8 | Ht 65.0 in | Wt 160.0 lb

## 2020-10-01 DIAGNOSIS — K2950 Unspecified chronic gastritis without bleeding: Secondary | ICD-10-CM

## 2020-10-01 DIAGNOSIS — R1013 Epigastric pain: Secondary | ICD-10-CM

## 2020-10-01 DIAGNOSIS — K2990 Gastroduodenitis, unspecified, without bleeding: Secondary | ICD-10-CM

## 2020-10-01 DIAGNOSIS — K2100 Gastro-esophageal reflux disease with esophagitis, without bleeding: Secondary | ICD-10-CM

## 2020-10-01 DIAGNOSIS — K2970 Gastritis, unspecified, without bleeding: Secondary | ICD-10-CM | POA: Diagnosis present

## 2020-10-01 DIAGNOSIS — R194 Change in bowel habit: Secondary | ICD-10-CM

## 2020-10-01 MED ORDER — PANTOPRAZOLE SODIUM 20 MG PO TBEC: 20.0000 mg | DELAYED_RELEASE_TABLET | Freq: Two times a day (BID) | ORAL | 1 refills | Status: DC

## 2020-10-01 MED ORDER — SODIUM CHLORIDE 0.9 % IV SOLN: 500.0000 mL | Freq: Once | INTRAVENOUS | Status: DC

## 2020-10-01 NOTE — Progress Notes (Signed)
Pt's states no medical or surgical changes since previsit or office visit. 

## 2020-10-01 NOTE — Patient Instructions (Signed)
YOU HAD AN ENDOSCOPIC PROCEDURE TODAY AT THE [facility]:   Refer to the procedure report that was given to you for any specific questions about what was found during the examination.  If the procedure report does not answer your questions, please call your gastroenterologist to clarify.  If you requested that your care partner not be given the details of your procedure findings, then the procedure report has been included in a sealed envelope for you to review at your convenience later.  YOU SHOULD EXPECT: Some feelings of bloating in the abdomen. Passage of more gas than usual.  Walking can help get rid of the air that was put into your GI tract during the procedure and reduce the bloating. If you had a lower endoscopy (such as a colonoscopy or flexible sigmoidoscopy) you may notice spotting of blood in your stool or on the toilet paper. If you underwent a bowel prep for your procedure, you may not have a normal bowel movement for a few days.  Please Note:  You might notice some irritation and congestion in your nose or some drainage.  This is from the oxygen used during your procedure.  There is no need for concern and it should clear up in a day or so.  SYMPTOMS TO REPORT IMMEDIATELY:   Following lower endoscopy (colonoscopy or flexible sigmoidoscopy):  Excessive amounts of blood in the stool  Significant tenderness or worsening of abdominal pains  Swelling of the abdomen that is new, acute  Fever of 100F or higher   Following upper endoscopy (EGD)  Vomiting of blood or coffee ground material  New chest pain or pain under the shoulder blades  Painful or persistently difficult swallowing  New shortness of breath  Fever of 100F or higher  Black, tarry-looking stools  For urgent or emergent issues, a gastroenterologist can be reached at any hour by calling [phone]. Do not use MyChart messaging for urgent concerns.    DIET:  We do recommend a small meal at first, but  then you may proceed to your regular diet.  Drink plenty of fluids but you should avoid alcoholic beverages for 24 hours.  ACTIVITY:  You should plan to take it easy for the rest of today and you should NOT DRIVE or use heavy machinery until tomorrow (because of the sedation medicines used during the test).    FOLLOW UP: Our staff will call the number listed on your records 48-72 hours following your procedure to check on you and address any questions or concerns that you may have regarding the information given to you following your procedure. If we do not reach you, we will leave a message.  We will attempt to reach you two times.  During this call, we will ask if you have developed any symptoms of COVID 19. If you develop any symptoms (ie: fever, flu-like symptoms, shortness of breath, cough etc.) before then, please call [phone].  If you test positive for Covid 19 in the 2 weeks post procedure, please call and report this information to us.    If any biopsies were taken you will be contacted by phone or by letter within the next 1-3 weeks.  Please call us at [phone] if you have not heard about the biopsies in 3 weeks.    SIGNATURES/CONFIDENTIALITY: You and/or your care partner have signed paperwork which will be entered into your electronic medical record.  These signatures attest to the fact that that the information above on   your After Visit Summary has been reviewed and is understood.  Full responsibility of the confidentiality of this discharge information lies with you and/or your care-partner. 

## 2020-10-01 NOTE — Progress Notes (Signed)
PT taken to PACU. Monitors in place. VSS. Report given to RN. 

## 2020-10-01 NOTE — Op Note (Signed)
[facility] Patient Name: Jordan Mayer Procedure Date: 10/01/2020 10:02 AM MRN: [id number] Endoscopist: Gerrit Heck , MD Age: 50 Referring MD:  Date of Birth: Jan 01, 1971 Gender: Female Account #: [id number] Procedure:                Upper GI endoscopy Indications:              Epigastric abdominal pain, Abdominal pain in the                            right upper quadrant, Esophageal reflux, Diarrhea Medicines:                Monitored Anesthesia Care Procedure:                Pre-Anesthesia Assessment:                           - Prior to the procedure, a History and Physical                            was performed, and patient medications and                            allergies were reviewed. The patient's tolerance of                            previous anesthesia was also reviewed. The risks                            and benefits of the procedure and the sedation                            options and risks were discussed with the patient.                            All questions were answered, and informed consent                            was obtained. Prior Anticoagulants: The patient has                            taken no previous anticoagulant or antiplatelet                            agents. ASA Grade Assessment: II - A patient with                            mild systemic disease. After reviewing the risks                            and benefits, the patient was deemed in                            satisfactory condition to undergo the procedure.  After obtaining informed consent, the endoscope was                            passed under direct vision. Throughout the                            procedure, the patient's blood pressure, pulse, and                            oxygen saturations were monitored continuously. The                            Endoscope was introduced through the mouth, and                             advanced to the second part of duodenum. The upper                            GI endoscopy was accomplished without difficulty.                            The patient tolerated the procedure well. Scope In: Scope Out: Findings:                 LA Grade A (one or more mucosal breaks less than 5                            mm, not extending between tops of 2 mucosal folds)                            esophagitis with no bleeding was found 38 cm from                            the incisors. There was LES laxity with a Hill                            Grade 3 valve noted on retroflexed views.                           Evidence of a sleeve gastrectomy was found in the                            gastric fundus and in the gastric body. This was                            characterized by healthy appearing mucosa.                           Scattered mild inflammation characterized by                            erythema was found in the gastric body and in the  gastric antrum. Biopsies were taken with a cold                            forceps for Helicobacter pylori testing. Estimated                            blood loss was minimal.                           The duodenal bulb, first portion of the duodenum                            and second portion of the duodenum were normal.                            Biopsies were taken with a cold forceps for                            histology. Estimated blood loss was minimal. Complications:            No immediate complications. Estimated Blood Loss:     Estimated blood loss was minimal. Impression:               - LA Grade A reflux esophagitis with no bleeding.                           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                           - A sleeve gastrectomy was found, characterized by                            healthy appearing mucosa.                            - Gastritis. Biopsied.                           - Normal duodenal bulb, first portion of the                            duodenum and second portion of the duodenum.                            Biopsied. Recommendation:           - Patient has a contact number available for                            emergencies. The signs and symptoms of potential                            delayed complications were discussed  with the                            patient. Return to normal activities tomorrow.                            Written discharge instructions were provided to the                            patient.                           - Resume previous diet.                           - Await pathology results.                           - Use Protonix (pantoprazole) 20 mg PO BID for 6                            weeks to promote mucosal healing, then reduce to                            the lowest effective dose to control reflux.                           - Return to GI clinic in 2-3 months at appointment                            to be scheduled. Gerrit Heck, MD 10/01/2020 10:35:46 AM

## 2020-10-03 ENCOUNTER — Telehealth: Payer: Self-pay | Admitting: *Deleted

## 2020-10-03 ENCOUNTER — Telehealth: Payer: Self-pay | Admitting: Internal Medicine

## 2020-10-03 NOTE — Telephone Encounter (Signed)
  Follow up Call-  Call back number 10/01/2020 07/17/2020  Post procedure Call Back phone  # [phone]  Permission to leave phone message Yes Yes  Some recent data might be hidden    LMOM to call back with any questions or concerns.  Also, call back if patient has developed fever, respiratory issues or been dx with COVID or had any family members or close contacts diagnosed since her procedure.

## 2020-10-03 NOTE — Telephone Encounter (Signed)
Left message on f/u call 

## 2020-10-03 NOTE — Telephone Encounter (Signed)
Patient had EGD 5/18.  She has a normal postoperative sleeve gastrectomy anatomy.  Biopsies of the stomach were taken.  Today she started developing left upper quadrant pain intensifying it is moderate to some severe at times and cramping.  This is similar to pain that she has had off and on over the past several months.  She has been in and out of the ED without clear identification of a problem.  She has had some mesenteric stranding at 1 point lipase was elevated.  CT and MRI have been unrevealing.  I do not think this is a complication of her EGD and she understands and agrees and thinks it is probably a recurrence of what ever process has been going on.  Recommendation is for her to be on clear liquids take analgesics i.e. not nonsteroidal she has some narcotics left from previous ER visits.  Call back or present to ED if this intensifies and is severe and is accompanied by other problems otherwise stay on clears and advance diet as tolerated and we will contact her on Monday 522.

## 2020-10-07 ENCOUNTER — Encounter: Payer: Self-pay | Admitting: Gastroenterology

## 2020-10-07 NOTE — Telephone Encounter (Signed)
Spoke with patient in regards to pathology results and recommendations. She is aware that she will be receiving a letter via My Chart as well with the results. Patient verbalized understanding and had no concerns at the end of the call.

## 2020-10-07 NOTE — Telephone Encounter (Signed)
Spoke with patient, she states that she is doing "fine", reports some discomfort but not sharp pain like she had over the weekend. She states that the pain is not localized on her left side anymore. She describes as a dull discomfort. No fever. She states that the sharp pain that she had over the weekend scared her. She is also inquiring about pathology results. Please advise, thanks.

## 2020-10-07 NOTE — Telephone Encounter (Signed)
Can we please call to check in on Jordan Mayer and see how she is feeling? Thanks.

## 2020-10-23 ENCOUNTER — Other Ambulatory Visit: Payer: Self-pay | Admitting: General Surgery

## 2020-10-23 ENCOUNTER — Other Ambulatory Visit: Payer: Self-pay | Admitting: Gastroenterology

## 2020-10-23 ENCOUNTER — Telehealth: Payer: Self-pay | Admitting: Gastroenterology

## 2020-10-23 DIAGNOSIS — K2990 Gastroduodenitis, unspecified, without bleeding: Secondary | ICD-10-CM

## 2020-10-23 DIAGNOSIS — K2100 Gastro-esophageal reflux disease with esophagitis, without bleeding: Secondary | ICD-10-CM

## 2020-10-23 MED ORDER — DICYCLOMINE HCL 10 MG PO CAPS: 10.0000 mg | ORAL_CAPSULE | Freq: Three times a day (TID) | ORAL | 0 refills | Status: DC

## 2020-10-23 NOTE — Telephone Encounter (Signed)
Inbound call from patient requesting additional refills for dicyclomine medication be sent to pharmacy in chart please.

## 2020-11-14 ENCOUNTER — Other Ambulatory Visit: Payer: Self-pay | Admitting: Gastroenterology

## 2020-11-16 ENCOUNTER — Other Ambulatory Visit: Payer: Self-pay | Admitting: Gastroenterology

## 2020-11-16 DIAGNOSIS — K2970 Gastritis, unspecified, without bleeding: Secondary | ICD-10-CM

## 2020-11-16 DIAGNOSIS — K2100 Gastro-esophageal reflux disease with esophagitis, without bleeding: Secondary | ICD-10-CM

## 2020-12-19 ENCOUNTER — Other Ambulatory Visit: Payer: Self-pay | Admitting: Gastroenterology

## 2020-12-19 DIAGNOSIS — K2970 Gastritis, unspecified, without bleeding: Secondary | ICD-10-CM

## 2020-12-19 DIAGNOSIS — K2100 Gastro-esophageal reflux disease with esophagitis, without bleeding: Secondary | ICD-10-CM

## 2021-01-10 ENCOUNTER — Other Ambulatory Visit: Payer: Self-pay | Admitting: Gastroenterology

## 2021-02-11 ENCOUNTER — Encounter: Payer: Self-pay | Admitting: Internal Medicine

## 2021-02-11 ENCOUNTER — Other Ambulatory Visit: Payer: Self-pay

## 2021-02-11 ENCOUNTER — Ambulatory Visit: Payer: No Typology Code available for payment source | Admitting: Internal Medicine

## 2021-02-11 VITALS — BP 110/70 | HR 82 | Temp 98.5°F | Wt 167.3 lb

## 2021-02-11 DIAGNOSIS — G43909 Migraine, unspecified, not intractable, without status migrainosus: Secondary | ICD-10-CM | POA: Diagnosis not present

## 2021-02-11 MED ORDER — SUMATRIPTAN SUCCINATE 50 MG PO TABS: 50.0000 mg | ORAL_TABLET | ORAL | 0 refills | Status: DC | PRN

## 2021-02-11 MED ORDER — PROMETHAZINE HCL 25 MG PO TABS
25.0000 mg | ORAL_TABLET | Freq: Three times a day (TID) | ORAL | 0 refills | Status: DC | PRN
Start: 2021-02-11 — End: 2021-11-05

## 2021-02-11 NOTE — Progress Notes (Signed)
Acute office Visit     This visit occurred during the SARS-CoV-2 public health emergency.  Safety protocols were in place, including screening questions prior to the visit, additional usage of staff PPE, and extensive cleaning of exam room while observing appropriate contact time as indicated for disinfecting solutions.    CC/Reason for Visit: Migraine headache  HPI: Jordan Mayer is a 50 y.o. female who is coming in today for the above mentioned reasons.  She is here today for evaluation of migraine headache.  She has extreme nausea and photophobia.  She is wearing sunglasses in a darkened room.  Barometric pressure is a big trigger for her, change in temperature tends to trigger her migraines.  Migraine started this morning around 3 AM.  She has only taken 2 Tylenol without relief.  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 She has never used smokeless tobacco. She reports current alcohol use. She reports that she does not use drugs.  Allergies: Allergies  Allergen Reactions   Contrast Media  [Iodinated Diagnostic Agents]    Iohexol      Code: HIVES, Desc: 1 hive developed on hand post injection of 125cc's Omni 300., Onset Date: [id number]    Penicillins Other (See Comments)    Has patient had a PCN reaction causing immediate rash, facial/tongue/throat swelling, SOB or lightheadedness with hypotension: Yes Has patient had a PCN reaction causing severe rash involving mucus membranes or skin necrosis: No Has patient had a PCN reaction that required hospitalization: No Has patient had a PCN reaction occurring within the last 10 years: No If all of the above answers are "NO", then may proceed with Cephalosporin use.   Gadolinium Derivatives Hives, Itching and Rash    Family History: \ Family History  Problem Relation Age of Onset   Varicose Veins Mother    Asthma Mother    Varicose Veins Sister    Birth defects Paternal Aunt        Cognitively Challenged   Miscarriages / Stillbirths Paternal Aunt    Varicose Veins Paternal Aunt    Varicose Veins Paternal Aunt    Melanoma Paternal Aunt    Arthritis Maternal Grandmother    Arthritis Paternal Grandfather    Hearing loss Paternal Grandfather    Colon cancer Neg Hx    Colon polyps Neg Hx    Esophageal cancer Neg Hx    Rectal cancer Neg  Hx    Stomach cancer Neg Hx      Current Outpatient Medications:    albuterol (VENTOLIN HFA) 108 (90 Base) MCG/ACT inhaler, Inhale into the lungs as needed., Disp: , Rfl:    clindamycin (CLEOCIN T) 1 % lotion, Apply 1 application topically once a week., Disp: , Rfl:    colestipol (COLESTID) 1 g tablet, colestipol 1 gram tablet  TAKE 1 TABLET (1 G TOTAL) BY MOUTH 2 (TWO) TIMES DAILY. FOR DIARRHEA, Disp: , Rfl:    cyclobenzaprine (FLEXERIL) 10 MG tablet, Take 10 mg by mouth daily as needed for muscle spasms., Disp: , Rfl:    dicyclomine (BENTYL) 10 MG capsule, TAKE 1 CAPS 4 TIMES DAILY - BEFORE MEALS AND AT BEDTIME. TAKE 30 MINUTES BEFORE MEALS AND AT BED, Disp: 120 capsule, Rfl: 0    EPINEPHrine 0.3 mg/0.3 mL IJ SOAJ injection, Inject 0.3 mLs into the skin daily as needed (allergic reactions)., Disp: , Rfl:    EVEKEO 10 MG TABS, Take by mouth. Take 2 tablets in the morning and 1 in the afternoon, Disp: , Rfl:    [name] 24 FE 1-20 MG-MCG(24) tablet, Take 1 tablet by mouth daily., Disp: , Rfl:    L-Methylfolate-Algae (DEPLIN 15 PO), Take 1 tablet by mouth daily., Disp: , Rfl:    Multiple Vitamin (MULTIVITAMIN) tablet, Take 1 tablet by mouth daily., Disp: , Rfl:    ondansetron (ZOFRAN ODT) 4 MG disintegrating tablet, Take 1 tablet (4 mg total) by mouth every 8 (eight) hours as needed for nausea., Disp: 15 tablet, Rfl: 2   pantoprazole (PROTONIX) 20 MG tablet, Take 1 tablet (20 mg total) by mouth daily., Disp: 30 tablet, Rfl: 2   promethazine (PHENERGAN) 25 MG tablet, Take 1 tablet (25 mg total) by mouth every 8 (eight) hours as needed for nausea or vomiting., Disp: 20 tablet, Rfl: 0   spironolactone (ALDACTONE) 100 MG tablet, Take 100 mg by mouth 2 (two) times daily., Disp: , Rfl:    SUMAtriptan (IMITREX) 50 MG tablet, Take 1 tablet (50 mg total) by mouth every 2 (two) hours as needed for migraine. May repeat in 2 hours if headache persists or recurs., Disp: 10 tablet, Rfl: 0   traZODone (DESYREL) 100 MG tablet, Take 100-200 mg by mouth at bedtime., Disp: , Rfl:    tretinoin (RETIN-A) 0.025 % cream, Apply 1 application topically daily as needed., Disp: , Rfl:    triamcinolone (NASACORT) 55 MCG/ACT AERO nasal inhaler, Place 1 spray into the nose daily., Disp: , Rfl:    venlafaxine (EFFEXOR) 37.5 MG tablet, Take 37.5 mg by mouth 2 (two) times daily., Disp: , Rfl:    triamcinolone cream (KENALOG) 0.1 %, triamcinolone acetonide 0.1 % topical cream  APPLY TO AFFECTED AREAS TWICE A DAY X1-2 WEEKS, Disp: , Rfl:   Review of Systems:  Constitutional: Denies fever, chills, diaphoresis, appetite change and fatigue.  HEENT: Denies eye pain, redness, hearing loss, ear pain, congestion, sore  throat, rhinorrhea, sneezing, mouth sores, trouble swallowing, neck pain, neck stiffness and tinnitus.   Respiratory: Denies SOB, DOE, cough, chest tightness,  and wheezing.   Cardiovascular: Denies chest pain, palpitations and leg swelling.  Gastrointestinal: Denies vomiting, abdominal pain, diarrhea, constipation, blood in stool and abdominal distention.  Genitourinary: Denies dysuria, urgency, frequency, hematuria, flank pain and difficulty urinating.  Endocrine: Denies: hot or cold intolerance, sweats, changes in hair or nails, polyuria, polydipsia. Musculoskeletal: Denies myalgias, back pain, joint swelling, arthralgias and gait problem.  Skin: Denies pallor, rash  and wound.  Neurological: Denies dizziness, seizures, syncope, weakness, light-headedness, numbness. Hematological: Denies adenopathy. Easy bruising, personal or family bleeding history  Psychiatric/Behavioral: Denies suicidal ideation, mood changes, confusion, nervousness, sleep disturbance and agitation    Physical Exam: Vitals:   02/11/21 1055  BP: 110/70  Pulse: 82  Temp: 98.5 F (36.9 C)  TempSrc: Oral  SpO2: 98%  Weight: 167 lb 4.8 oz (75.9 kg)    Body mass index is 27.84 kg/m.    Constitutional: NAD, calm, comfortable Eyes: PERRL, lids and conjunctivae normal ENMT: Mucous membranes are moist.  Neurologic: Grossly intact and nonfocal Psychiatric: Normal judgment and insight. Alert and oriented x 3. Normal mood.    Impression and Plan:  Migraine without status migrainosus, not intractable, unspecified migraine type  - Plan: SUMAtriptan (IMITREX) 50 MG tablet, promethazine (PHENERGAN) 25 MG tablet -We discussed trying Imitrex as an abortive migraine medication as well as Phenergan for her nausea, I believe the sedating properties of Phenergan will also be beneficial for her migraine. -She will reach out to [place] if no improvement.     Lelon Frohlich, MD [facility]

## 2021-02-16 ENCOUNTER — Other Ambulatory Visit: Payer: Self-pay | Admitting: Gastroenterology

## 2021-04-26 ENCOUNTER — Other Ambulatory Visit: Payer: Self-pay | Admitting: Gastroenterology

## 2021-05-22 ENCOUNTER — Other Ambulatory Visit: Payer: Self-pay | Admitting: Gastroenterology

## 2021-05-25 ENCOUNTER — Other Ambulatory Visit: Payer: Self-pay | Admitting: Otolaryngology

## 2021-05-25 DIAGNOSIS — J329 Chronic sinusitis, unspecified: Secondary | ICD-10-CM

## 2021-06-02 ENCOUNTER — Emergency Department (HOSPITAL_BASED_OUTPATIENT_CLINIC_OR_DEPARTMENT_OTHER)
Admission: EM | Admit: 2021-06-02 | Discharge: 2021-06-02 | Disposition: A | Discharge status: Home / Self Care | Payer: No Typology Code available for payment source | Attending: Emergency Medicine | Admitting: Emergency Medicine

## 2021-06-02 ENCOUNTER — Other Ambulatory Visit: Payer: Self-pay

## 2021-06-02 ENCOUNTER — Encounter (HOSPITAL_BASED_OUTPATIENT_CLINIC_OR_DEPARTMENT_OTHER): Payer: Self-pay

## 2021-06-02 ENCOUNTER — Emergency Department (HOSPITAL_BASED_OUTPATIENT_CLINIC_OR_DEPARTMENT_OTHER): Payer: No Typology Code available for payment source

## 2021-06-02 DIAGNOSIS — R748 Abnormal levels of other serum enzymes: Secondary | ICD-10-CM | POA: Insufficient documentation

## 2021-06-02 DIAGNOSIS — Z79899 Other long term (current) drug therapy: Secondary | ICD-10-CM | POA: Insufficient documentation

## 2021-06-02 DIAGNOSIS — R1013 Epigastric pain: Secondary | ICD-10-CM | POA: Diagnosis present

## 2021-06-02 DIAGNOSIS — R112 Nausea with vomiting, unspecified: Secondary | ICD-10-CM | POA: Insufficient documentation

## 2021-06-02 LAB — COMPREHENSIVE METABOLIC PANEL
ALT: 24 U/L (ref 0–44)
AST: 25 U/L (ref 15–41)
Albumin: 4.2 g/dL (ref 3.5–5.0)
Alkaline Phosphatase: 60 U/L (ref 38–126)
Anion gap: 10 (ref 5–15)
BUN: 14 mg/dL (ref 6–20)
CO2: 22 mmol/L (ref 22–32)
Calcium: 9 mg/dL (ref 8.9–10.3)
Chloride: 105 mmol/L (ref 98–111)
Creatinine, Ser: 1.24 mg/dL — ABNORMAL HIGH (ref 0.44–1.00)
GFR, Estimated: 53 mL/min — ABNORMAL LOW (ref >60)
Glucose, Bld: 92 mg/dL (ref 70–99)
Potassium: 3.7 mmol/L (ref 3.5–5.1)
Sodium: 137 mmol/L (ref 135–145)
Total Bilirubin: 0.4 mg/dL (ref 0.3–1.2)
Total Protein: 7 g/dL (ref 6.5–8.1)

## 2021-06-02 LAB — CBC
HCT: 37.5 % (ref 36.0–46.0)
Hemoglobin: 12.7 g/dL (ref 12.0–15.0)
MCH: 28.7 pg (ref 26.0–34.0)
MCHC: 33.9 g/dL (ref 30.0–36.0)
MCV: 84.7 fL (ref 80.0–100.0)
Platelets: 257 10*3/uL (ref 150–400)
RBC: 4.43 MIL/uL (ref 3.87–5.11)
RDW: 12.2 % (ref 11.5–15.5)
WBC: 8.8 10*3/uL (ref 4.0–10.5)
nRBC: 0 % (ref 0.0–0.2)

## 2021-06-02 LAB — TROPONIN I (HIGH SENSITIVITY): Troponin I (High Sensitivity): 6 ng/L (ref <18)

## 2021-06-02 LAB — LIPASE, BLOOD: Lipase: 95 U/L — ABNORMAL HIGH (ref 11–51)

## 2021-06-02 MED ORDER — OXYCODONE-ACETAMINOPHEN 5-325 MG PO TABS: 1.0000 | ORAL_TABLET | Freq: Four times a day (QID) | ORAL | 0 refills | Status: DC | PRN

## 2021-06-02 MED ORDER — HYDROMORPHONE HCL 1 MG/ML IJ SOLN
1.0000 mg | Freq: Once | INTRAMUSCULAR | Status: AC
  Administered 2021-06-02: 1 mg via INTRAVENOUS
  Filled 2021-06-02: qty 1

## 2021-06-02 MED ORDER — LACTATED RINGERS IV BOLUS
1000.0000 mL | Freq: Once | INTRAVENOUS | Status: AC
  Administered 2021-06-02: 1000 mL via INTRAVENOUS

## 2021-06-02 MED ORDER — ONDANSETRON HCL 4 MG/2ML IJ SOLN
4.0000 mg | Freq: Once | INTRAMUSCULAR | Status: AC
  Administered 2021-06-02: 4 mg via INTRAVENOUS
  Filled 2021-06-02: qty 2

## 2021-06-02 MED ORDER — ONDANSETRON 4 MG PO TBDP: 4.0000 mg | ORAL_TABLET | Freq: Three times a day (TID) | ORAL | 0 refills | Status: DC | PRN

## 2021-06-02 NOTE — ED Triage Notes (Signed)
Patient here POV from Home with ABD Pain.  Pain is Mid/Epigastric Region. Pain began this Afternoon and is Intermittent in Intensity.   No Fevers. Severe Nausea. No Vomiting. No Diarrhea.  Vicodin at 2000, Zofran at 2100. Bentyl today as well.  Patient uncomfortable during Triage. A&Ox4. GCS 15. Ambulatory.

## 2021-06-02 NOTE — Discharge Instructions (Signed)
You were evaluated in the Emergency Department and after careful evaluation, we did not find any emergent condition requiring admission or further testing in the hospital.  Your exam/testing today was overall reassuring.  While your CT scan did not show evidence of pancreatitis, you have epigastric pain with associated nausea and vomiting and your lipase was elevated to 95.  While you do not meet the medical definition for acute pancreatitis, it is likely because of your early presentation to the ED today.  We will treat you for presumed pancreatitis.  We will treat with Percocet for pain control, recommend clear liquid diet, Zofran for nausea.  Advance your diet slowly over the next few days.  Please return to the Emergency Department if you experience any worsening of your condition.  Thank you for allowing [place] to be a part of your care.

## 2021-06-02 NOTE — ED Notes (Signed)
Patient transported to CT 

## 2021-06-02 NOTE — ED Provider Notes (Signed)
[facility] EMERGENCY DEPT Provider Note   CSN: [id number] Arrival date & time: 06/02/21  2120     History  Chief Complaint  Patient presents with   Abdominal Pain    Jordan Mayer is a 51 y.o. female.   Abdominal Pain  51 year old female with a medical history significant for pancreatitis, status post cholecystectomy who presents to the emergency department with epigastric abdominal pain, nausea and vomiting that began today.  The patient states that she has had severe nausea without vomiting.  She is able to tolerate some liquids.  Pain is in her epigastric region and radiates to her back.  She states that it feels like similar episodes of pancreatitis.  She took medications for this to include Bentyl, Zofran and Vicodin at home.  Home Medications Prior to Admission medications   Medication Sig Start Date End Date Taking? Authorizing Provider  ondansetron (ZOFRAN-ODT) 4 MG disintegrating tablet Take 1 tablet (4 mg total) by mouth every 8 (eight) hours as needed for nausea or vomiting. 06/02/21  Yes Regan Lemming, MD  oxyCODONE-acetaminophen (PERCOCET/ROXICET) 5-325 MG tablet Take 1 tablet by mouth every 6 (six) hours as needed for severe pain. 06/02/21  Yes Regan Lemming, MD  albuterol (VENTOLIN HFA) 108 (90 Base) MCG/ACT inhaler Inhale into the lungs as needed. 01/28/20   [provider]  clindamycin (CLEOCIN T) 1 % lotion Apply 1 application topically once a week.    [provider]  colestipol (COLESTID) 1 g tablet colestipol 1 gram tablet  TAKE 1 TABLET (1 G TOTAL) BY MOUTH 2 (TWO) TIMES DAILY. FOR DIARRHEA    [provider]  cyclobenzaprine (FLEXERIL) 10 MG tablet Take 10 mg by mouth daily as needed for muscle spasms.    [provider]  dicyclomine (BENTYL) 10 MG capsule TAKE 1 CAPS BY MOUTH 4XDAILY. TAKE 30 MINUTES BEFORE MEALS AND AT BEDTIME 05/22/21   Cirigliano, Vito V, DO  EPINEPHrine 0.3 mg/0.3 mL IJ SOAJ injection  Inject 0.3 mLs into the skin daily as needed (allergic reactions).    [provider]  EVEKEO 10 MG TABS Take by mouth. Take 2 tablets in the morning and 1 in the afternoon 06/15/20   [provider]  [name] 24 FE 1-20 MG-MCG(24) tablet Take 1 tablet by mouth daily. 04/27/20   [provider]  L-Methylfolate-Algae (DEPLIN 15 PO) Take 1 tablet by mouth daily.    [provider]  Multiple Vitamin (MULTIVITAMIN) tablet Take 1 tablet by mouth daily.    [provider]  pantoprazole (PROTONIX) 20 MG tablet Take 1 tablet (20 mg total) by mouth daily. 12/19/20   Cirigliano, Vito V, DO  promethazine (PHENERGAN) 25 MG tablet Take 1 tablet (25 mg total) by mouth every 8 (eight) hours as needed for nausea or vomiting. 02/11/21   Isaac Bliss, Rayford Halsted, MD  spironolactone (ALDACTONE) 100 MG tablet Take 100 mg by mouth 2 (two) times daily.    [provider]  SUMAtriptan (IMITREX) 50 MG tablet Take 1 tablet (50 mg total) by mouth every 2 (two) hours as needed for migraine. May repeat in 2 hours if headache persists or recurs. 02/11/21   Isaac Bliss, Rayford Halsted, MD  traZODone (DESYREL) 100 MG tablet Take 100-200 mg by mouth at bedtime. 11/03/13   Luana Shu, Meridith, NP  tretinoin (RETIN-A) 0.025 % cream Apply 1 application topically daily as needed.    [provider]  triamcinolone (NASACORT) 55 MCG/ACT AERO nasal inhaler Place 1 spray into  the nose daily.    [provider]  triamcinolone cream (KENALOG) 0.1 % triamcinolone acetonide 0.1 % topical cream  APPLY TO AFFECTED AREAS TWICE A DAY X1-2 WEEKS    [provider]  venlafaxine (EFFEXOR) 37.5 MG tablet Take 37.5 mg by mouth 2 (two) times daily.    [provider]      Allergies    Contrast media [iodinated contrast media], Iohexol, Penicillins, and Gadolinium derivatives    Review of Systems   Review of Systems  Gastrointestinal:  Positive for abdominal pain.  All  other systems reviewed and are negative.  Physical Exam Updated Vital Signs BP 102/69    Pulse 79    Temp (!) 97.5 F (36.4 C)    Resp 17    Ht 5\' 5"  (1.651 m)    Wt 75.9 kg    SpO2 100%    BMI 27.85 kg/m  Physical Exam Vitals and nursing note reviewed.  Constitutional:      General: She is not in acute distress.    Appearance: She is well-developed.  HENT:     Head: Normocephalic and atraumatic.  Eyes:     Conjunctiva/sclera: Conjunctivae normal.     Pupils: Pupils are equal, round, and reactive to light.  Cardiovascular:     Rate and Rhythm: Normal rate and regular rhythm.     Pulses:          Radial pulses are 2+ on the right side and 2+ on the left side.     Heart sounds: No murmur heard. Pulmonary:     Effort: Pulmonary effort is normal. No respiratory distress.     Breath sounds: Normal breath sounds.  Abdominal:     General: There is no distension.     Palpations: Abdomen is soft.     Tenderness: There is abdominal tenderness in the epigastric area. There is no guarding or rebound.  Musculoskeletal:        General: No swelling, deformity or signs of injury.     Cervical back: Neck supple.  Skin:    General: Skin is warm and dry.     Capillary Refill: Capillary refill takes less than 2 seconds.     Findings: No lesion or rash.  Neurological:     General: No focal deficit present.     Mental Status: She is alert. Mental status is at baseline.  Psychiatric:        Mood and Affect: Mood normal.    ED Results / Procedures / Treatments   Labs (all labs ordered are listed, but only abnormal results are displayed) Labs Reviewed  LIPASE, BLOOD - Abnormal; Notable for the following components:      Result Value   Lipase 95 (*)    All other components within normal limits  COMPREHENSIVE METABOLIC PANEL - Abnormal; Notable for the following components:   Creatinine, Ser 1.24 (*)    GFR, Estimated 53 (*)    All other components within normal limits  URINALYSIS,  ROUTINE W REFLEX MICROSCOPIC - Abnormal; Notable for the following components:   Ketones, ur TRACE (*)    All other components within normal limits  CBC  TRIGLYCERIDES  TROPONIN I (HIGH SENSITIVITY)    EKG EKG Interpretation  Date/Time:  Tuesday June 02 2021 22:38:19 EST Ventricular Rate:  77 PR Interval:  146 QRS Duration: 96 QT Interval:  408 QTC Calculation: 462 R Axis:   75 Text Interpretation: Sinus rhythm Confirmed by Regan Lemming ([id number]) on  06/02/2021 10:55:21 PM  Radiology CT ABDOMEN PELVIS WO CONTRAST  Result Date: 06/02/2021 CLINICAL DATA:  Nausea and vomiting. EXAM: CT ABDOMEN AND PELVIS WITHOUT CONTRAST TECHNIQUE: Multidetector CT imaging of the abdomen and pelvis was performed following the standard protocol without IV contrast. RADIATION DOSE REDUCTION: This exam was performed according to the departmental dose-optimization program which includes automated exposure control, adjustment of the mA and/or kV according to patient size and/or use of iterative reconstruction technique. COMPARISON:  CT abdomen and pelvis 06/02/2020. FINDINGS: Lower chest: No acute abnormality. Hepatobiliary: No focal liver abnormality is seen. Status post cholecystectomy. No biliary dilatation. Pancreas: Unremarkable. No pancreatic ductal dilatation or surrounding inflammatory changes. Spleen: Normal in size without focal abnormality. Adrenals/Urinary Tract: There is a punctate nonobstructing left renal calculus measuring 3 mm, unchanged from prior. Otherwise, the kidneys, bladder, and adrenal glands are within normal limits. Stomach/Bowel: There are surgical changes in the stomach, similar to the prior study. Stomach is nondilated. Appendix appears normal. No evidence of bowel wall thickening, distention, or inflammatory changes. Vascular/Lymphatic: No significant vascular findings are present. No enlarged abdominal or pelvic lymph nodes. Reproductive: Uterus and bilateral adnexa are unremarkable.  Other: No abdominal wall hernia or abnormality. No abdominopelvic ascites. Musculoskeletal: No acute or significant osseous findings. IMPRESSION: 1. No acute localizing process in the abdomen or pelvis. 2. Nonobstructing left renal calculus. Electronically Signed   By: Ronney Asters M.D.   On: 06/02/2021 23:02    Procedures Procedures    Medications Ordered in ED Medications  HYDROmorphone (DILAUDID) injection 1 mg (1 mg Intravenous Given 06/02/21 2215)  ondansetron (ZOFRAN) injection 4 mg (4 mg Intravenous Given 06/02/21 2213)  lactated ringers bolus 1,000 mL (0 mLs Intravenous Stopped 06/02/21 2333)    ED Course/ Medical Decision Making/ A&P                           Medical Decision Making Amount and/or Complexity of Data Reviewed Labs: ordered. Radiology: ordered.  Risk Prescription drug management.   51 year old female with a medical history significant for pancreatitis, status post cholecystectomy who presents to the emergency department with epigastric abdominal pain, nausea and vomiting that began today.  The patient states that she has had severe nausea without vomiting.  She is able to tolerate some liquids.  Pain is in her epigastric region and radiates to her back.  She states that it feels like similar episodes of pancreatitis.  She took medications for this to include Bentyl, Zofran and Vicodin at home.  On arrival, the patient was afebrile, hemodynamically stable, normal sinus rhythm noted on cardiac telemetry.  The patient's physical exam was concerning for epigastric tenderness to palpation.  The patient has a history of episodes of pancreatitis and has similar presentation with severe nausea, epigastric pain radiating to the back.  Work-up initiated to include screening labs and CT abdomen pelvis.  The patient had normal triglycerides, elevated lipase 95, urinalysis that was unremarkable, normal cardiac troponin, CBC unremarkable, CMP unremarkable with a mildly elevated  creatinine at 1.24, blood glucose normal, renal function and liver function normal.  The patient  underwent CT abdomen pelvis which revealed no acute localizing process in the abdomen or pelvis with a nonobstructing left renal calculus with a pancreas that was unremarkable with no pancreatic ductal dilatation or surrounding inflammatory changes.  Her EKG revealed no ischemic changes.  She had bilateral 2+ radial pulses.  Low concern for acute intrathoracic process at this time with  low concern for PE, pneumothorax, Boerhaave's, ACS, aortic dissection.  Symptoms are most consistent with acute pancreatitis although the patient does not meet criteria for this definition.  We will treat this as early pancreatitis.  Advised bowel rest, slow liquid clear diet with a plan for Zofran and pain control at home which was prescribed for the patient.  Return precautions provided.  Patient was comfortable with the plan for discharge.    Final Clinical Impression(s) / ED Diagnoses Final diagnoses:  Epigastric pain  Elevated lipase    Rx / DC Orders ED Discharge Orders          Ordered    ondansetron (ZOFRAN-ODT) 4 MG disintegrating tablet  Every 8 hours PRN        06/02/21 2333    oxyCODONE-acetaminophen (PERCOCET/ROXICET) 5-325 MG tablet  Every 6 hours PRN        06/02/21 2333              Regan Lemming, MD 06/04/21 2044

## 2021-06-03 LAB — URINALYSIS, ROUTINE W REFLEX MICROSCOPIC
Bilirubin Urine: NEGATIVE
Glucose, UA: NEGATIVE mg/dL
Hgb urine dipstick: NEGATIVE
Leukocytes,Ua: NEGATIVE
Nitrite: NEGATIVE
Protein, ur: NEGATIVE mg/dL
Specific Gravity, Urine: 1.013 (ref 1.005–1.030)
pH: 5.5 (ref 5.0–8.0)

## 2021-06-03 LAB — TRIGLYCERIDES: Triglycerides: 115 mg/dL (ref <150)

## 2021-06-17 ENCOUNTER — Other Ambulatory Visit: Payer: Self-pay

## 2021-06-17 ENCOUNTER — Ambulatory Visit: Payer: No Typology Code available for payment source | Admitting: Gastroenterology

## 2021-06-17 ENCOUNTER — Encounter: Payer: Self-pay | Admitting: Gastroenterology

## 2021-06-17 VITALS — BP 108/78 | HR 87 | Ht 65.0 in | Wt 162.5 lb

## 2021-06-17 DIAGNOSIS — Q453 Other congenital malformations of pancreas and pancreatic duct: Secondary | ICD-10-CM | POA: Diagnosis not present

## 2021-06-17 DIAGNOSIS — R1010 Upper abdominal pain, unspecified: Secondary | ICD-10-CM | POA: Diagnosis not present

## 2021-06-17 DIAGNOSIS — K859 Acute pancreatitis without necrosis or infection, unspecified: Secondary | ICD-10-CM

## 2021-06-17 DIAGNOSIS — K2100 Gastro-esophageal reflux disease with esophagitis, without bleeding: Secondary | ICD-10-CM | POA: Diagnosis not present

## 2021-06-17 NOTE — Patient Instructions (Signed)
If you are age 51 or older, your body mass index should be between 23-30. Your Body mass index is 27.04 kg/m. If this is out of the aforementioned range listed, please consider follow up with your Primary Care Provider.  If you are age 37 or younger, your body mass index should be between 19-25. Your Body mass index is 27.04 kg/m. If this is out of the aformentioned range listed, please consider follow up with your Primary Care Provider.   __________________________________________________________  The [facility] providers would like to encourage you to use [facility] to communicate with providers for non-urgent requests or questions.  Due to long hold times on the telephone, sending your provider a message by [facility] may be a faster and more efficient way to get a response.  Please allow 48 business hours for a response.  Please remember that this is for non-urgent requests.    Please go to the lab on the 2nd floor [address] in 2 weeks to check your lipase.  Due to recent changes in healthcare laws, you may see the results of your imaging and laboratory studies on MyChart before your provider has had a chance to review them.  We understand that in some cases there may be results that are confusing or concerning to you. Not all laboratory results come back in the same time frame and the provider may be waiting for multiple results in order to interpret others.  Please give [place] 48 hours in order for your provider to thoroughly review all the results before contacting the office for clarification of your results.   We are placing referrals for the genetics clinic and for [facility] for procedures. These clinics will contact you with appointments.  Thank you for choosing me and [facility].  Vito Cirigliano, D.O.

## 2021-06-17 NOTE — Progress Notes (Signed)
Chief Complaint:    Epigastric pain, pancreatitis  GI History: 51 year old female with a history of anxiety, asthma, ADD, depression, nephrolithiasis, PCOS, cholecystectomy, gastric sleeve.  Initially seen in the GI clinic in 06/2020 for evaluation of episodic abdominal pain.  Distinct episodes in 05/2020 and again 05/2021.  Describes as "labor pain" in a rhythmic and building pattern.  Evaluation to date -06/02/2020: ER evaluation.  Lipase 150, AST/ALT 65/48, otherwise normal bilirubin, ALP, CBC -06/02/2020: CT abdomen/pelvis: Focal region of mid mesenteric hazy stranding with numerous shotty reactive appearing clustered mid mesenteric lymph nodes suggestive of mesenteritis - Has had additional CTs in 05/2017, 07/2011 with prominent nonpathologically enlarged lymph nodes throughout the mesentery and retroperitoneum.  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 - 07/09/2020: MRI/MRCP: Pancreatic divisum without PD dilation.  Normal pancreas, liver.  Similar mesenteric stranding with prominent mesenteric lymph nodes.  No pathologically enlarged lymph nodes.  Gastric sleeve, ccy - 06/02/2021: ER evaluation.  Lipase 95, otherwise normal TG, CBC, CMP (creatinine 1.24) - 06/02/2021: CT abdomen/pelvis: Normal.  No enlarged lymph nodes  Endoscopic History: -07/2020: Colonoscopy: 2 mm sigmoid hyperplastic polyp, normal colonic mucosa (path negative for MC, IBD).  Grade 1 internal hemorrhoids.  Normal TI.  Repeat 10 years - 09/2020: EGD: LA grade a esophagitis, Hill grade 3.  Healthy-appearing sleeve gastrectomy, biopsies negative for H. pylori.  Normal duodenum.  Treated with Protonix 20 mg twice daily x6 weeks  HPI:     Patient is a 51 y.o. female presenting to the Gastroenterology Clinic for evaluation of epigastric pain.  She was seen in the ER for epigastric pain and nausea/vomiting x1 day on 06/02/2021.  Pain radiating to the back.  Evaluation notable for the  following: - Hemodynamically stable - Lipase 95 - Normal triglycerides, CBC, CMP (creatinine 1.24) - CT abdomen/pelvis: Normal liver, pancreas, GI tract.  No enlarged lymph nodes - Normal EKG - Was treated with Dilaudid, Zofran, LR and discharged with clear liquids, Zofran, Roxicet  Today, she states sxs are now resolved and starting to increase her diet again.   Very rare EtOH and maintians healthy diet.   Continues to take dicyclomine for abdominal cramping with good relief.  This is separate from the above abdominal pain.  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     clindamycin (CLEOCIN T) 1 % lotion Apply 1 application topically once a week.     colestipol (COLESTID) 1 g tablet colestipol 1 gram tablet  TAKE 1 TABLET (1 G TOTAL) BY MOUTH 2 (TWO) TIMES DAILY. FOR DIARRHEA     cyclobenzaprine (FLEXERIL) 10 MG tablet Take 10 mg by mouth daily as needed for muscle spasms.     dicyclomine (  BENTYL) 10 MG capsule TAKE 1 CAPS BY MOUTH 4XDAILY. TAKE 30 MINUTES BEFORE MEALS AND AT BEDTIME 120 capsule 0   EPINEPHrine 0.3 mg/0.3 mL IJ SOAJ injection Inject 0.3 mLs into the skin daily as needed (allergic reactions).     EVEKEO 10 MG TABS Take by mouth. Take 2 tablets in the morning and 1 in the afternoon     [name] 24 FE 1-20 MG-MCG(24) tablet Take 1 tablet by mouth  daily.     L-Methylfolate-Algae (DEPLIN 15 PO) Take 1 tablet by mouth daily.     Multiple Vitamin (MULTIVITAMIN) tablet Take 1 tablet by mouth daily.     ondansetron (ZOFRAN-ODT) 4 MG disintegrating tablet Take 1 tablet (4 mg total) by mouth every 8 (eight) hours as needed for nausea or vomiting. 20 tablet 0   oxyCODONE-acetaminophen (PERCOCET/ROXICET) 5-325 MG tablet Take 1 tablet by mouth every 6 (six) hours as needed for severe pain. 12 tablet 0   promethazine (PHENERGAN) 25 MG tablet Take 1 tablet (25 mg total) by mouth every 8 (eight) hours as needed for nausea or vomiting. 20 tablet 0   spironolactone (ALDACTONE) 100 MG tablet Take 100 mg by mouth 2 (two) times daily.     traZODone (DESYREL) 100 MG tablet Take 100-200 mg by mouth at bedtime.     tretinoin (RETIN-A) 0.025 % cream Apply 1 application topically daily as needed.     triamcinolone (NASACORT) 55 MCG/ACT AERO nasal inhaler Place 1 spray into the nose daily.     triamcinolone cream (KENALOG) 0.1 % triamcinolone acetonide 0.1 % topical cream  APPLY TO AFFECTED AREAS TWICE A DAY X1-2 WEEKS     venlafaxine (EFFEXOR) 37.5 MG tablet Take 37.5 mg by mouth 2 (two) times daily.     pantoprazole (PROTONIX) 20 MG tablet Take 1 tablet (20 mg total) by mouth daily. (Patient not taking: Reported on 06/17/2021) 30 tablet 2   No current facility-administered medications for this visit.    Physical Exam:     BP 108/78    Pulse 87    Ht 5\' 5"  (1.651 m)    Wt 162 lb 8 oz (73.7 kg)    SpO2 98%    BMI 27.04 kg/m   GENERAL:  Pleasant female in NAD PSYCH: : Cooperative, normal affect ABDOMEN:  Nondistended NEURO: Alert and oriented x 3, no focal neurologic deficits   IMPRESSION and PLAN:    1) Epigastric pain/Upper abdominal pain 2) Acute recurrent pancreatitis 3) Pancreas divisum  Interesting that she has prominent abdominal lymph nodes on several CT scans dating back to 2013, but no lymphadenopathy on most recent CT during an acute  episode which makes sclerosing mesenteritis (although no pathognomonic radiographic findings such as pseudocapsule, small bowel mass lesion, fat ring on CT) or misty mesentery a little less likely.    MRI/MRCP with P divisum.  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  If continued concern for malignant origin, there are some data to support PET/CT (single center small study and anecdotal data).    4) GERD with erosive esophagitis - Well-controlled Protonix - Continue PPI as prescribed -  Continue antireflux lifestyle/dietary modifications  I spent 40 minutes of time, including in depth chart review, independent review of results as outlined above, communicating results with the patient directly, face-to-face time with the patient, coordinating care, ordering studies and medications as appropriate, and documentation.       [place] ,DO, FACG 06/17/2021, 1:26 PM

## 2021-06-19 ENCOUNTER — Telehealth: Payer: Self-pay | Admitting: Licensed Clinical Social Worker

## 2021-06-19 NOTE — Telephone Encounter (Signed)
Scheduled appt per 2/1 referral. Spoke to pt who is aware of appt date and time. Pt is aware to arrive 15 mins prior to appt time.

## 2021-06-22 ENCOUNTER — Other Ambulatory Visit: Payer: Self-pay

## 2021-06-22 ENCOUNTER — Ambulatory Visit
Admission: RE | Admit: 2021-06-22 | Discharge: 2021-06-22 | Disposition: A | Discharge status: Home / Self Care | Payer: No Typology Code available for payment source | Source: Ambulatory Visit | Attending: Otolaryngology | Admitting: Otolaryngology

## 2021-06-22 DIAGNOSIS — J329 Chronic sinusitis, unspecified: Secondary | ICD-10-CM

## 2021-06-25 ENCOUNTER — Telehealth: Payer: Self-pay | Admitting: Gastroenterology

## 2021-06-25 NOTE — Telephone Encounter (Signed)
Patient called would like to speed things up and asked if the genetic testing appt can be done sooner.

## 2021-06-26 NOTE — Telephone Encounter (Signed)
Called and spoke with pt. Told pt to follow up with office who is doing the genetic testing for a sooner appt since she already has an appointment set up on 07/13/21. Pt also stated she has not heard anything from [facility] yet. [facility] at [facility] (phone: [phone]) and spoke with nurse who said they did not receive a referral for this pt.  Told nurse that ambulatory referral was placed in epic on 06/17/21. Nurse stated there was nothing in the system but we could fax referral to [phone].

## 2021-06-26 NOTE — Telephone Encounter (Signed)
Left the patient a voicemail that I have faxed the referral for the 2nd time to [facility], if she hears nothing from them by Wed to contact me.

## 2021-07-02 ENCOUNTER — Other Ambulatory Visit (INDEPENDENT_AMBULATORY_CARE_PROVIDER_SITE_OTHER): Payer: No Typology Code available for payment source

## 2021-07-02 DIAGNOSIS — Q453 Other congenital malformations of pancreas and pancreatic duct: Secondary | ICD-10-CM

## 2021-07-02 DIAGNOSIS — R1010 Upper abdominal pain, unspecified: Secondary | ICD-10-CM | POA: Diagnosis not present

## 2021-07-02 DIAGNOSIS — K859 Acute pancreatitis without necrosis or infection, unspecified: Secondary | ICD-10-CM | POA: Diagnosis not present

## 2021-07-02 LAB — LIPASE: Lipase: 22 U/L (ref 11.0–59.0)

## 2021-07-02 NOTE — Telephone Encounter (Signed)
Spoke with Jordan Mayer and she stated that she has not had a call from [facility] for an appointment. I told her that I would contact them and see what the hold up was on scheduling. The patient states she is feeling very bad and is only eating broth, having epigastric and abdominal pain would like to get in asap

## 2021-07-02 NOTE — Telephone Encounter (Addendum)
I spoke with [facility] and she stated that she saw no referral in the system for this patient. I explained we have to do an external referral which does not cross over through Epic. It was faxed for the 2nd time on 06/26/2021 she stated she is unable to see it and the patients records that were sent as well. It is possible the department that receives the faxes may have it. It could take 2-4 weeks for the patient to get a call for scheduling, but [facility] was again unable to verify that they have the records.   I am not sure what to do for this patient, I am unable to make any headway to get her even scheduled for her consultation at [facility], the patient is in pain and uncomfortable and is not happy with the wait.Plus that they are unable to track her referral. Please advise?

## 2021-07-13 ENCOUNTER — Inpatient Hospital Stay
Payer: No Typology Code available for payment source | Attending: Genetic Counselor | Admitting: Licensed Clinical Social Worker

## 2021-07-13 ENCOUNTER — Encounter: Payer: Self-pay | Admitting: Licensed Clinical Social Worker

## 2021-07-13 ENCOUNTER — Other Ambulatory Visit: Payer: Self-pay

## 2021-07-13 ENCOUNTER — Inpatient Hospital Stay: Payer: No Typology Code available for payment source

## 2021-07-13 DIAGNOSIS — Z808 Family history of malignant neoplasm of other organs or systems: Secondary | ICD-10-CM | POA: Diagnosis not present

## 2021-07-13 DIAGNOSIS — K859 Acute pancreatitis without necrosis or infection, unspecified: Secondary | ICD-10-CM | POA: Diagnosis not present

## 2021-07-13 LAB — GENETIC SCREENING ORDER

## 2021-07-13 NOTE — Progress Notes (Signed)
REFERRING PROVIDER: Lavena Bullion, DO [address] [place],  [place] [address]  PRIMARY PROVIDER:  Gerrit Heck V, DO  PRIMARY REASON FOR VISIT:  1. Acute recurrent pancreatitis   2. Family history of melanoma      HISTORY OF PRESENT ILLNESS:   Ms. Skarda, a 51 y.o. female, was seen for a [facility] cancer genetics consultation at the request of Dr. Bryan Lemma due to a personal history of pancreatitis and family history of cancer.  Ms. Manansala presents to clinic today to discuss the possibility of a hereditary predisposition to cancer, genetic testing, and to further clarify her future cancer risks, as well as potential cancer risks for family members.   Ms. Uriegas is a 51 y.o. female with no personal history of cancer. She sees Dr. Bryan Lemma for acute recurrent pancreatitis.     CANCER HISTORY:  Oncology History   No history exists.     RISK FACTORS:  Menarche was at age 2-12.  First live birth at age 3.  OCP use for approximately  <20  years.  Ovaries intact: yes.  Hysterectomy: no.  Menopausal status: premenopausal.  HRT use: 0 years. Colonoscopy: yes;  1 polyp . Mammogram within the last year: yes. Number of breast biopsies:  several . Up to date with pelvic exams: yes. 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    Shingles     Past Surgical History:  Procedure Laterality Date   BUNIONECTOMY Right    CHOLECYSTECTOMY     COLONOSCOPY     HIATAL HERNIA REPAIR     with gastric sleeve   LAPAROSCOPIC CHOLECYSTECTOMY SINGLE SITE WITH INTRAOPERATIVE CHOLANGIOGRAM N/A 06/14/2017   Procedure: LAPAROSCOPIC  CHOLECYSTECTOMY SINGLE SITE;  Surgeon: Michael Boston, MD;  Location: [facility];  Service: General;  Laterality: N/A;   LAPAROSCOPIC GASTRIC SLEEVE RESECTION  09/2013   LASER ABLATION     Vascular   TONSILLECTOMY     WISDOM TOOTH EXTRACTION      Social History   Socioeconomic History   Marital status: Married    Spouse name: Not on file   Number of children: 2   Years of education: Not on file   Highest education level: Not on file  Occupational History   Occupation: Nurse, adult  Tobacco Use   Smoking status: Never   Smokeless tobacco: Never  Vaping Use   Vaping Use: Never used  Substance and Sexual Activity   Alcohol use: Yes    Alcohol/week: 0.0 standard drinks    Comment: occasional, 1-2 monthly   Drug use: No   Sexual activity: Yes    Birth control/protection: None    Comment: husband had vasectomy  Other Topics Concern   Not on file  Social History Narrative   7 hours of sleep per night   Lives with her husband and 2 kids   Does not work/Full Time Volunteer   Has a dog in the home   Social Determinants of Health   Financial Resource Strain:  Not on file  Food Insecurity: Not on file  Transportation Needs: Not on file  Physical Activity: Not on file  Stress: Not on file  Social Connections: Not on file     FAMILY HISTORY:  We obtained a detailed, 4-generation family history.  Significant diagnoses are listed below: Family History  Problem Relation Age of Onset   Varicose Veins Mother    Asthma Mother    Varicose Veins Sister    Birth defects Paternal Aunt        Cognitively Challenged   Miscarriages / Stillbirths Paternal Aunt    Varicose Veins Paternal Aunt    Varicose Veins Paternal Aunt    Melanoma Paternal Aunt        melanoma x2   Arthritis Maternal Grandmother    Arthritis Paternal Grandfather    Hearing loss Paternal Grandfather    Prostate cancer Cousin    Colon cancer Neg Hx    Colon polyps Neg Hx    Esophageal cancer Neg Hx     Rectal cancer Neg Hx    Stomach cancer Neg Hx    Ms. Heimann has 2 sons, 37 and 54. She has 1 brother and 1 sister, no cancers or history of pancreatitis.   Ms. Greiner mother is living at 105 and has not had cancer or pancreatitis. Patient has 1 maternal uncle, 1 maternal half aunt through her grandfather. This aunt's daughter was diagnosed with cystic fibrosis. Maternal grandfather died over 44, possibly had cancer. Maternal grandmother died at 88 and her mother was Ashkenazi Jewish.  Ms. Heard father is living at 78 and has not had cancer or pancreatitis. Patient had 3 paternal aunts. One has had melanoma at least twice. No known cousins with cancer. Paternal grandmother died in her early 11s of lung disease. Grandfather died at 68 and had a sibling whose son had prostate cancer in his 80s.   Ms. Ledin is unaware of previous family history of genetic testing for hereditary cancer risks. Patient's maternal ancestors are of [place] Jewish descent, and paternal ancestors are of European descent. There is no known consanguinity.     GENETIC COUNSELING ASSESSMENT: Ms. Hehn is a 51 y.o. female with a personal history of pancreatitis/family history of cancer which is somewhat suggestive of a hereditary cancer syndrome/hereditary pancreatitis and predisposition to cancer. We, therefore, discussed and recommended the following at today's visit.   DISCUSSION:  We discussed that hereditary pancreatitis can be caused by genes including PRSS1, SPINK1 and sometimes CFTR. Chronic pancreatitis caused by mutations in these genes can increase risk for pancreatic cancer. We discussed that approximately 10% of cancer is hereditary. Most cases of hereditary prostate cancer are associated with BRCA1/BRCA2 genes. People who have Ashkenazi Jewish ancestry also have an increased risk to have a BRCA1/BRCA2 founder mutation. There are other genes associated with hereditary cancer as well. Cancers and risks are  gene specific.  We discussed that testing is beneficial for several reasons including knowing about cancer risks, identifying potential screening and risk-reduction options that may be appropriate, and to understand if other family members could be at risk for cancer and allow them to undergo genetic testing.   We reviewed the characteristics, features and inheritance patterns of hereditary cancer syndromes. We also discussed genetic testing, including the appropriate family members to test, the process of testing, insurance coverage and turn-around-time for results. We discussed the implications of a negative, positive and/or variant of uncertain significant result. We recommended Ms. Marling pursue genetic testing for  the [facility] Multi-Cancer+Pancreatitis gene panel.   Based on Ms. Salata's family history of Ashkenazi Jewish ancestry, she meets medical criteria for genetic testing (NCCN v.2.2023 - testing may be considered for those with AJ ancestry and no additional risk factors) Despite that she meets criteria, she may still have an out of pocket cost. We discussed that if her out of pocket cost for testing is over $100, the laboratory will call and confirm whether she wants to proceed with testing.  If the out of pocket cost of testing is less than $100 she will be billed by the genetic testing laboratory.   PLAN: After considering the risks, benefits, and limitations, Ms. Frakes provided informed consent to pursue genetic testing and the blood sample was sent to [facility] for analysis of the Multi-Cancer+Pancreatitis panel. Results should be available within approximately 2-3 weeks' time, at which point they will be disclosed by telephone to Ms. Gibbard, as will any additional recommendations warranted by these results. Ms. Odle will receive a summary of her genetic counseling visit and a copy of her results once available. This information will also be available in Epic.   Ms. Tristan questions  were answered to her satisfaction today. Our contact information was provided should additional questions or concerns arise. Thank you for the referral and allowing [place] to share in the care of your patient.   Faith Rogue, MS, [facility] Genetic Counselor [email or web address] Phone: [phone]  The patient was seen for a total of 30 minutes in face-to-face genetic counseling.  Patient was seen alone. Dr. Grayland Ormond was available for discussion regarding this case.   _______________________________________________________________________ For Office Staff:  Number of people involved in session: 1 Was an Intern/ student involved with case: no

## 2021-07-21 ENCOUNTER — Ambulatory Visit: Payer: Self-pay | Admitting: Licensed Clinical Social Worker

## 2021-07-21 ENCOUNTER — Telehealth: Payer: Self-pay | Admitting: Licensed Clinical Social Worker

## 2021-07-21 ENCOUNTER — Encounter: Payer: Self-pay | Admitting: Licensed Clinical Social Worker

## 2021-07-21 DIAGNOSIS — Z1379 Encounter for other screening for genetic and chromosomal anomalies: Secondary | ICD-10-CM

## 2021-07-21 NOTE — Telephone Encounter (Signed)
Revealed negative genetic testing.  This normal result is reassuring.  It is unlikely that there is an increased risk of cancer due to a mutation in one of these genes.  However, genetic testing is not perfect, and cannot definitively rule out a hereditary cause.  It will be important for her to keep in contact with genetics to learn if any additional testing may be needed in the future.      

## 2021-07-21 NOTE — Progress Notes (Signed)
HPI:  Jordan Mayer was previously seen in the [facility] clinic due to a personal history of pancreatitis and concerns regarding a hereditary predisposition to cancer. Please refer to our prior cancer genetics clinic note for more information regarding our discussion, assessment and recommendations, at the time. Jordan Mayer recent genetic test results were disclosed to her, as were recommendations warranted by these results. These results and recommendations are discussed in more detail below. ? ?CANCER HISTORY:  ?Oncology History  ? No history exists.  ? ? ?FAMILY HISTORY:  ?We obtained a detailed, 4-generation family history.  Significant diagnoses are listed below: ?Family History  ?Problem Relation Age of Onset  ? Varicose Veins Mother   ? Asthma Mother   ? Varicose Veins Sister   ? Birth defects Paternal Aunt   ?     Cognitively Challenged  ? Miscarriages / Stillbirths Paternal Aunt   ? Varicose Veins Paternal Aunt   ? Varicose Veins Paternal Aunt   ? Melanoma Paternal Aunt   ?     melanoma x2  ? Arthritis Maternal Grandmother   ? Arthritis Paternal Grandfather   ? Hearing loss Paternal Grandfather   ? Prostate cancer Cousin   ? Colon cancer Neg Hx   ? Colon polyps Neg Hx   ? Esophageal cancer Neg Hx   ? Rectal cancer Neg Hx   ? Stomach cancer Neg Hx   ? ?Jordan Mayer has 2 sons, 54 and 58. She has 1 brother and 1 sister, no cancers or history of pancreatitis.  ?  ?Jordan Mayer's mother is living at 68 and has not had cancer or pancreatitis. Patient has 1 maternal uncle, 1 maternal half aunt through her grandfather. This aunt's daughter was diagnosed with cystic fibrosis. Maternal grandfather died over 69, possibly had cancer. Maternal grandmother died at 81 and her mother was Ashkenazi Jewish. ?  ?Jordan Mayer's father is living at 47 and has not had cancer or pancreatitis. Patient had 3 paternal aunts. One has had melanoma at least twice. No known cousins with cancer. Paternal grandmother died in her  early 8s of lung disease. Grandfather died at 57 and had a sibling whose son had prostate cancer in his 75s.  ?  ?Jordan Mayer is unaware of previous family history of genetic testing for hereditary cancer risks. Patient's maternal ancestors are of [place] Jewish descent, and paternal ancestors are of European descent. There is no known consanguinity. ?  ?  ? ? ?GENETIC TEST RESULTS: Genetic testing reported out on 07/21/2021 through the [facility] Multi-Cancer+pancreatitis cancer panel found no pathogenic mutations.  ? ?The test report has been scanned into EPIC and is located under the Molecular Pathology section of the Results Review tab.  A portion of the result report is included below for reference.  ? ? ? ?We discussed that because current genetic testing is not perfect, it is possible there may be a gene mutation in one of these genes that current testing cannot detect, but that chance is small.  There could be another gene that has not yet been discovered, or that we have not yet tested, that is responsible for the cancer diagnoses in the family. It is also possible there is a hereditary cause for the cancer in the family that Jordan Mayer did not inherit and therefore was not identified in her testing.  Therefore, it is important to remain in touch with cancer genetics in the future so that we can continue to offer Jordan Mayer  the most up to date genetic testing.  ? ?ADDITIONAL GENETIC TESTING: We discussed with Jordan Mayer that her genetic testing was fairly extensive.  If there are genes identified to increase cancer risk that can be analyzed in the future, we would be happy to discuss and coordinate this testing at that time.   ? ?CANCER SCREENING RECOMMENDATIONS: Jordan Mayer test result is considered negative (normal).  This means that we have not identified a hereditary cause for her  personal history of pancreatitisand family history of cancer at this time.   ? ?While reassuring, this does not  definitively rule out a hereditary predisposition to cancer. It is still possible that there could be genetic mutations that are undetectable by current technology. There could be genetic mutations in genes that have not been tested or identified to increase cancer risk.  Therefore, it is recommended she continue to follow the cancer management and screening guidelines provided by her primary healthcare provider.  ? ?An individual's cancer risk and medical management are not determined by genetic test results alone. Overall cancer risk assessment incorporates additional factors, including personal medical history, family history, and any available genetic information that may result in a personalized plan for cancer prevention and surveillance. ? ?RECOMMENDATIONS FOR FAMILY MEMBERS:  Relatives in this family might be at some increased risk of developing cancer, over the general population risk, simply due to the family history of cancer.  We recommended female relatives in this family have a yearly mammogram beginning at age 54, or 32 years younger than the earliest onset of cancer, an annual clinical breast exam, and perform monthly breast self-exams. Female relatives in this family should also have a gynecological exam as recommended by their primary provider.  All family members should be referred for colonoscopy starting at age 4.  ? ?FOLLOW-UP: Lastly, we discussed with Jordan Mayer that cancer genetics is a rapidly advancing field and it is possible that new genetic tests will be appropriate for her and/or her family members in the future. We encouraged her to remain in contact with cancer genetics on an annual basis so we can update her personal and family histories and let her know of advances in cancer genetics that may benefit this family.  ? ?Our contact number was provided. Jordan Mayer questions were answered to her satisfaction, and she knows she is welcome to call us at anytime with additional questions  or concerns.  ? ?Faith Rogue, MS, LCGC ?Genetic Counselor ?[email or web address] ?Phone: [phone] ? ?

## 2021-08-19 ENCOUNTER — Other Ambulatory Visit: Payer: Self-pay | Admitting: Obstetrics & Gynecology

## 2021-08-19 DIAGNOSIS — R1030 Lower abdominal pain, unspecified: Secondary | ICD-10-CM

## 2021-08-19 DIAGNOSIS — R928 Other abnormal and inconclusive findings on diagnostic imaging of breast: Secondary | ICD-10-CM

## 2021-08-24 ENCOUNTER — Ambulatory Visit
Admission: RE | Admit: 2021-08-24 | Discharge: 2021-08-24 | Disposition: A | Discharge status: Home / Self Care | Payer: No Typology Code available for payment source | Source: Ambulatory Visit | Attending: Obstetrics & Gynecology | Admitting: Obstetrics & Gynecology

## 2021-08-24 DIAGNOSIS — R928 Other abnormal and inconclusive findings on diagnostic imaging of breast: Secondary | ICD-10-CM

## 2021-09-09 ENCOUNTER — Ambulatory Visit
Admission: RE | Admit: 2021-09-09 | Discharge: 2021-09-09 | Disposition: A | Discharge status: Home / Self Care | Payer: No Typology Code available for payment source | Source: Ambulatory Visit | Attending: Obstetrics & Gynecology | Admitting: Obstetrics & Gynecology

## 2021-09-09 DIAGNOSIS — R1030 Lower abdominal pain, unspecified: Secondary | ICD-10-CM

## 2021-09-09 LAB — HM MAMMOGRAPHY

## 2021-11-05 ENCOUNTER — Ambulatory Visit: Payer: No Typology Code available for payment source | Admitting: Family Medicine

## 2021-11-05 VITALS — BP 132/90 | HR 97 | Temp 99.2°F | Wt 164.2 lb

## 2021-11-05 DIAGNOSIS — G8929 Other chronic pain: Secondary | ICD-10-CM

## 2021-11-05 DIAGNOSIS — R519 Headache, unspecified: Secondary | ICD-10-CM | POA: Diagnosis not present

## 2021-11-05 DIAGNOSIS — Z8619 Personal history of other infectious and parasitic diseases: Secondary | ICD-10-CM | POA: Diagnosis not present

## 2021-11-05 DIAGNOSIS — B029 Zoster without complications: Secondary | ICD-10-CM

## 2021-11-05 MED ORDER — GABAPENTIN 100 MG PO CAPS: 100.0000 mg | ORAL_CAPSULE | Freq: Three times a day (TID) | ORAL | 0 refills | Status: DC | PRN

## 2021-11-06 ENCOUNTER — Ambulatory Visit (HOSPITAL_COMMUNITY)
Admission: EM | Admit: 2021-11-06 | Discharge: 2021-11-06 | Disposition: A | Discharge status: Home / Self Care | Payer: No Typology Code available for payment source | Attending: Emergency Medicine | Admitting: Emergency Medicine

## 2021-11-06 ENCOUNTER — Encounter (HOSPITAL_COMMUNITY): Payer: Self-pay

## 2021-11-06 DIAGNOSIS — B029 Zoster without complications: Secondary | ICD-10-CM | POA: Diagnosis not present

## 2021-11-06 MED ORDER — ONDANSETRON 4 MG PO TBDP
4.0000 mg | ORAL_TABLET | Freq: Three times a day (TID) | ORAL | 0 refills | Status: AC | PRN
Start: 2021-11-06 — End: 2021-11-09

## 2021-11-06 MED ORDER — VALACYCLOVIR HCL 1 G PO TABS
1000.0000 mg | ORAL_TABLET | Freq: Three times a day (TID) | ORAL | 0 refills | Status: AC
Start: 2021-11-06 — End: 2021-11-13

## 2021-11-06 MED ORDER — OXYCODONE-ACETAMINOPHEN 5-325 MG PO TABS
1.0000 | ORAL_TABLET | Freq: Four times a day (QID) | ORAL | 0 refills | Status: AC | PRN
Start: 2021-11-06 — End: 2021-11-09

## 2021-11-06 NOTE — Discharge Instructions (Signed)
Take at 1000 mg of Valtrex 3 times daily for the next 7 days. You can take Percocet as needed for pain.  You can pare Percocet with Zofran in order to avoid nausea. Please start stool softener in order to avoid constipation while taking Percocet.

## 2021-11-06 NOTE — ED Triage Notes (Signed)
Pt states she has shingles to her left neck and face.  Seen at [facility]D yest and was given Gabapentin but no antivirals was told to come to urgent care to get them.

## 2021-11-06 NOTE — ED Provider Notes (Signed)
Patient Contact: 5:59 PM (approximate)   History   Herpes Zoster   HPI  Jordan Mayer is a 51 y.o. female presents to the emergency department with a burning rash along left side of forehead, neck and scalp.  Patient states that she has had shingles in the past and it feels similar.  She is already seen her primary care provider who recommended no treatment.  Patient is concerned that she has not been started on antiretroviral and is experiencing severe pain.  She denies chest pain, chest tightness, abdominal pain or changes in vision.      Physical Exam   Triage Vital Signs: ED Triage Vitals  Enc Vitals Group     BP 11/06/21 1721 110/77     Pulse Rate 11/06/21 1721 87     Resp 11/06/21 1721 16     Temp 11/06/21 1721 98.2 F (36.8 C)     Temp Source 11/06/21 1721 Oral     SpO2 11/06/21 1721 97 %     Weight --      Height --      Head Circumference --      Peak Flow --      Pain Score 11/06/21 1724 6     Pain Loc --      Pain Edu? --      Excl. in GC? --     Most recent vital signs: Vitals:   11/06/21 1721  BP: 110/77  Pulse: 87  Resp: 16  Temp: 98.2 F (36.8 C)  SpO2: 97%     General: Alert and in no acute distress. Eyes:  PERRL. EOMI. Head: No acute traumatic findings ENT:      Nose: No congestion/rhinnorhea.      Mouth/Throat: Mucous membranes are moist. Neck: No stridor. No cervical spine tenderness to palpation. Cardiovascular:  Good peripheral perfusion Respiratory: Normal respiratory effort without tachypnea or retractions. Lungs CTAB. Good air entry to the bases with no decreased or absent breath sounds. Gastrointestinal: Bowel sounds 4 quadrants. Soft and nontender to palpation. No guarding or rigidity. No palpable masses. No distention. No CVA tenderness. Musculoskeletal: Full range of motion to all extremities.  Neurologic:  No gross focal neurologic deficits are appreciated.  Skin: Patient has a burning, erythematous rash of left  forehead and neck.  Rash is in a dermatomal distribution.   ED Results / Procedures / Treatments   Labs (all labs ordered are listed, but only abnormal results are displayed) Labs Reviewed - No data to display     PROCEDURES:  Critical Care performed: No  Procedures   MEDICATIONS ORDERED IN ED: Medications - No data to display   IMPRESSION / MDM / ASSESSMENT AND PLAN / ED COURSE  I reviewed the triage vital signs and the nursing notes.                              Assessment and plan Shingles 51 year old female presents to the urgent care with shingles.  I treated her with Valtrex and a short course of Percocet for pain.  Return precautions were given to return with new or worsening symptoms.     FINAL CLINICAL IMPRESSION(S) / ED DIAGNOSES   Final diagnoses:  Herpes zoster without complication     Rx / DC Orders   ED Discharge Orders          Ordered    valACYclovir (VALTREX) 1000 MG tablet  3  times daily        11/06/21 1754    oxyCODONE-acetaminophen (PERCOCET/ROXICET) 5-325 MG tablet  Every 6 hours PRN        11/06/21 1754    ondansetron (ZOFRAN-ODT) 4 MG disintegrating tablet  Every 8 hours PRN        11/06/21 1754             Note:  This document was prepared using Dragon voice recognition software and may include unintentional dictation errors.   Pia Mau [place], [place] 11/06/21 1802

## 2021-11-12 ENCOUNTER — Emergency Department (HOSPITAL_BASED_OUTPATIENT_CLINIC_OR_DEPARTMENT_OTHER): Payer: No Typology Code available for payment source

## 2021-11-12 ENCOUNTER — Other Ambulatory Visit (HOSPITAL_BASED_OUTPATIENT_CLINIC_OR_DEPARTMENT_OTHER): Payer: Self-pay

## 2021-11-12 ENCOUNTER — Emergency Department (HOSPITAL_BASED_OUTPATIENT_CLINIC_OR_DEPARTMENT_OTHER)
Admission: EM | Admit: 2021-11-12 | Discharge: 2021-11-12 | Disposition: A | Discharge status: Home / Self Care | Payer: No Typology Code available for payment source | Attending: Emergency Medicine | Admitting: Emergency Medicine

## 2021-11-12 ENCOUNTER — Encounter (HOSPITAL_BASED_OUTPATIENT_CLINIC_OR_DEPARTMENT_OTHER): Payer: Self-pay

## 2021-11-12 ENCOUNTER — Other Ambulatory Visit: Payer: Self-pay

## 2021-11-12 DIAGNOSIS — R519 Headache, unspecified: Secondary | ICD-10-CM | POA: Insufficient documentation

## 2021-11-12 DIAGNOSIS — R21 Rash and other nonspecific skin eruption: Secondary | ICD-10-CM | POA: Insufficient documentation

## 2021-11-12 LAB — CBC WITH DIFFERENTIAL/PLATELET
Abs Immature Granulocytes: 0.04 10*3/uL (ref 0.00–0.07)
Basophils Absolute: 0 10*3/uL (ref 0.0–0.1)
Basophils Relative: 0 %
Eosinophils Absolute: 0.5 10*3/uL (ref 0.0–0.5)
Eosinophils Relative: 7 %
HCT: 39.2 % (ref 36.0–46.0)
Hemoglobin: 13.1 g/dL (ref 12.0–15.0)
Immature Granulocytes: 1 %
Lymphocytes Relative: 23 %
Lymphs Abs: 1.6 10*3/uL (ref 0.7–4.0)
MCH: 27.9 pg (ref 26.0–34.0)
MCHC: 33.4 g/dL (ref 30.0–36.0)
MCV: 83.6 fL (ref 80.0–100.0)
Monocytes Absolute: 0.8 10*3/uL (ref 0.1–1.0)
Monocytes Relative: 11 %
Neutro Abs: 4 10*3/uL (ref 1.7–7.7)
Neutrophils Relative %: 58 %
Platelets: 226 10*3/uL (ref 150–400)
RBC: 4.69 MIL/uL (ref 3.87–5.11)
RDW: 12.8 % (ref 11.5–15.5)
WBC: 6.9 10*3/uL (ref 4.0–10.5)
nRBC: 0 % (ref 0.0–0.2)

## 2021-11-12 LAB — BASIC METABOLIC PANEL
Anion gap: 9 (ref 5–15)
BUN: 15 mg/dL (ref 6–20)
CO2: 25 mmol/L (ref 22–32)
Calcium: 8.6 mg/dL — ABNORMAL LOW (ref 8.9–10.3)
Chloride: 105 mmol/L (ref 98–111)
Creatinine, Ser: 0.92 mg/dL (ref 0.44–1.00)
GFR, Estimated: >60 mL/min (ref >60)
Glucose, Bld: 86 mg/dL (ref 70–99)
Potassium: 3.4 mmol/L — ABNORMAL LOW (ref 3.5–5.1)
Sodium: 139 mmol/L (ref 135–145)

## 2021-11-12 LAB — C-REACTIVE PROTEIN: CRP: 0.8 mg/dL (ref <1.0)

## 2021-11-12 LAB — SEDIMENTATION RATE: Sed Rate: 8 mm/hr (ref 0–22)

## 2021-11-12 MED ORDER — SODIUM CHLORIDE 0.9 % IV BOLUS
1000.0000 mL | Freq: Once | INTRAVENOUS | Status: AC
  Administered 2021-11-12: 1000 mL via INTRAVENOUS

## 2021-11-12 MED ORDER — DEXAMETHASONE 4 MG PO TABS
10.0000 mg | ORAL_TABLET | Freq: Once | ORAL | Status: AC
Start: 2021-11-12 — End: 2021-11-12
  Administered 2021-11-12: 10 mg via ORAL
  Filled 2021-11-12: qty 3

## 2021-11-12 MED ORDER — PREDNISONE 20 MG PO TABS
20.0000 mg | ORAL_TABLET | Freq: Every day | ORAL | 0 refills | Status: AC
  Filled 2021-11-12: qty 5, 5d supply, fill #0

## 2021-11-12 MED ORDER — PROCHLORPERAZINE EDISYLATE 10 MG/2ML IJ SOLN
10.0000 mg | Freq: Once | INTRAMUSCULAR | Status: AC
Start: 2021-11-12 — End: 2021-11-12
  Administered 2021-11-12: 10 mg via INTRAVENOUS
  Filled 2021-11-12: qty 2

## 2021-11-12 MED ORDER — DIPHENHYDRAMINE HCL 50 MG/ML IJ SOLN
12.5000 mg | Freq: Once | INTRAMUSCULAR | Status: AC
  Administered 2021-11-12: 12.5 mg via INTRAVENOUS
  Filled 2021-11-12: qty 1

## 2021-11-12 MED ORDER — ACETAMINOPHEN 325 MG PO TABS
650.0000 mg | ORAL_TABLET | Freq: Once | ORAL | Status: AC
  Administered 2021-11-12: 650 mg via ORAL
  Filled 2021-11-12: qty 2

## 2021-11-12 NOTE — ED Triage Notes (Signed)
Patient here POV from Home.  Endorses Headache that became on 06/13 associated with Neck Pain. A Few Days afterwards a Rash developed on Posterior Neck and Ears. Originally given Gabapentin by Physician and Rash continued to Facial Area.  Seen at UC recently and Gabapentin was discontinued and Patient was given Pain Medication and Antivirals.   Sent by Telemedicine Physician for Lab Specimen collection such as Sedimentation Rates. Endorses more recently Bilateral Eyes have become Heavy.   Subjective Fevers.   NAD Noted during Triage. A&Ox4. GCS 15. Ambulatory.

## 2021-11-12 NOTE — Discharge Instructions (Addendum)
Overall work-up is unremarkable today.  I have put in referral for rheumatology and neurology as discussed.  Continue 1000 mg of Tylenol every 6 hours as needed for headache.  Recommend 600 mg ibuprofen every 8 hours as needed for headache.  I prescribed steroid to help with rash and headache.  Take your next dose tomorrow.  Follow-up with your primary care doctor.

## 2021-11-12 NOTE — ED Provider Notes (Signed)
[facility] Provider Note   CSN: [id number] Arrival date & time: 11/12/21  1050     History  Chief Complaint  Patient presents with   Rash    Jordan Mayer is a 51 y.o. female.  Patient here with headache.  History of migraines.  History of kidney stones, depression.  Diagnosed with shingles recently and has been on antiviral.  Has also been on gabapentin for pain.  About 3 weeks ago she developed a rash from bilateral occiput around to her eyes.  That has improved.  She has been having intermittent rash underneath both her eyes however still recently.  She has been having headaches as well.  Seems different than her migraines.  No vision loss.  No numbness or weakness.  Denies any current fevers or chills.  No abdominal pain, shortness of breath, joint swelling, chest pain.  Rashes improved this morning.  She still continues with fairly bad headache.  The history is provided by the patient.       Home Medications Prior to Admission medications   Medication Sig Start Date End Date Taking? Authorizing Provider  predniSONE (DELTASONE) 20 MG tablet Take 1 tablet (20 mg total) by mouth daily for 5 days. 11/12/21 11/17/21 Yes Dafne Nield, DO  albuterol (VENTOLIN HFA) 108 (90 Base) MCG/ACT inhaler Inhale into the lungs as needed. 01/28/20   [provider]  EVEKEO 10 MG TABS Take by mouth. Take 2 tablets in the morning and 1 in the afternoon 06/15/20   [provider]  gabapentin (NEURONTIN) 100 MG capsule Take 1 capsule (100 mg total) by mouth 3 (three) times daily as needed. 11/05/21   Billie Ruddy, MD  [name] 24 FE 1-20 MG-MCG(24) tablet Take 1 tablet by mouth daily. 04/27/20   [provider]  Multiple Vitamin (MULTIVITAMIN) tablet Take 1 tablet by mouth daily.    [provider]  valACYclovir (VALTREX) 1000 MG tablet Take 1 tablet (1,000 mg total) by mouth 3 (three) times daily for 7 days. 11/06/21 11/13/21  Lannie Fields, PA-C  venlafaxine (EFFEXOR) 37.5 MG tablet Take 37.5 mg by mouth 2 (two) times daily.    [provider]      Allergies    Contrast media [iodinated contrast media], Iohexol, Penicillins, and Gadolinium derivatives    Review of Systems   Review of Systems  Physical Exam Updated Vital Signs BP 105/82   Pulse 73   Temp 98.2 F (36.8 C)   Resp 16   Ht '5\' 5"'  (1.651 m)   Wt 74.5 kg   LMP 10/16/2021 (Approximate)   SpO2 100%   BMI 27.33 kg/m  Physical Exam Vitals and nursing note reviewed.  Constitutional:      General: She is not in acute distress.    Appearance: She is well-developed. She is not ill-appearing.  HENT:     Head: Normocephalic and atraumatic.     Mouth/Throat:     Mouth: Mucous membranes are moist.  Eyes:     Extraocular Movements: Extraocular movements intact.     Conjunctiva/sclera: Conjunctivae normal.     Pupils: Pupils are equal, round, and reactive to light.  Cardiovascular:     Rate and Rhythm: Normal rate and regular rhythm.     Pulses: Normal pulses.     Heart sounds: Normal heart sounds. No murmur heard. Pulmonary:     Effort: Pulmonary effort is normal. No respiratory distress.     Breath sounds: Normal breath sounds.  Abdominal:     Palpations: Abdomen is soft.     Tenderness: There is no abdominal tenderness.  Musculoskeletal:        General: No swelling or tenderness. Normal range of motion.     Cervical back: Neck supple.  Skin:    General: Skin is warm and dry.     Capillary Refill: Capillary refill takes less than 2 seconds.     Comments: Very faint rash to both cheekbones, there is some dryness to the left ear  Neurological:     General: No focal deficit present.     Mental Status: She is alert and oriented to person, place, and time.     Cranial Nerves: No cranial nerve deficit.     Comments: 5+ out of 5 strength throughout, normal sensation, no drift, normal finger-nose-finger, normal speech, normal gait   Psychiatric:        Mood and Affect: Mood normal.     ED Results / Procedures / Treatments   Labs (all labs ordered are listed, but only abnormal results are displayed) Labs Reviewed  BASIC METABOLIC PANEL - Abnormal; Notable for the following components:      Result Value   Potassium 3.4 (*)    Calcium 8.6 (*)    All other components within normal limits  CBC WITH DIFFERENTIAL/PLATELET  SEDIMENTATION RATE  C-REACTIVE PROTEIN  ANTINUCLEAR ANTIBODIES, IFA    EKG None  Radiology CT Head Wo Contrast  Result Date: 11/12/2021 CLINICAL DATA:  Headache, new or worsening (Age >= 50y) EXAM: CT HEAD WITHOUT CONTRAST TECHNIQUE: Contiguous axial images were obtained from the base of the skull through the vertex without intravenous contrast. RADIATION DOSE REDUCTION: This exam was performed according to the departmental dose-optimization program which includes automated exposure control, adjustment of the mA and/or kV according to patient size and/or use of iterative reconstruction technique. COMPARISON:  CT head December 04, 2007. FINDINGS: Brain: No evidence of acute infarction, hemorrhage, hydrocephalus, extra-axial collection or mass lesion/mass effect. Partially empty sella. Vascular: No hyperdense vessel identified. Skull: No acute fracture. Sinuses/Orbits: Clear visualized sinuses. No acute orbital findings. Other: No mastoid effusions. IMPRESSION: 1. No evidence of acute intracranial abnormality. 2. Partially empty sella, which is often a normal anatomic variant but can be associated with idiopathic intracranial hypertension (pseudotumor cerebri). Electronically Signed   By: Margaretha Sheffield M.D.   On: 11/12/2021 12:22    Procedures Procedures    Medications Ordered in ED Medications  dexamethasone (DECADRON) tablet 10 mg (10 mg Oral Given 11/12/21 1243)  prochlorperazine (COMPAZINE) injection 10 mg (10 mg Intravenous Given 11/12/21 1239)  diphenhydrAMINE (BENADRYL) injection 12.5 mg  (12.5 mg Intravenous Given 11/12/21 1241)  acetaminophen (TYLENOL) tablet 650 mg (650 mg Oral Given 11/12/21 1242)  sodium chloride 0.9 % bolus 1,000 mL (1,000 mLs Intravenous New Bag/Given 11/12/21 1239)    ED Course/ Medical Decision Making/ A&P                           Medical Decision Making Amount and/or Complexity of Data Reviewed Labs: ordered. Radiology: ordered.  Risk OTC drugs. Prescription drug management.   Hendricks Limes is here with headache and rash.  History of kidney stones.  Headache started about 3 weeks ago with rash.  She has normal vitals.  She is on antiviral for shingles.  However rash appears to be bilateral from occiput to both cheeks of the face.  Rash appears very minimal now  just to the cheeks of the face.  Seems that this would be atypical for shingles infection.  Looks like may be malar rash and suspect may be some inflammatory/rheumatological process.  Her headache has been daily.  Not typical for migraines.  No vision loss.  No neurologic symptoms.  No meningitis signs on exam.  Normal neurological exam.  She has noticed some node/hives on some extremities.  Differential is possibly viral related rash but could be lupus or some other rheumatological/inflammatory process.  I have no concern for meningitis.  Headaches could be migraine or stress related.  However we will get a head CT to further evaluate for mass or bleed.  We will give headache cocktail with Benadryl, Compazine, Decadron.  We will check basic labs including CBC and BMP and help with outpatient work-up with an ESR, CRP, ANA.  CBC and BMP per my review and interpretation are unremarkable.  Head CT with no acute findings.  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  However her symptoms are consistent with pseudotumor cerebri but will have her follow-up with neurology regarding her headaches anyway.  We will put in referral for rheumatology, she does have follow-up with primary care doctor in place.  Will  prescribe steroids in the next few days to help with the rash.  Discharged in good condition.  Overall suspect inflammatory process/migraine type process.  This chart was dictated using voice recognition software.  Despite best efforts to proofread,  errors can occur which can change the documentation meaning.         Final Clinical Impression(s) / ED Diagnoses Final diagnoses:  Nonintractable headache, unspecified chronicity pattern, unspecified headache type  Rash    Rx / DC Orders ED Discharge Orders          Ordered    Ambulatory referral to Rheumatology        11/12/21 1201    Ambulatory referral to Neurology       Comments: An appointment is requested in approximately: 2 weeks   11/12/21 1250    predniSONE (DELTASONE) 20 MG tablet  Daily        11/12/21 1255              [place], DO 11/12/21 1343

## 2021-11-14 LAB — ANTINUCLEAR ANTIBODIES, IFA: ANA Ab, IFA: NEGATIVE

## 2021-11-24 ENCOUNTER — Ambulatory Visit: Payer: No Typology Code available for payment source | Admitting: Internal Medicine

## 2021-12-09 NOTE — Progress Notes (Unsigned)
Office Visit Note  Patient: Jordan Mayer             Date of Birth: [birth year]           MRN: [id number]             PCP: Burnis Medin, MD Referring: Lennice Sites, DO Visit Date: 12/10/2021 Occupation: _0 @  Subjective:  No chief complaint on file.   History of Present Illness: Jordan Mayer is a 51 y.o. female ***   Activities of Daily Living:  Patient reports morning stiffness for *** {minute/hour:19697}.   Patient {ACTIONS;DENIES/REPORTS:21021675::"Denies"} nocturnal pain.  Difficulty dressing/grooming: {ACTIONS;DENIES/REPORTS:21021675::"Denies"} Difficulty climbing stairs: {ACTIONS;DENIES/REPORTS:21021675::"Denies"} Difficulty getting out of chair: {ACTIONS;DENIES/REPORTS:21021675::"Denies"} Difficulty using hands for taps, buttons, cutlery, and/or writing: {ACTIONS;DENIES/REPORTS:21021675::"Denies"}  No Rheumatology ROS completed.   PMFS History:  Patient Active Problem List   Diagnosis Date Noted   Genetic testing 07/21/2021   Family history of melanoma 07/13/2021   Varicose veins of leg with complications 36/04/2448   Varicose veins of bilateral lower extremities with other complications 75/30/0511   Vertigo 12/23/2011   Otalgia of both ears 09/17/2010   SLEEP DISORDER/DISTURBANCE 09/27/2008   FATIGUE 07/15/2008   ADHD 12/05/2007   HEADACHE 11/26/2007   ALLERGIC RHINITIS 10/13/2007   NEPHROLITHIASIS, HX OF 10/13/2007   NONSPECIFIC MESENTERIC LYMPHADENITIS 03/17/2007   RENAL CALCULUS, LEFT 03/17/2007   DEPRESSION 03/07/2007   DYSPNEA ON EXERTION 03/07/2007    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    Shingles     Family History  Problem Relation  Age of Onset   Varicose Veins Mother    Asthma Mother    Varicose Veins Sister    Birth defects Paternal Aunt        Cognitively Challenged   Miscarriages / Stillbirths Paternal Aunt    Varicose Veins Paternal Aunt    Varicose Veins Paternal Aunt    Melanoma Paternal Aunt        melanoma x2   Arthritis Maternal Grandmother    Arthritis Paternal Grandfather    Hearing loss Paternal Grandfather    Prostate cancer Cousin    Colon cancer Neg Hx    Colon polyps Neg Hx    Esophageal cancer Neg Hx    Rectal cancer Neg Hx    Stomach cancer Neg Hx    Past Surgical History:  Procedure Laterality Date   BUNIONECTOMY Right    CHOLECYSTECTOMY     COLONOSCOPY     HIATAL HERNIA REPAIR     with gastric sleeve   LAPAROSCOPIC CHOLECYSTECTOMY SINGLE SITE WITH INTRAOPERATIVE CHOLANGIOGRAM N/A 06/14/2017   Procedure: LAPAROSCOPIC CHOLECYSTECTOMY SINGLE SITE;  Surgeon: Michael Boston, MD;  Location: [facility];  Service: General;  Laterality: N/A;   LAPAROSCOPIC GASTRIC SLEEVE RESECTION  09/2013   LASER ABLATION     Vascular   TONSILLECTOMY     WISDOM TOOTH EXTRACTION     Social History   Social History Narrative   7 hours of sleep per night   Lives with her husband and 2 kids   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   Physical Exam   Musculoskeletal Exam: ***  CDAI Exam: CDAI Score: -- Patient Global: --; Provider Global: -- Swollen: --; Tender: -- Joint Exam 12/10/2021   No joint exam has been documented for this visit   There is currently no information documented on the homunculus. Go to the Rheumatology activity and complete the homunculus joint exam.  Investigation: No additional  findings.  Imaging: CT Head Wo Contrast  Result Date: 11/12/2021 CLINICAL DATA:  Headache, new or worsening (Age >= 50y) EXAM: CT HEAD WITHOUT CONTRAST TECHNIQUE: Contiguous axial images were obtained from the base of the skull through the vertex without intravenous contrast. RADIATION DOSE REDUCTION: This exam was performed according to the departmental dose-optimization program which includes automated exposure control, adjustment of the mA and/or kV according to patient size and/or use of iterative reconstruction technique. COMPARISON:  CT head December 04, 2007. FINDINGS: Brain: No evidence of acute infarction, hemorrhage, hydrocephalus, extra-axial collection or mass lesion/mass effect. Partially empty sella. Vascular: No hyperdense vessel identified. Skull: No acute fracture. Sinuses/Orbits: Clear visualized sinuses. No acute orbital findings. Other: No mastoid effusions. IMPRESSION: 1. No evidence of acute intracranial abnormality. 2. Partially empty sella, which is often a normal anatomic variant but can be associated with idiopathic intracranial hypertension (pseudotumor cerebri). Electronically Signed   By: Margaretha Sheffield M.D.   On: 11/12/2021 12:22    Recent Labs: Lab Results  Component Value Date   WBC 6.9 11/12/2021   HGB 13.1 11/12/2021   PLT 226 11/12/2021   NA 139 11/12/2021   K 3.4 (L) 11/12/2021   CL 105 11/12/2021   CO2 25 11/12/2021   GLUCOSE 86 11/12/2021   BUN 15 11/12/2021   CREATININE 0.92 11/12/2021   BILITOT 0.4 06/02/2021   ALKPHOS 60 06/02/2021   AST 25 06/02/2021   ALT 24 06/02/2021   PROT 7.0 06/02/2021   ALBUMIN 4.2 06/02/2021   CALCIUM 8.6 (L) 11/12/2021   GFRAA >60 08/15/2019    Speciality Comments: No specialty comments available.  Procedures:  No procedures performed Allergies: Contrast media [iodinated contrast media], Iohexol, Penicillins, and Gadolinium derivatives   Assessment / Plan:     Visit Diagnoses: Positive ANA (antinuclear antibody) -  07/22/21: ANA positive, no titer.  11/12/21: ANA negative.  CRP and ESR WNL.   Varicose veins of bilateral lower extremities with other complications  History of nephrolithiasis  Other fatigue  History of depression  History of ADHD  History of PCOS  S/P gastric sleeve procedure - 2015  Orders: No orders of the defined types were placed in this encounter.  No orders of the defined types were placed in this encounter.   Face-to-face time spent with patient was *** minutes. Greater than 50% of time was spent in counseling and coordination of care.  Follow-Up Instructions: No follow-ups on file.   Ofilia Neas, PA-C  Note - This record has been created using Dragon software.  Chart creation errors have been sought, but may not always  have been located. Such creation errors do not reflect on  the standard of medical care.

## 2021-12-10 ENCOUNTER — Encounter: Payer: Self-pay | Admitting: Rheumatology

## 2021-12-10 ENCOUNTER — Ambulatory Visit: Payer: No Typology Code available for payment source | Admitting: Rheumatology

## 2021-12-10 VITALS — BP 118/85 | HR 96 | Resp 12 | Ht 65.5 in | Wt 166.0 lb

## 2021-12-10 DIAGNOSIS — Z87442 Personal history of urinary calculi: Secondary | ICD-10-CM

## 2021-12-10 DIAGNOSIS — H04123 Dry eye syndrome of bilateral lacrimal glands: Secondary | ICD-10-CM

## 2021-12-10 DIAGNOSIS — R768 Other specified abnormal immunological findings in serum: Secondary | ICD-10-CM | POA: Diagnosis not present

## 2021-12-10 DIAGNOSIS — I83893 Varicose veins of bilateral lower extremities with other complications: Secondary | ICD-10-CM

## 2021-12-10 DIAGNOSIS — R5383 Other fatigue: Secondary | ICD-10-CM

## 2021-12-10 DIAGNOSIS — R682 Dry mouth, unspecified: Secondary | ICD-10-CM

## 2021-12-10 DIAGNOSIS — M2550 Pain in unspecified joint: Secondary | ICD-10-CM | POA: Diagnosis not present

## 2021-12-10 DIAGNOSIS — Z8742 Personal history of other diseases of the female genital tract: Secondary | ICD-10-CM

## 2021-12-10 DIAGNOSIS — E559 Vitamin D deficiency, unspecified: Secondary | ICD-10-CM

## 2021-12-10 DIAGNOSIS — Z8269 Family history of other diseases of the musculoskeletal system and connective tissue: Secondary | ICD-10-CM

## 2021-12-10 DIAGNOSIS — Z903 Acquired absence of stomach [part of]: Secondary | ICD-10-CM

## 2021-12-10 DIAGNOSIS — Z8659 Personal history of other mental and behavioral disorders: Secondary | ICD-10-CM

## 2021-12-15 HISTORY — PX: NASAL SINUS SURGERY: SHX719

## 2021-12-18 LAB — ANTI-SCLERODERMA ANTIBODY: Scleroderma (Scl-70) (ENA) Antibody, IgG: <1 AI

## 2021-12-18 LAB — COMPLETE METABOLIC PANEL WITH GFR
AG Ratio: 1.4 (calc) (ref 1.0–2.5)
ALT: 11 U/L (ref 6–29)
AST: 13 U/L (ref 10–35)
Albumin: 4.2 g/dL (ref 3.6–5.1)
Alkaline phosphatase (APISO): 69 U/L (ref 37–153)
BUN/Creatinine Ratio: 15 (calc) (ref 6–22)
BUN: 18 mg/dL (ref 7–25)
CO2: 25 mmol/L (ref 20–32)
Calcium: 9.4 mg/dL (ref 8.6–10.4)
Chloride: 104 mmol/L (ref 98–110)
Creat: 1.2 mg/dL — ABNORMAL HIGH (ref 0.50–1.03)
Globulin: 2.9 g/dL (calc) (ref 1.9–3.7)
Glucose, Bld: 90 mg/dL (ref 65–99)
Potassium: 4.1 mmol/L (ref 3.5–5.3)
Sodium: 138 mmol/L (ref 135–146)
Total Bilirubin: 0.4 mg/dL (ref 0.2–1.2)
Total Protein: 7.1 g/dL (ref 6.1–8.1)
eGFR: 55 mL/min/{1.73_m2} — ABNORMAL LOW (ref >=60)

## 2021-12-18 LAB — TSH: TSH: 3.62 mIU/L

## 2021-12-18 LAB — PROTEIN / CREATININE RATIO, URINE
Creatinine, Urine: 350 mg/dL — ABNORMAL HIGH (ref 20–275)
Protein/Creat Ratio: 71 mg/g creat (ref 24–184)
Protein/Creatinine Ratio: 0.071 mg/mg creat (ref 0.024–0.184)
Total Protein, Urine: 25 mg/dL — ABNORMAL HIGH (ref 5–24)

## 2021-12-18 LAB — BETA-2 GLYCOPROTEIN ANTIBODIES
Beta-2 Glyco 1 IgA: <2 U/mL (ref <20.0)
Beta-2 Glyco 1 IgM: <2 U/mL (ref <20.0)
Beta-2 Glyco I IgG: <2 U/mL (ref <20.0)

## 2021-12-18 LAB — CBC WITH DIFFERENTIAL/PLATELET
Absolute Monocytes: 593 cells/uL (ref 200–950)
Basophils Absolute: 21 cells/uL (ref 0–200)
Basophils Relative: 0.4 %
Eosinophils Absolute: 73 cells/uL (ref 15–500)
Eosinophils Relative: 1.4 %
HCT: 41.9 % (ref 35.0–45.0)
Hemoglobin: 14 g/dL (ref 11.7–15.5)
Lymphs Abs: 1056 cells/uL (ref 850–3900)
MCH: 28.7 pg (ref 27.0–33.0)
MCHC: 33.4 g/dL (ref 32.0–36.0)
MCV: 86 fL (ref 80.0–100.0)
MPV: 9.1 fL (ref 7.5–12.5)
Monocytes Relative: 11.4 %
Neutro Abs: 3458 cells/uL (ref 1500–7800)
Neutrophils Relative %: 66.5 %
Platelets: 295 10*3/uL (ref 140–400)
RBC: 4.87 10*6/uL (ref 3.80–5.10)
RDW: 13.3 % (ref 11.0–15.0)
Total Lymphocyte: 20.3 %
WBC: 5.2 10*3/uL (ref 3.8–10.8)

## 2021-12-18 LAB — ANTI-DNA ANTIBODY, DOUBLE-STRANDED: ds DNA Ab: 3 IU/mL

## 2021-12-18 LAB — SJOGRENS SYNDROME-B EXTRACTABLE NUCLEAR ANTIBODY: SSB (La) (ENA) Antibody, IgG: <1 AI

## 2021-12-18 LAB — CARDIOLIPIN ANTIBODIES, IGG, IGM, IGA
Anticardiolipin IgA: <2 APL-U/mL (ref <20.0)
Anticardiolipin IgG: <2 GPL-U/mL (ref <20.0)
Anticardiolipin IgM: <2 MPL-U/mL (ref <20.0)

## 2021-12-18 LAB — CK: Total CK: 37 U/L (ref 29–143)

## 2021-12-18 LAB — C3 AND C4
C3 Complement: 188 mg/dL (ref 83–193)
C4 Complement: 36 mg/dL (ref 15–57)

## 2021-12-18 LAB — ANTI-SMITH ANTIBODY: ENA SM Ab Ser-aCnc: <1 AI

## 2021-12-18 LAB — ANTI-NUCLEAR AB-TITER (ANA TITER): ANA Titer 1: 1:40 {titer} — ABNORMAL HIGH

## 2021-12-18 LAB — VITAMIN D 25 HYDROXY (VIT D DEFICIENCY, FRACTURES): Vit D, 25-Hydroxy: 28 ng/mL — ABNORMAL LOW (ref 30–100)

## 2021-12-18 LAB — ANA: Anti Nuclear Antibody (ANA): POSITIVE — AB

## 2021-12-18 LAB — RNP ANTIBODY: Ribonucleic Protein(ENA) Antibody, IgG: <1 AI

## 2021-12-18 LAB — SJOGRENS SYNDROME-A EXTRACTABLE NUCLEAR ANTIBODY: SSA (Ro) (ENA) Antibody, IgG: <1 AI

## 2021-12-18 LAB — RHEUMATOID FACTOR: Rheumatoid fact SerPl-aCnc: <14 IU/mL (ref <14)

## 2021-12-18 NOTE — Progress Notes (Signed)
Creatinine is elevated.  Patient should avoid use of all NSAIDs.  Vitamin D is low.  She should take vitamin D 2000 units daily.  I will discuss the lab results in detail at the follow-up visit.  Please forward results to her PCP.

## 2021-12-20 NOTE — Progress Notes (Signed)
Office Visit Note  Patient: Jordan Mayer             Date of Birth: Aug 16, 1970           MRN: [id number]             PCP: Burnis Medin, MD Referring: Lennice Sites, DO Visit Date: 12/30/2021 Occupation: '@GUAROCC'$ @  Subjective:  Positive ANA, joint pain and rash  History of Present Illness: Jordan Mayer is a 51 y.o. female recently evaluated for positive ANA.  As she gives history of photosensitivity and rash on her face and neck.  She gives history of joint pain and discomfort off and on.  She continues to have dry mouth and dry eyes.  She has not noticed any joint swelling.  She complains of discomfort in multiple joints including shoulders, hands, ankles and hips and lower back.  She continues to have fatigue.  There is history of Sjogren's in her sister.  Activities of Daily Living:  Patient reports morning stiffness for several hours.   Patient Reports nocturnal pain.  Difficulty dressing/grooming: Denies Difficulty climbing stairs: Reports Difficulty getting out of chair: Reports Difficulty using hands for taps, buttons, cutlery, and/or writing: Denies  Review of Systems  Constitutional:  Positive for fatigue.  HENT:  Positive for mouth dryness.   Eyes:  Positive for photophobia and dryness.  Respiratory:  Negative for difficulty breathing.   Cardiovascular:  Negative for chest pain and palpitations.  Gastrointestinal:  Positive for constipation and diarrhea. Negative for blood in stool.  Endocrine: Negative for increased urination.  Genitourinary:  Negative for involuntary urination.  Musculoskeletal:  Positive for joint pain, joint pain and morning stiffness. Negative for gait problem, joint swelling, myalgias, muscle weakness, muscle tenderness and myalgias.  Skin:  Positive for hair loss and sensitivity to sunlight. Negative for color change and rash.  Allergic/Immunologic: Negative for susceptible to infections.  Neurological:  Positive for dizziness and  headaches.  Hematological:  Negative for swollen glands.  Psychiatric/Behavioral:  Positive for sleep disturbance. Negative for depressed mood. The patient is nervous/anxious.     PMFS History:  Patient Active Problem List   Diagnosis Date Noted   Genetic testing 07/21/2021   Family history of melanoma 07/13/2021   Varicose veins of leg with complications 05/19/7251   Varicose veins of bilateral lower extremities with other complications 66/44/0347   Vertigo 12/23/2011   Otalgia of both ears 09/17/2010   SLEEP DISORDER/DISTURBANCE 09/27/2008   FATIGUE 07/15/2008   ADHD 12/05/2007   HEADACHE 11/26/2007   ALLERGIC RHINITIS 10/13/2007   NEPHROLITHIASIS, HX OF 10/13/2007   NONSPECIFIC MESENTERIC LYMPHADENITIS 03/17/2007   RENAL CALCULUS, LEFT 03/17/2007   DEPRESSION 03/07/2007   DYSPNEA ON EXERTION 03/07/2007    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    Shingles     Family History  Problem Relation Age of Onset   Varicose Veins Mother    Asthma Mother    Varicose Veins Sister    Birth defects Paternal Aunt        Cognitively Challenged  Miscarriages / Stillbirths Paternal Aunt    Varicose Veins Paternal Aunt    Varicose Veins Paternal Aunt    Melanoma Paternal Aunt        melanoma x2   Arthritis Maternal Grandmother    Arthritis Paternal Grandfather    Hearing loss Paternal Grandfather    Prostate cancer Cousin    Healthy Son    Healthy Son    Colon cancer Neg Hx    Colon polyps Neg Hx    Esophageal cancer Neg Hx    Rectal cancer Neg Hx    Stomach cancer Neg Hx    Past Surgical History:  Procedure Laterality Date   BUNIONECTOMY Right    CHOLECYSTECTOMY      COLONOSCOPY     HIATAL HERNIA REPAIR     with gastric sleeve   LAPAROSCOPIC CHOLECYSTECTOMY SINGLE SITE WITH INTRAOPERATIVE CHOLANGIOGRAM N/A 06/14/2017   Procedure: LAPAROSCOPIC CHOLECYSTECTOMY SINGLE SITE;  Surgeon: Michael Boston, MD;  Location: [facility];  Service: General;  Laterality: N/A;   [place] RESECTION  09/2013   LASER ABLATION     Vascular   TONSILLECTOMY     WISDOM TOOTH EXTRACTION     Social History   Social History Narrative   7 hours of sleep per night   Lives with her husband and 2 kids   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  Constitutional:      Appearance: She is well-developed.  HENT:     Head: Normocephalic and atraumatic.  Eyes:     Conjunctiva/sclera: Conjunctivae normal.  Cardiovascular:     Rate and Rhythm: Normal rate and regular rhythm.     Heart sounds: Normal heart sounds.  Pulmonary:     Effort: Pulmonary effort is normal.     Breath sounds: Normal breath sounds.  Abdominal:     General: Bowel sounds are normal.     Palpations: Abdomen is soft.  Musculoskeletal:     Cervical back: Normal range of motion.  Lymphadenopathy:     Cervical: No cervical adenopathy.  Skin:    General: Skin is warm and dry.     Capillary Refill: Capillary refill takes less than 2 seconds.     Comments: No nailbed capillary changes, Telengectesia or sclerodactyly was noted.  Neurological:     Mental Status: She is alert and oriented to person, place, and time.  Psychiatric:        Behavior:  Behavior normal.      Musculoskeletal Exam: Cervical, thoracic and lumbar spine with good range of motion.  Shoulder joints, elbow joints, wrist joints, MCPs PIPs and DIPs with good range of motion with no synovitis.  Hip joints, knee joints, ankles, MTPs and PIPs with good range of motion with no synovitis.  CDAI Exam: CDAI Score: -- Patient Global: --; Provider Global: -- Swollen: --; Tender: -- Joint Exam 12/30/2021   No joint exam has been documented for this visit   There is currently no information  documented on the homunculus. Go to the Rheumatology activity and complete the homunculus joint exam.  Investigation: No additional findings.  Imaging: No results found.  Recent Labs: Lab Results  Component Value Date   WBC 5.2 12/10/2021   HGB 14.0 12/10/2021   PLT 295 12/10/2021   NA 138 12/10/2021   K 4.1 12/10/2021   CL 104 12/10/2021   CO2 25 12/10/2021   GLUCOSE 90 12/10/2021   BUN 18 12/10/2021   CREATININE 1.20 (H) 12/10/2021   BILITOT 0.4 12/10/2021   ALKPHOS 60 06/02/2021   AST 13 12/10/2021   ALT 11 12/10/2021   PROT 7.1 12/10/2021   ALBUMIN 4.2 06/02/2021   CALCIUM 9.4 12/10/2021   GFRAA >60 08/15/2019   December 10, 2021 urine protein creatinine ratio normal, ANA 1: 40NH, ENA negative, beta-2 GP 1 negative, anticardiolipin negative, C3-C4 normal, RF negative, CK 37, TSH normal, vitamin D 28  Speciality Comments: No specialty comments available.  Procedures:  No procedures performed Allergies: Contrast media [iodinated contrast media], Iohexol, Penicillins, and Gadolinium derivatives   Assessment / Plan:     Visit Diagnoses: Positive ANA (antinuclear antibody) - December 10, 2021 urine protein creatinine ratio normal, ANA 1: 40NH, ENA negative, beta-2 GP 1 negative, anticardiolipin negative, C3-C4 normal, RF negative, History of erythematous pruritic rash on her face and her neck.  H/o periorbital swelling, fatigue, arthralgias.  All autoimmune immune labs  were negative.  Left findings were discussed with the patient at length.  She gives history of photosensitivity fatigue and arthralgias.  She has not had any recent rash on her face and her neck.  Advised patient to contact me if she develops any new symptoms.  She was in agreement.  At this point she would like to return as needed basis.  Polyarthralgia - History of pain in multiple joints including shoulders, hands, hips, ankles and lower back.  No synovitis was noted.  All autoimmune work-up negative.  Sicca complex ([place]) - She gives history of dry mouth and dry eyes.  It could be related to the medication use.  I did detailed discussion regarding over-the-counter products.  There is history of Sjogren's in her sister.  Elevated serum creatinine-elevated creatinine was noted at 1.20 with GFR 55.  She had previously elevated creatinine in January 2023 and in March 2021.  It could be related to spironolactone.  I advised her to discuss this further with Dr. Regis Bill.  If her creatinine is elevated after stopping spironolactone she may need nephrology evaluation.  Urine protein creatinine ratio was normal.  Vitamin D deficiency-her vitamin D was low at 28.  I advised her to take vitamin D 2000 units daily and repeat vitamin D level with Dr. Regis Bill in 6 months.  Other fatigue-could be related to vitamin D deficiency.  Other medical problems are listed as follows:  S/P gastric sleeve procedure  Varicose veins of bilateral lower extremities with other complications  History of nephrolithiasis  History of ADHD  History of depression  History of PCOS  Family history of Sjogren's disease-Sister  Orders: No orders of the defined types were placed in this encounter.  No orders of the defined types were placed in this encounter.    Follow-Up Instructions: Return if symptoms worsen or fail to improve, for sicca.   Bo Merino, MD  Note - This record has been created using Radio producer.  Chart creation errors have been sought, but may not always  have been located. Such creation errors do  not reflect on  the standard of medical care.

## 2021-12-30 ENCOUNTER — Ambulatory Visit: Payer: 59 | Attending: Rheumatology | Admitting: Rheumatology

## 2021-12-30 ENCOUNTER — Encounter: Payer: Self-pay | Admitting: Rheumatology

## 2021-12-30 VITALS — BP 125/89 | HR 96 | Ht 65.5 in | Wt 163.4 lb

## 2021-12-30 DIAGNOSIS — M3500 Sicca syndrome, unspecified: Secondary | ICD-10-CM

## 2021-12-30 DIAGNOSIS — E559 Vitamin D deficiency, unspecified: Secondary | ICD-10-CM

## 2021-12-30 DIAGNOSIS — R768 Other specified abnormal immunological findings in serum: Secondary | ICD-10-CM | POA: Diagnosis not present

## 2021-12-30 DIAGNOSIS — Z87442 Personal history of urinary calculi: Secondary | ICD-10-CM

## 2021-12-30 DIAGNOSIS — Z8269 Family history of other diseases of the musculoskeletal system and connective tissue: Secondary | ICD-10-CM

## 2021-12-30 DIAGNOSIS — Z8742 Personal history of other diseases of the female genital tract: Secondary | ICD-10-CM

## 2021-12-30 DIAGNOSIS — M2550 Pain in unspecified joint: Secondary | ICD-10-CM | POA: Diagnosis not present

## 2021-12-30 DIAGNOSIS — Z903 Acquired absence of stomach [part of]: Secondary | ICD-10-CM

## 2021-12-30 DIAGNOSIS — Z8659 Personal history of other mental and behavioral disorders: Secondary | ICD-10-CM

## 2021-12-30 DIAGNOSIS — R7989 Other specified abnormal findings of blood chemistry: Secondary | ICD-10-CM

## 2021-12-30 DIAGNOSIS — I83893 Varicose veins of bilateral lower extremities with other complications: Secondary | ICD-10-CM

## 2021-12-30 DIAGNOSIS — R5383 Other fatigue: Secondary | ICD-10-CM

## 2022-01-04 ENCOUNTER — Encounter: Payer: Self-pay | Admitting: Psychiatry

## 2022-01-04 ENCOUNTER — Ambulatory Visit: Payer: 59 | Admitting: Psychiatry

## 2022-01-04 ENCOUNTER — Telehealth: Payer: Self-pay | Admitting: Psychiatry

## 2022-01-04 VITALS — BP 118/85 | HR 95 | Ht 66.0 in | Wt 166.0 lb

## 2022-01-04 DIAGNOSIS — G43009 Migraine without aura, not intractable, without status migrainosus: Secondary | ICD-10-CM | POA: Diagnosis not present

## 2022-01-04 DIAGNOSIS — R519 Headache, unspecified: Secondary | ICD-10-CM

## 2022-01-04 DIAGNOSIS — H538 Other visual disturbances: Secondary | ICD-10-CM | POA: Diagnosis not present

## 2022-01-04 MED ORDER — PROPRANOLOL HCL 20 MG PO TABS
20.0000 mg | ORAL_TABLET | Freq: Two times a day (BID) | ORAL | 6 refills | Status: DC
Start: 2022-01-04 — End: 2022-02-03

## 2022-01-04 MED ORDER — ONDANSETRON 8 MG PO TBDP: 8.0000 mg | ORAL_TABLET | Freq: Three times a day (TID) | ORAL | 0 refills | Status: DC | PRN

## 2022-01-04 MED ORDER — NURTEC 75 MG PO TBDP: 75.0000 mg | ORAL_TABLET | ORAL | 6 refills | Status: DC | PRN

## 2022-01-04 MED ORDER — LORAZEPAM 0.5 MG PO TABS: ORAL_TABLET | ORAL | 0 refills | Status: DC

## 2022-01-04 NOTE — Patient Instructions (Addendum)
Plan:  Start propranolol 20 mg twice a day for headache prevention  Start Nurtec as needed for migraines. Take one pill at onset of migraine. Max dose 1 pill in 24 hours  Take Zofran every 8 hours as needed for nausea  Referral to ophthalmology   MRI brain

## 2022-01-04 NOTE — Telephone Encounter (Signed)
Sent to Dr. Groat ph # [phone] 

## 2022-01-04 NOTE — Progress Notes (Signed)
Referring:  Lennice Sites, DO [address],  [place] [address]  PCP: Burnis Medin, MD  Neurology was asked to evaluate Jordan Mayer, a 51 year old female for a chief complaint of headaches.  Our recommendations of care will be communicated by shared medical record.    CC:  headaches  History provided from self  HPI:  Medical co-morbidities: nephrolithiasis, depression, PCOS, migraines  The patient presents for evaluation of headaches which began several years ago, but have been worsening over the past 5 years. Currently she has a headache every day. Has multiple different types of headaches including stabbing headaches from her occiput to behind her eye, holocephalic pressure, and throbbing headaches. Her worst headaches are associated with photophobia, phonophobia, and nausea. Has had some intermittent blurred vision but has not noticed if this has been associated with headaches or not.Takes Tylenol as needed, which she is taking every day. It takes the edge off but does not resolve the headache. Denies positional component or pulsatile tinnitus.   She presented to the ED 11/12/21 for headaches and facial rash, where [facility] showed a partially empty sella and was otherwise unremarkable.  She recently saw Rheumatology for positive ANA. Other autoimmune labs were negative. She is currently being monitored off of medication.  Headache History: Onset: several years Triggers: changes in weather Aura: blurry Location: occipital, temples Quality/Description: squeezing, stabbing, throbbing, pressure Associated Symptoms:  Photophobia: yes  Phonophobia: yes  Nausea: yes, takes Zofran as needed Worse with activity?: yes Duration of headaches: 3 days  Headache days per month: 30 Headache free days per month: 0  Current Treatment: Abortive Tylenol  Preventative none  Prior Therapies                                 Tylenol Imitrex 50-100 mg PRN - side effects Maxalt 10 mg  PRN - side effects Effexor Topamax - lack of efficacy, kidney stones Neck PT  LABS: CBC    Component Value Date/Time   WBC 5.2 12/10/2021 1128   RBC 4.87 12/10/2021 1128   HGB 14.0 12/10/2021 1128   HCT 41.9 12/10/2021 1128   PLT 295 12/10/2021 1128   MCV 86.0 12/10/2021 1128   MCH 28.7 12/10/2021 1128   MCHC 33.4 12/10/2021 1128   RDW 13.3 12/10/2021 1128   LYMPHSABS 1,056 12/10/2021 1128   MONOABS 0.8 11/12/2021 1236   EOSABS 73 12/10/2021 1128   BASOSABS 21 12/10/2021 1128      Latest Ref Rng & Units 12/10/2021   11:28 AM 11/12/2021   12:36 PM 06/02/2021    9:48 PM  CMP  Glucose 65 - 99 mg/dL 90  86  92   BUN 7 - 25 mg/dL '18  15  14   '$ Creatinine 0.50 - 1.03 mg/dL 1.20  0.92  1.24   Sodium 135 - 146 mmol/L 138  139  137   Potassium 3.5 - 5.3 mmol/L 4.1  3.4  3.7   Chloride 98 - 110 mmol/L 104  105  105   CO2 20 - 32 mmol/L '25  25  22   '$ Calcium 8.6 - 10.4 mg/dL 9.4  8.6  9.0   Total Protein 6.1 - 8.1 g/dL 7.1   7.0   Total Bilirubin 0.2 - 1.2 mg/dL 0.4   0.4   Alkaline Phos 38 - 126 U/L   60   AST 10 - 35 U/L 13  25   ALT 6 - 29 U/L 11   24      IMAGING:  [facility] 11/12/21: partially empty sella, otherwise unremarkable  Imaging independently reviewed on January 04, 2022   Current Outpatient Medications on File Prior to Visit  Medication Sig Dispense Refill   albuterol (VENTOLIN HFA) 108 (90 Base) MCG/ACT inhaler Inhale into the lungs as needed.     Cholecalciferol (D3 PO) Take by mouth. 2000 mg daily     EVEKEO 10 MG TABS Take by mouth. Take 2 tablets in the morning and 1 in the afternoon     fluticasone (FLONASE) 50 MCG/ACT nasal spray Place 1 spray into both nostrils daily.     [name] 24 FE 1-20 MG-MCG(24) tablet Take 1 tablet by mouth daily.     Multiple Vitamin (MULTIVITAMIN) tablet Take 1 tablet by mouth daily.     spironolactone (ALDACTONE) 100 MG tablet Take 100 mg by mouth 2 (two) times daily.     traZODone (DESYREL) 100 MG tablet Take 100 mg by mouth at  bedtime.     venlafaxine (EFFEXOR) 37.5 MG tablet Take 37.5 mg by mouth 2 (two) times daily.     gabapentin (NEURONTIN) 100 MG capsule Take 1 capsule (100 mg total) by mouth 3 (three) times daily as needed. (Patient not taking: Reported on 12/10/2021) 30 capsule 0   No current facility-administered medications on file prior to visit.     Allergies: Allergies  Allergen Reactions   Contrast Media [Iodinated Contrast Media]    Iohexol      Code: HIVES, Desc: 1 hive developed on hand post injection of 125cc's Omni 300., Onset Date: [id number]    Penicillins Other (See Comments)    Has patient had a PCN reaction causing immediate rash, facial/tongue/throat swelling, SOB or lightheadedness with hypotension: Yes Has patient had a PCN reaction causing severe rash involving mucus membranes or skin necrosis: No Has patient had a PCN reaction that required hospitalization: No Has patient had a PCN reaction occurring within the last 10 years: No If all of the above answers are "NO", then may proceed with Cephalosporin use.   Gadolinium Derivatives Hives, Itching and Rash    Family History: Family History  Problem Relation Age of Onset   Varicose Veins Mother    Asthma Mother    Varicose Veins Sister    Birth defects Paternal Aunt        Cognitively Challenged   Miscarriages / Stillbirths Paternal Aunt    Varicose Veins Paternal Aunt    Varicose Veins Paternal Aunt    Melanoma Paternal Aunt        melanoma x2   Arthritis Maternal Grandmother    Arthritis Paternal Grandfather    Hearing loss Paternal Grandfather    Prostate cancer Cousin    Healthy Son    Healthy Son    Colon cancer Neg Hx    Colon polyps Neg Hx    Esophageal cancer Neg Hx    Rectal cancer Neg Hx    Stomach cancer Neg Hx     Past Medical History: Past Medical History:  Diagnosis Date   ADD (attention deficit disorder)    Allergy    Anxiety    Asthma    Chicken pox    Chronic headaches    Depression     Family history of melanoma    Gallstones    Kidney stones    PCOS (polycystic ovarian syndrome)    Pneumonia    Renal  disorder    kidney disease   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    Shingles     Past Surgical History Past Surgical History:  Procedure Laterality Date   BUNIONECTOMY Right    CHOLECYSTECTOMY     COLONOSCOPY     HIATAL HERNIA REPAIR     with gastric sleeve   LAPAROSCOPIC CHOLECYSTECTOMY SINGLE SITE WITH INTRAOPERATIVE CHOLANGIOGRAM N/A 06/14/2017   Procedure: LAPAROSCOPIC CHOLECYSTECTOMY SINGLE SITE;  Surgeon: Michael Boston, MD;  Location: [facility];  Service: General;  Laterality: N/A;   [place] RESECTION  09/2013   LASER ABLATION     Vascular   TONSILLECTOMY     WISDOM TOOTH EXTRACTION      Social History: Social History   Tobacco Use   Smoking status: Never    Passive exposure: Never   Smokeless tobacco: Never  Vaping Use   Vaping Use: Never used  Substance Use Topics   Alcohol use: Yes    Alcohol/week: 0.0 standard drinks of alcohol    Comment: occasional, 1-2 monthly   Drug use: No    ROS: Negative for fevers, chills. Positive for headaches, blurred vision. All other systems reviewed and negative unless stated otherwise in HPI.   Physical Exam:   Vital Signs: BP 118/85   Pulse 95   Ht '5\' 6"'$  (1.676 m)   Wt 166 lb (75.3 kg)   BMI 26.79 kg/m  GENERAL: well appearing,in no acute distress,alert SKIN:  Color, texture, turgor normal. No rashes or lesions HEAD:  Normocephalic/atraumatic. CV:  RRR RESP: Normal respiratory effort MSK: +tenderness to palpation over occiput, neck, and shoulders  NEUROLOGICAL: Mental Status: Alert, oriented to person, place and time,Follows commands Cranial Nerves: PERRL, optic discs sharp, visual fields intact to confrontation, extraocular movements intact, facial sensation intact, no facial droop or ptosis, hearing grossly intact, no dysarthria Motor: muscle strength 5/5 both upper and lower  extremities,no drift, normal tone Reflexes: 2+ throughout Sensation: intact to light touch all 4 extremities Coordination: Finger-to- nose-finger intact bilaterally Gait: normal-based   IMPRESSION: 51 year old female with a history of nephrolithiasis, depression, PCOS, migraines who presents for evaluation of worsening headaches. MRI brain ordered for worsening headaches in patient >50. CTH showed an empty sella. Suspect this is an incidental finding as she does not endorse symptoms of IIH and optic discs look sharp on today's exam. However she remains concerned about blurred vision, which is new for her. Will refer to ophthalmology for fundus exam and to evaluate for ocular causes of blurred vision. Headaches are most consistent with migraines and she likely has an additional component of medication overuse headache. Counseled on limiting OTCs. Will start propranolol for migraine prevention and Nurtec for rescue.  PLAN: -MRI Brain -Prevention: Start propranolol 20 mg BID -Rescue: Start Nurtec 75 mg PRN -Counseled on limiting OTCs to avoid medication overuse headache -Referral to Ophthalmology for blurred vision -next steps: consider CGRP, Botox, qulipta for prevention   I spent a total of 41 minutes chart reviewing and counseling the patient. Headache education was done. Discussed treatment options including preventive and acute medications, and physical therapy. Discussed medication overuse headache and to limit use of acute treatments to no more than 2 days/week or 10 days/month. Discussed medication side effects, adverse reactions and drug interactions. Written educational materials and patient instructions outlining all of the above were given.  Follow-up: 3 months   Genia Harold, MD 01/04/2022   1:32 PM

## 2022-01-13 ENCOUNTER — Telehealth: Payer: Self-pay | Admitting: *Deleted

## 2022-01-13 ENCOUNTER — Telehealth: Payer: Self-pay | Admitting: Psychiatry

## 2022-01-13 NOTE — Telephone Encounter (Signed)
Pt is scheduled for MRI on 9/6 and is requesting medication prior to MRI because she is claustrophobic

## 2022-01-13 NOTE — Telephone Encounter (Signed)
Nurtec PA, Key: [id number], [id number]. Your information has been sent to [facility].

## 2022-01-14 NOTE — Telephone Encounter (Signed)
I sent an rx for ativan on 01/04/22 to the [facility] on [address] so she should be able to pick that up for the MRI

## 2022-01-19 NOTE — Telephone Encounter (Signed)
NURTEC TAB '75MG'$  ODT, use as directed (8 per month), is approved through 01/14/2023. Approval faxed to pharmacy.

## 2022-01-20 ENCOUNTER — Other Ambulatory Visit: Payer: 59

## 2022-01-22 LAB — PROTEIN / CREATININE RATIO, URINE
Albumin, U: 19.4
Creatinine, Urine: 470.3

## 2022-01-26 ENCOUNTER — Ambulatory Visit (INDEPENDENT_AMBULATORY_CARE_PROVIDER_SITE_OTHER): Payer: 59

## 2022-01-26 DIAGNOSIS — R519 Headache, unspecified: Secondary | ICD-10-CM | POA: Diagnosis not present

## 2022-01-29 ENCOUNTER — Telehealth: Payer: Self-pay | Admitting: *Deleted

## 2022-01-29 NOTE — Telephone Encounter (Signed)
Labs received from:[facility]  Drawn on:01/22/2022  Reviewed by:Dr. Bo Merino   Labs drawn:Albumin/Creat. Ratio, IFE, PRotein/Creat. Ratio, Renal Function Panel, UA, CBC  Results:UA Amber, 1+ Protein, 1+ Ketones, Creat. 1.13 GFR 59

## 2022-02-02 ENCOUNTER — Encounter (INDEPENDENT_AMBULATORY_CARE_PROVIDER_SITE_OTHER): Payer: 59 | Admitting: Psychiatry

## 2022-02-02 DIAGNOSIS — G43009 Migraine without aura, not intractable, without status migrainosus: Secondary | ICD-10-CM

## 2022-02-03 NOTE — Telephone Encounter (Signed)

## 2022-02-10 ENCOUNTER — Telehealth: Payer: Self-pay | Admitting: Psychiatry

## 2022-02-10 MED ORDER — EMGALITY 120 MG/ML ~~LOC~~ SOAJ: 120.0000 mg | SUBCUTANEOUS | 6 refills | Status: DC

## 2022-02-10 MED ORDER — EMGALITY 120 MG/ML ~~LOC~~ SOAJ: 240.0000 mg | Freq: Once | SUBCUTANEOUS | 0 refills | Status: AC

## 2022-02-10 NOTE — Telephone Encounter (Signed)
Called patient and advised her that Dr Billey Gosling was referring to a CGRP injection, not Botox, for a monthly injection.  I explained the medication and the process of administering it. She would like a prescription sent to [facility]. She understands it will require a PA. Patient verbalized understanding, appreciation.

## 2022-02-10 NOTE — Telephone Encounter (Signed)
Rx for Emgality sent to her pharmacy, thanks

## 2022-02-10 NOTE — Telephone Encounter (Signed)
Pt called wanting to know what the update is on her starting Botox treatment. Please advise.

## 2022-02-11 NOTE — Telephone Encounter (Signed)
Called [facility] PA line, spoke with rep who stated Emgality PA still pending, decision via fax in 48-72 hours.

## 2022-02-11 NOTE — Telephone Encounter (Signed)
Emgality PA (Key: [id number]), faxed office notes to be attached.

## 2022-02-11 NOTE — Telephone Encounter (Signed)
CMM message: We have assigned a new case ID to this request, and it is now pending review. You may contact the Prior Authorization Department at [phone] for further questions.

## 2022-02-11 NOTE — Telephone Encounter (Signed)
Received e mail: documents attached. Your information has been sent to [facility]. 

## 2022-02-16 ENCOUNTER — Encounter: Payer: Self-pay | Admitting: *Deleted

## 2022-02-16 NOTE — Telephone Encounter (Signed)
Called [facility] provider PA line, spoke with Letta Median who stated Emgality 1 ml approved x 6 months but 2 mLs not approved. I advised her the 2 mL is needed for loading dose only, then 1 mL every 30 days. She transferred to pharmacy, spoke with [facility] pharmacist who stated he will get loading dose approved. Approved x 1 month, 2 injector pens, loading dose. PA # [id number].

## 2022-02-16 NOTE — Telephone Encounter (Signed)
Per CMM: We have assigned a new case ID to this request, and it is now pending review. You may contact the Prior Authorization Department at [phone] for further questions.

## 2022-03-29 NOTE — Progress Notes (Signed)
Office Visit Note  Patient: Jordan Mayer             Date of Birth: 1971-04-26           MRN: [id number]             PCP: Burnis Medin, MD Referring: Burnis Medin, MD Visit Date: 04/12/2022 Occupation: '@GUAROCC'$ @  Subjective:  Mild, dry eyes and extreme fatigue  History of Present Illness: Jordan Mayer is a 51 y.o. female with history of low titer positive ANA.  She returns for the follow-up visit.  She states she has been experiencing extreme fatigue.  She believes that she has lupus based on overall symptoms she has been experiencing.  She complains of fatigue, dry mouth, dry eyes, frequent sores in her mouth.  She also notices that her fingers turn white while she is in the shower but she has not not noticed them turning blue.  She notices rash which comes and goes in different parts of her body.  She has noticed enlarged lymph nodes in the neck and posterior cervical region.  She notices swelling in her hands and her feet.  She states she continues to have alternating diarrhea and constipation.  She has seen gastroenterologist here locally and also at [facility].  She may have IBS.  She is also seen an allergist for shortness of breath and her PFTs were normal.  She would like to try hydroxychloroquine.  Activities of Daily Living:  Patient reports morning stiffness for 1.5 hours.   Patient Reports nocturnal pain.  Difficulty dressing/grooming: Denies Difficulty climbing stairs: Reports Difficulty getting out of chair: Reports Difficulty using hands for taps, buttons, cutlery, and/or writing: Reports  Review of Systems  Constitutional:  Positive for fatigue.  HENT:  Positive for mouth sores and mouth dryness.   Eyes:  Positive for dryness.  Respiratory:  Positive for shortness of breath.        PFTs were normal per patient at the allergist.  Cardiovascular:  Negative for chest pain and palpitations.  Gastrointestinal:  Positive for constipation and diarrhea. Negative for  blood in stool.  Endocrine: Positive for increased urination.  Genitourinary:  Negative for difficulty urinating.  Musculoskeletal:  Positive for joint pain, joint pain, joint swelling, muscle weakness and morning stiffness. Negative for gait problem, myalgias, muscle tenderness and myalgias.  Skin:  Positive for color change, rash and hair loss. Negative for sensitivity to sunlight.  Allergic/Immunologic: Negative for susceptible to infections.  Neurological:  Positive for light-headedness and headaches. Negative for dizziness.  Hematological:  Positive for swollen glands.  Psychiatric/Behavioral:  Positive for sleep disturbance. Negative for depressed mood. The patient is nervous/anxious.     PMFS History:  Patient Active Problem List   Diagnosis Date Noted   Positive ANA (antinuclear antibody) 04/12/2022   Genetic testing 07/21/2021   Family history of melanoma 07/13/2021   Varicose veins of leg with complications 67/04/4579   Varicose veins of bilateral lower extremities with other complications 99/83/3825   Vertigo 12/23/2011   Otalgia of both ears 09/17/2010   SLEEP DISORDER/DISTURBANCE 09/27/2008   FATIGUE 07/15/2008   ADHD 12/05/2007   HEADACHE 11/26/2007   ALLERGIC RHINITIS 10/13/2007   NEPHROLITHIASIS, HX OF 10/13/2007   NONSPECIFIC MESENTERIC LYMPHADENITIS 03/17/2007   RENAL CALCULUS, LEFT 03/17/2007   DEPRESSION 03/07/2007   DYSPNEA ON EXERTION 03/07/2007    Past Medical History:  Diagnosis Date   ADD (attention deficit disorder)    Allergy    Anxiety  Asthma    Chicken pox    Chronic headaches    Depression    Family history of melanoma    Gallstones    Kidney stones    PCOS (polycystic ovarian syndrome)    Pneumonia    Renal disorder    kidney disease   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    Shingles     Family History  Problem Relation Age of Onset   Varicose Veins Mother    Asthma Mother    Varicose Veins Sister    Birth defects Paternal Aunt         Cognitively Challenged   Miscarriages / Stillbirths Paternal Aunt    Varicose Veins Paternal Aunt    Varicose Veins Paternal Aunt    Melanoma Paternal Aunt        melanoma x2   Arthritis Maternal Grandmother    Arthritis Paternal Grandfather    Hearing loss Paternal Grandfather    Prostate cancer Cousin    Healthy Son    Healthy Son    Colon cancer Neg Hx    Colon polyps Neg Hx    Esophageal cancer Neg Hx    Rectal cancer Neg Hx    Stomach cancer Neg Hx    Past Surgical History:  Procedure Laterality Date   BUNIONECTOMY Right    CHOLECYSTECTOMY     COLONOSCOPY     HIATAL HERNIA REPAIR     with gastric sleeve   LAPAROSCOPIC CHOLECYSTECTOMY SINGLE SITE WITH INTRAOPERATIVE CHOLANGIOGRAM N/A 06/14/2017   Procedure: LAPAROSCOPIC CHOLECYSTECTOMY SINGLE SITE;  Surgeon: Michael Boston, MD;  Location: [facility];  Service: General;  Laterality: N/A;   [place] RESECTION  09/2013   LASER ABLATION     Vascular   TONSILLECTOMY     WISDOM TOOTH EXTRACTION     Social History   Social History Narrative   7 hours of sleep per night   Lives with her husband and 2 kids   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  Constitutional:      Appearance: She is well-developed.  HENT:     Head: Normocephalic and atraumatic.  Eyes:     Conjunctiva/sclera: Conjunctivae normal.  Cardiovascular:     Rate and Rhythm: Normal rate and regular rhythm.     Heart sounds: Normal heart  sounds.  Pulmonary:     Effort: Pulmonary effort is normal.     Breath sounds: Normal breath sounds.  Abdominal:     General: Bowel sounds are normal.     Palpations: Abdomen is soft.  Musculoskeletal:     Cervical back: Normal range of motion.  Lymphadenopathy:     Cervical: No cervical adenopathy.  Skin:    General: Skin is warm and dry.     Capillary Refill: Capillary refill takes less than 2 seconds.  Neurological:     Mental Status: She is alert and oriented to  person, place, and time.  Psychiatric:        Behavior: Behavior normal.      Musculoskeletal Exam: Cervical, thoracic and lumbar spine were in good range of motion.  Shoulder joints, elbow joints, wrist joints, MCPs PIPs and DIPs with good range of motion with no synovitis.  Hip joints, knee joints, ankles, MTPs and PIPs with good range of motion with no synovitis.  CDAI Exam: CDAI Score: -- Patient Global: --; Provider Global: -- Swollen: --; Tender: -- Joint Exam 04/12/2022   No joint exam has been documented for this visit   There is currently no information documented on the homunculus. Go to the Rheumatology activity and complete the homunculus joint exam.  Investigation: No additional findings.  Imaging: No results found.  Recent Labs: Lab Results  Component Value Date   WBC 5.2 12/10/2021   HGB 14.0 12/10/2021   PLT 295 12/10/2021   NA 138 12/10/2021   K 4.1 12/10/2021   CL 104 12/10/2021   CO2 25 12/10/2021   GLUCOSE 90 12/10/2021   BUN 18 12/10/2021   CREATININE 1.20 (H) 12/10/2021   BILITOT 0.4 12/10/2021   ALKPHOS 60 06/02/2021   AST 13 12/10/2021   ALT 11 12/10/2021   PROT 7.1 12/10/2021   ALBUMIN 4.2 06/02/2021   CALCIUM 9.4 12/10/2021   GFRAA >60 08/15/2019    Labs received from:[facility]   Drawn on:01/22/2022   Reviewed by:Dr. Bo Merino    Labs drawn:Albumin/Creat. Ratio, IFE, PRotein/Creat. Ratio, Renal Function Panel, UA, CBC   Results:UA Amber, 1+ Protein,  1+ Ketones, Creat. 1.13 GFR 59       Speciality Comments: No specialty comments available.  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  All autoimmune work-up was negative in the past except for low titer ANA which was not significant.  Patient states her sicca symptoms and fatigue are getting worse.  Her sister has Sjogren's.  She would like to try hydroxychloroquine.  Indications side effects contraindications of hydroxychloroquine were discussed at length.  Handout was given and side effects were discussed.  Based on her height her dose of hydroxychloroquine will be 200 mg p.o. twice daily Monday to Friday.  Patient wants to proceed with hydroxychloroquine.  I will obtain autoimmune labs again today.  Plan: ANA, Anti-DNA antibody, double-stranded, Sjogrens syndrome-A extractable nuclear antibody, Sjogrens syndrome-B extractable nuclear antibody, C3 and C4  Patient was counseled on the purpose, proper use, and adverse effects of hydroxychloroquine including nausea/diarrhea, skin rash, headaches, and sun sensitivity.  Advised patient to wear sunscreen once starting hydroxychloroquine to reduce risk of rash associated with sun sensitivity.  Discussed importance of annual eye exams while on hydroxychloroquine to monitor to ocular toxicity and discussed importance of frequent laboratory monitoring.  Provided patient with eye exam form for baseline ophthalmologic exam.  Reviewed risk for QTC prolongation when used in combination with other QTc prolonging agents  (including but not limited to antiarrhythmics, macrolide antibiotics, flouroquinolones, tricyclic antidepressants, citalopram, specific antipsychotics, ondansetron, migraine triptans, and methadone). Provided patient with educational materials on hydroxychloroquine and answered all questions.  Patient consented to hydroxychloroquine.  Will upload consent in the media tab.    High risk medication use - Plan: CBC with Differential/Platelet, COMPLETE METABOLIC PANEL WITH GFR  Polyarthralgia - History of pain in multiple joints including shoulders, hands, hips, ankles and lower back.  She complains of pain and swelling in her bilateral hands and bilateral feet.  No synovitis was noted.  Sicca complex (HCC)-she has history of dry mouth and dry eyes and frequent cavities.  She had decreased salivary pool in her mouth.  I also discussed use of pilocarpine in the future.  Over-the-counter products were discussed at the last visit which she has been using.  Elevated serum creatinine-she was evaluated by nephrology for elevated creatinine.  Her repeat creatinine was better.  She was advised to return in a year for follow-up visit.  Vitamin D deficiency -she has been taking vitamin D on a daily basis since the last visit.  I will recheck vitamin D level.  Plan: VITAMIN D 25 Hydroxy (Vit-D Deficiency, Fractures)  Other fatigue-she continues to have extreme fatigue.  S/P gastric sleeve procedure-she has been followed by gastroenterologist here and at [facility].  Patient states that she continues to have alternating diarrhea with constipation.  She may have IBS.  She will follow-up with the gastroenterologist here.  Other medical problems are listed as follows:  Varicose veins of bilateral lower extremities with other complications  History of nephrolithiasis  History of depression  History of ADHD  History of PCOS  Family history of Sjogren's disease-Sister  Orders: Orders Placed This Encounter   Procedures   CBC with Differential/Platelet   COMPLETE METABOLIC PANEL WITH GFR   ANA   Anti-DNA antibody, double-stranded   Sjogrens syndrome-A extractable nuclear antibody   Sjogrens syndrome-B extractable nuclear antibody   C3 and C4   VITAMIN D 25 Hydroxy (Vit-D Deficiency, Fractures)   No orders of the defined types were placed in this encounter.    Follow-Up Instructions: Return in about 6 weeks (around 05/24/2022) for +ANA, sicca.   Bo Merino, MD  Note - This record has been created using Editor, commissioning.  Chart creation errors have been sought, but may not always  have been located. Such creation errors do not reflect on  the standard of medical care.

## 2022-04-12 ENCOUNTER — Encounter: Payer: Self-pay | Admitting: Rheumatology

## 2022-04-12 ENCOUNTER — Ambulatory Visit: Payer: 59 | Attending: Rheumatology | Admitting: Rheumatology

## 2022-04-12 VITALS — BP 120/86 | HR 75 | Resp 15 | Ht 65.75 in | Wt 171.6 lb

## 2022-04-12 DIAGNOSIS — Z87442 Personal history of urinary calculi: Secondary | ICD-10-CM

## 2022-04-12 DIAGNOSIS — M359 Systemic involvement of connective tissue, unspecified: Secondary | ICD-10-CM | POA: Diagnosis not present

## 2022-04-12 DIAGNOSIS — I83893 Varicose veins of bilateral lower extremities with other complications: Secondary | ICD-10-CM

## 2022-04-12 DIAGNOSIS — Z903 Acquired absence of stomach [part of]: Secondary | ICD-10-CM

## 2022-04-12 DIAGNOSIS — Z8659 Personal history of other mental and behavioral disorders: Secondary | ICD-10-CM

## 2022-04-12 DIAGNOSIS — R7989 Other specified abnormal findings of blood chemistry: Secondary | ICD-10-CM

## 2022-04-12 DIAGNOSIS — Z79899 Other long term (current) drug therapy: Secondary | ICD-10-CM | POA: Diagnosis not present

## 2022-04-12 DIAGNOSIS — R768 Other specified abnormal immunological findings in serum: Secondary | ICD-10-CM | POA: Insufficient documentation

## 2022-04-12 DIAGNOSIS — M3500 Sicca syndrome, unspecified: Secondary | ICD-10-CM

## 2022-04-12 DIAGNOSIS — E559 Vitamin D deficiency, unspecified: Secondary | ICD-10-CM

## 2022-04-12 DIAGNOSIS — M2550 Pain in unspecified joint: Secondary | ICD-10-CM | POA: Diagnosis not present

## 2022-04-12 DIAGNOSIS — Z8269 Family history of other diseases of the musculoskeletal system and connective tissue: Secondary | ICD-10-CM

## 2022-04-12 DIAGNOSIS — Z8742 Personal history of other diseases of the female genital tract: Secondary | ICD-10-CM

## 2022-04-12 DIAGNOSIS — R5383 Other fatigue: Secondary | ICD-10-CM

## 2022-04-12 NOTE — Patient Instructions (Signed)
Hydroxychloroquine Tablets What is this medication? HYDROXYCHLOROQUINE (hye drox ee KLOR oh kwin) treats autoimmune conditions, such as rheumatoid arthritis and lupus. It works by slowing down an overactive immune system. It may also be used to prevent and treat malaria. It works by killing the parasite that causes malaria. It belongs to a group of medications called DMARDs. This medicine may be used for other purposes; ask your health care provider or pharmacist if you have questions. COMMON BRAND NAME(S): Plaquenil, Quineprox What should I tell my care team before I take this medication? They need to know if you have any of these conditions: Diabetes Eye disease, vision problems Frequently drink alcohol G6PD deficiency Heart disease Irregular heartbeat or rhythm Kidney disease Liver disease Porphyria Psoriasis An unusual or allergic reaction to hydroxychloroquine, other medications, foods, dyes, or preservatives Pregnant or trying to get pregnant Breastfeeding How should I use this medication? Take this medication by mouth with water. Take it as directed on the prescription label. Do not cut, crush, or chew this medication. Swallow the tablets whole. Take it with food. Do not take it more than directed. Take all of this medication unless your care team tells you to stop it early. Keep taking it even if you think you are better. Take products with antacids in them at a different time of day than this medication. Take this medication 4 hours before or 4 hours after antacids. Talk to your care team if you have questions. Talk to your care team about the use of this medication in children. While this medication may be prescribed for selected conditions, precautions do apply. Overdosage: If you think you have taken too much of this medicine contact a poison control center or emergency room at once. NOTE: This medicine is only for you. Do not share this medicine with others. What if I miss a  dose? If you miss a dose, take it as soon as you can. If it is almost time for your next dose, take only that dose. Do not take double or extra doses. What may interact with this medication? Do not take this medication with any of the following: Cisapride Dronedarone Pimozide Thioridazine This medication may also interact with the following: Ampicillin Antacids Cimetidine Cyclosporine Digoxin Kaolin Medications for diabetes, such as insulin, glipizide, glyburide Medications for seizures, such as carbamazepine, phenobarbital, phenytoin Mefloquine Methotrexate Other medications that cause heart rhythm changes Praziquantel This list may not describe all possible interactions. Give your health care provider a list of all the medicines, herbs, non-prescription drugs, or dietary supplements you use. Also tell them if you smoke, drink alcohol, or use illegal drugs. Some items may interact with your medicine. What should I watch for while using this medication? Visit your care team for regular checks on your progress. Tell your care team if your symptoms do not start to get better or if they get worse. You may need blood work done while you are taking this medication. If you take other medications that can affect heart rhythm, you may need more testing. Talk to your care team if you have questions. Your vision may be tested before and during use of this medication. Tell your care team right away if you have any change in your eyesight. This medication may cause serious skin reactions. They can happen weeks to months after starting the medication. Contact your care team right away if you notice fevers or flu-like symptoms with a rash. The rash may be red or purple and then turn   into blisters or peeling of the skin. Or, you might notice a red rash with swelling of the face, lips or lymph nodes in your neck or under your arms. If you or your family notice any changes in your behavior, such as new or  worsening depression, thoughts of harming yourself, anxiety, or other unusual or disturbing thoughts, or memory loss, call your care team right away. What side effects may I notice from receiving this medication? Side effects that you should report to your care team as soon as possible: Allergic reactions--skin rash, itching, hives, swelling of the face, lips, tongue, or throat Aplastic anemia--unusual weakness or fatigue, dizziness, headache, trouble breathing, increased bleeding or bruising Change in vision Heart rhythm changes--fast or irregular heartbeat, dizziness, feeling faint or lightheaded, chest pain, trouble breathing Infection--fever, chills, cough, or sore throat Low blood sugar (hypoglycemia)--tremors or shaking, anxiety, sweating, cold or clammy skin, confusion, dizziness, rapid heartbeat Muscle injury--unusual weakness or fatigue, muscle pain, dark yellow or brown urine, decrease in amount of urine Pain, tingling, or numbness in the hands or feet Rash, fever, and swollen lymph nodes Redness, blistering, peeling, or loosening of the skin, including inside the mouth Thoughts of suicide or self-harm, worsening mood, or feelings of depression Unusual bruising or bleeding Side effects that usually do not require medical attention (report to your care team if they continue or are bothersome): Diarrhea Headache Nausea Stomach pain Vomiting This list may not describe all possible side effects. Call your doctor for medical advice about side effects. You may report side effects to FDA at 1-800-FDA-1088. Where should I keep my medication? Keep out of the reach of children and pets. Store at room temperature up to 30 degrees C (86 degrees F). Protect from light. Get rid of any unused medication after the expiration date. To get rid of medications that are no longer needed or have expired: Take the medication to a medication take-back program. Check with your pharmacy or law enforcement  to find a location. If you cannot return the medication, check the label or package insert to see if the medication should be thrown out in the garbage or flushed down the toilet. If you are not sure, ask your care team. If it is safe to put it in the trash, empty the medication out of the container. Mix the medication with cat litter, dirt, coffee grounds, or other unwanted substance. Seal the mixture in a bag or container. Put it in the trash. NOTE: This sheet is a summary. It may not cover all possible information. If you have questions about this medicine, talk to your doctor, pharmacist, or health care provider.  2023 Elsevier/Gold Standard (2004-07-14 00:00:00)  Standing Labs We placed an order today for your standing lab work.   Please have your standing labs drawn in  one month, three months and every 5 months  Please have your labs drawn 2 weeks prior to your appointment so that the provider can discuss your lab results at your appointment.  Please note that you may see your imaging and lab results in [place] before we have reviewed them. We will contact you once all results are reviewed. Please allow our office up to 72 hours to thoroughly review all of the results before contacting the office for clarification of your results.  Lab hours are:   Monday through Thursday from 8:00 am -12:30 pm and 1:00 pm-5:00 pm and Friday from 8:00 am-12:00 pm.  Please be advised, all patients with office appointments requiring  lab work will take precedent over walk-in lab work.   Labs are drawn by [facility]. Please bring your co-pay at the time of your lab draw.  You may receive a bill from [facility] for your lab work.  Please note if you are on Hydroxychloroquine and and an order has been placed for a Hydroxychloroquine level, you will need to have it drawn 4 hours or more after your last dose.  If you wish to have your labs drawn at another location, please call the office 24 hours in advance so we  can fax the orders.  The office is located at [address] [place], [place], [place] [address] No appointment is necessary.    If you have any questions regarding directions or hours of operation,  please call [phone].   As a reminder, please drink plenty of water prior to coming for your lab work. Thanks!  Vaccines You are taking a medication(s) that can suppress your immune system.  The following immunizations are recommended: Flu annually Covid-19  Td/Tdap (tetanus, diphtheria, pertussis) every 10 years Pneumonia (Prevnar 15 then Pneumovax 23 at least 1 year apart.  Alternatively, can take Prevnar 20 without needing additional dose) Shingrix: 2 doses from 4 weeks to 6 months apart  Please check with your PCP to make sure you are up to date.

## 2022-04-12 NOTE — Progress Notes (Signed)
Pharmacy Note  Subjective: Patient presents today to [facility] for follow up office visit.   Patient seen by the pharmacist for counseling on hydroxychloroquine  CTD .    Objective: CMP     Component Value Date/Time   NA 138 12/10/2021 1128   NA 139 06/18/2019 0000   K 4.1 12/10/2021 1128   CL 104 12/10/2021 1128   CO2 25 12/10/2021 1128   GLUCOSE 90 12/10/2021 1128   BUN 18 12/10/2021 1128   BUN 13 06/18/2019 0000   CREATININE 1.20 (H) 12/10/2021 1128   CALCIUM 9.4 12/10/2021 1128   CALCIUM 9.2 07/19/2008 0208   PROT 7.1 12/10/2021 1128   ALBUMIN 4.2 06/02/2021 2148   AST 13 12/10/2021 1128   ALT 11 12/10/2021 1128   ALKPHOS 60 06/02/2021 2148   BILITOT 0.4 12/10/2021 1128   GFRNONAA >60 11/12/2021 1236   GFRAA >60 08/15/2019 1934    CBC    Component Value Date/Time   WBC 5.2 12/10/2021 1128   RBC 4.87 12/10/2021 1128   HGB 14.0 12/10/2021 1128   HCT 41.9 12/10/2021 1128   PLT 295 12/10/2021 1128   MCV 86.0 12/10/2021 1128   MCH 28.7 12/10/2021 1128   MCHC 33.4 12/10/2021 1128   RDW 13.3 12/10/2021 1128   LYMPHSABS 1,056 12/10/2021 1128   MONOABS 0.8 11/12/2021 1236   EOSABS 73 12/10/2021 1128   BASOSABS 21 12/10/2021 1128    Assessment/Plan: Patient was counseled on the purpose, proper use, and adverse effects of hydroxychloroquine including nausea/diarrhea, skin rash, headaches, and sun sensitivity.  Advised patient to wear sunscreen once starting hydroxychloroquine to reduce risk of rash associated with sun sensitivity.  Discussed importance of annual eye exams while on hydroxychloroquine to monitor to ocular toxicity and discussed importance of frequent laboratory monitoring.  Provided patient with eye exam form for baseline ophthalmologic exam. Patient just had an eye exam done 2 months ago; she will follow-up with ophthalmologist to determine if necessary baseline eye exam has been completed. Reviewed risk for QTC prolongation when used in  combination with other QTc prolonging agents (including but not limited to antiarrhythmics, macrolide antibiotics, flouroquinolones, tricyclic antidepressants, citalopram, specific antipsychotics, ondansetron, migraine triptans, and methadone). Reviewed concurrent medications and there are no drug interactions with hydroxychloroquine. Provided patient with educational materials on hydroxychloroquine and answered all questions. Patient consented to hydroxychloroquine. Will upload consent in the media tab.    Dose will be Plaquenil 200 mg twice daily Monday through Friday.  Prescription pending lab results.  [facility] PharmD Candidate [phone]

## 2022-04-14 ENCOUNTER — Encounter: Payer: Self-pay | Admitting: Internal Medicine

## 2022-04-14 ENCOUNTER — Ambulatory Visit: Payer: 59 | Admitting: Adult Health

## 2022-04-15 LAB — CBC WITH DIFFERENTIAL/PLATELET
Absolute Monocytes: 555 cells/uL (ref 200–950)
Basophils Absolute: 19 cells/uL (ref 0–200)
Basophils Relative: 0.5 %
Eosinophils Absolute: 52 cells/uL (ref 15–500)
Eosinophils Relative: 1.4 %
HCT: 39.3 % (ref 35.0–45.0)
Hemoglobin: 13.3 g/dL (ref 11.7–15.5)
Lymphs Abs: 981 cells/uL (ref 850–3900)
MCH: 28.1 pg (ref 27.0–33.0)
MCHC: 33.8 g/dL (ref 32.0–36.0)
MCV: 83.1 fL (ref 80.0–100.0)
MPV: 9.2 fL (ref 7.5–12.5)
Monocytes Relative: 15 %
Neutro Abs: 2094 cells/uL (ref 1500–7800)
Neutrophils Relative %: 56.6 %
Platelets: 255 10*3/uL (ref 140–400)
RBC: 4.73 10*6/uL (ref 3.80–5.10)
RDW: 12.8 % (ref 11.0–15.0)
Total Lymphocyte: 26.5 %
WBC: 3.7 10*3/uL — ABNORMAL LOW (ref 3.8–10.8)

## 2022-04-15 LAB — COMPLETE METABOLIC PANEL WITH GFR
AG Ratio: 1.3 (calc) (ref 1.0–2.5)
ALT: 18 U/L (ref 6–29)
AST: 15 U/L (ref 10–35)
Albumin: 3.9 g/dL (ref 3.6–5.1)
Alkaline phosphatase (APISO): 78 U/L (ref 37–153)
BUN/Creatinine Ratio: 12 (calc) (ref 6–22)
BUN: 13 mg/dL (ref 7–25)
CO2: 26 mmol/L (ref 20–32)
Calcium: 9.1 mg/dL (ref 8.6–10.4)
Chloride: 106 mmol/L (ref 98–110)
Creat: 1.05 mg/dL — ABNORMAL HIGH (ref 0.50–1.03)
Globulin: 2.9 g/dL (calc) (ref 1.9–3.7)
Glucose, Bld: 84 mg/dL (ref 65–99)
Potassium: 4.1 mmol/L (ref 3.5–5.3)
Sodium: 139 mmol/L (ref 135–146)
Total Bilirubin: 0.3 mg/dL (ref 0.2–1.2)
Total Protein: 6.8 g/dL (ref 6.1–8.1)
eGFR: 64 mL/min/{1.73_m2} (ref >=60)

## 2022-04-15 LAB — VITAMIN D 25 HYDROXY (VIT D DEFICIENCY, FRACTURES): Vit D, 25-Hydroxy: 54 ng/mL (ref 30–100)

## 2022-04-15 LAB — ANTI-NUCLEAR AB-TITER (ANA TITER): ANA Titer 1: 1:40 {titer} — ABNORMAL HIGH

## 2022-04-15 LAB — SJOGRENS SYNDROME-A EXTRACTABLE NUCLEAR ANTIBODY: SSA (Ro) (ENA) Antibody, IgG: <1 AI

## 2022-04-15 LAB — C3 AND C4
C3 Complement: 177 mg/dL (ref 83–193)
C4 Complement: 35 mg/dL (ref 15–57)

## 2022-04-15 LAB — SJOGRENS SYNDROME-B EXTRACTABLE NUCLEAR ANTIBODY: SSB (La) (ENA) Antibody, IgG: <1 AI

## 2022-04-15 LAB — ANTI-DNA ANTIBODY, DOUBLE-STRANDED: ds DNA Ab: 2 IU/mL

## 2022-04-15 LAB — ANA: Anti Nuclear Antibody (ANA): POSITIVE — AB

## 2022-04-16 ENCOUNTER — Telehealth: Payer: Self-pay | Admitting: *Deleted

## 2022-04-16 DIAGNOSIS — Z79899 Other long term (current) drug therapy: Secondary | ICD-10-CM

## 2022-04-16 NOTE — Telephone Encounter (Signed)
-----   Message from Ofilia Neas, PA-C sent at 04/16/2022 12:00 PM EST ----- WBC count is borderline low-3.7. rest of CBC WNL.  Creatinine is borderline elevated but has improved-1.05 and GFR is WNL-64. Rest of CMP WNL.  Recommend repeating CBC and CMP in 1 month after initiating plaquenil.   ANA remains positive-low titer. dsDNA is negative. Ro and La antibodies negative. Complements WNL.  Vitamin D WNL.

## 2022-04-20 ENCOUNTER — Other Ambulatory Visit: Payer: Self-pay

## 2022-04-20 MED ORDER — HYDROXYCHLOROQUINE SULFATE 200 MG PO TABS: ORAL_TABLET | ORAL | 2 refills | Status: DC

## 2022-04-20 NOTE — Telephone Encounter (Signed)
Advised patient that prescription was sent to the requested pharmacy. Patient verbalized understanding.

## 2022-04-20 NOTE — Telephone Encounter (Signed)
Patient called and left message about new rx based on recent lab results.   Labs: 04/12/2022 WBC count is borderline low-3.7. rest of CBC WNL. Creatinine is borderline elevated but has improved-1.05 and GFR is WNL-64. Rest of CMP WNL. Recommend repeating CBC and CMP in 1 month after initiating plaquenil.   ANA remains positive-low titer. dsDNA is negative. Ro and La antibodies negative. Complements WNL. Vitamin D WNL.  Dose of plaquenil per office note on 04/12/2022: Dose will be Plaquenil 200 mg twice daily Monday through Friday.   Patient requested that prescription is sent to [facility] on [address].

## 2022-05-19 ENCOUNTER — Other Ambulatory Visit: Payer: Self-pay

## 2022-05-19 MED ORDER — ONDANSETRON 8 MG PO TBDP: 8.0000 mg | ORAL_TABLET | Freq: Three times a day (TID) | ORAL | 0 refills | Status: DC | PRN

## 2022-05-19 NOTE — Progress Notes (Signed)
Office Visit Note  Patient: Jordan Mayer             Date of Birth: 1970/07/12           MRN: [id number]             PCP: Madelin Headings, MD Referring: Madelin Headings, MD Visit Date: 05/24/2022 Occupation: @GUAROCC @  Subjective:  Medication monitoring  History of Present Illness: Jordan Mayer is a 52 y.o. female with history of undifferentiated connective tissue disease.  She is prescribed plaquenil 200 mg 1 tablet by mouth twice daily Monday through Friday.  Plaquenil was added after her last office visit on 04/12/2022.  Patient reports that she has been taking Plaquenil 200 mg 2 tablets in the morning and 2 tablets in the evening Monday through Friday.  She has been experiencing nausea, appetite changes, and diarrhea.  She has not noticed any improvement in her symptoms since initiating Plaquenil.  She continues to experience significant fatigue as well as difficulty sleeping at night especially during flares.  She continues to have myalgias and muscle tenderness in the trapezius muscles bilaterally.  She has ongoing sicca symptoms and intermittent oral ulcers.    Activities of Daily Living:  Patient reports morning stiffness for 1.5 hours.   Patient Reports nocturnal pain.  Difficulty dressing/grooming: Reports Difficulty climbing stairs: Reports Difficulty getting out of chair: Denies Difficulty using hands for taps, buttons, cutlery, and/or writing: Denies  Review of Systems  Constitutional:  Positive for fatigue.  HENT:  Positive for mouth sores and mouth dryness. Negative for nose dryness.   Eyes:  Positive for pain and dryness. Negative for visual disturbance.  Respiratory:  Negative for cough, hemoptysis, shortness of breath and difficulty breathing.   Cardiovascular:  Negative for chest pain, palpitations, hypertension and swelling in legs/feet.  Gastrointestinal:  Positive for constipation and diarrhea. Negative for blood in stool.  Endocrine: Negative for  increased urination.  Genitourinary:  Positive for involuntary urination. Negative for painful urination.  Musculoskeletal:  Positive for joint pain, gait problem, joint pain, joint swelling, myalgias, muscle weakness, morning stiffness, muscle tenderness and myalgias.  Skin:  Positive for color change, rash, hair loss and sensitivity to sunlight. Negative for pallor, nodules/bumps, skin tightness and ulcers.  Allergic/Immunologic: Negative for susceptible to infections.  Neurological:  Positive for dizziness and headaches. Negative for numbness and weakness.  Hematological:  Positive for swollen glands.  Psychiatric/Behavioral:  Positive for depressed mood and sleep disturbance. The patient is nervous/anxious.     PMFS History:  Patient Active Problem List   Diagnosis Date Noted   Positive ANA (antinuclear antibody) 04/12/2022   Genetic testing 07/21/2021   Family history of melanoma 07/13/2021   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   Vertigo 12/23/2011   Otalgia of both ears 09/17/2010   SLEEP DISORDER/DISTURBANCE 09/27/2008   FATIGUE 07/15/2008   ADHD 12/05/2007   HEADACHE 11/26/2007   ALLERGIC RHINITIS 10/13/2007   NEPHROLITHIASIS, HX OF 10/13/2007   NONSPECIFIC MESENTERIC LYMPHADENITIS 03/17/2007   RENAL CALCULUS, LEFT 03/17/2007   DEPRESSION 03/07/2007   DYSPNEA ON EXERTION 03/07/2007    Past Medical History:  Diagnosis Date   ADD (attention deficit disorder)    Allergy    Anxiety    Asthma    Chicken pox    Chronic headaches    Depression    Family history of melanoma    Gallstones  Kidney stones    PCOS (polycystic ovarian syndrome)    Pneumonia    Renal disorder    kidney disease   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    Shingles     Family History  Problem Relation Age of Onset   Varicose Veins Mother    Asthma Mother    Varicose Veins Sister    Birth defects Paternal  Aunt        Cognitively Challenged   Miscarriages / Stillbirths Paternal Aunt    Varicose Veins Paternal Aunt    Varicose Veins Paternal Aunt    Melanoma Paternal Aunt        melanoma x2   Arthritis Maternal Grandmother    Arthritis Paternal Grandfather    Hearing loss Paternal Grandfather    Prostate cancer Cousin    Healthy Son    Healthy Son    Colon cancer Neg Hx    Colon polyps Neg Hx    Esophageal cancer Neg Hx    Rectal cancer Neg Hx    Stomach cancer Neg Hx    Past Surgical History:  Procedure Laterality Date   BUNIONECTOMY Right    CHOLECYSTECTOMY     COLONOSCOPY     HIATAL HERNIA REPAIR     with gastric sleeve   LAPAROSCOPIC CHOLECYSTECTOMY SINGLE SITE WITH INTRAOPERATIVE CHOLANGIOGRAM N/A 06/14/2017   Procedure: LAPAROSCOPIC CHOLECYSTECTOMY SINGLE SITE;  Surgeon: Karie Soda, MD;  Location: [facility];  Service: General;  Laterality: N/A;   LAPAROSCOPIC GASTRIC SLEEVE RESECTION  09/2013   LASER ABLATION     Vascular   TONSILLECTOMY     WISDOM TOOTH EXTRACTION     Social History   Social History Narrative   7 hours of sleep per night   Lives with her husband and 2 kids   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  Constitutional:      Appearance: She is well-developed.  HENT:     Head: Normocephalic and atraumatic.  Eyes:     Conjunctiva/sclera: Conjunctivae normal.  Cardiovascular:     Rate and Rhythm: Normal rate and regular rhythm.     Heart sounds: Normal  heart sounds.  Pulmonary:     Effort: Pulmonary effort is normal.     Breath sounds: Normal breath sounds.  Abdominal:     General: Bowel sounds are normal.     Palpations: Abdomen is soft.  Musculoskeletal:     Cervical back: Normal range of motion.  Skin:    General: Skin is warm and dry.     Capillary Refill: Capillary refill takes less than 2 seconds.  Neurological:     Mental Status: She is alert and oriented to person, place, and time.  Psychiatric:        Behavior: Behavior normal.      Musculoskeletal Exam: C-spine, thoracic spine, lumbar spine have good range of motion.  Trapezius muscle tension and tenderness bilaterally.  Shoulder joints, elbow joints, wrist joints, MCPs, PIPs, DIPs have good range of motion with no synovitis.  Complete fist formation bilaterally.  Hip joints have good range of motion with no groin pain.  Knee joints have good range of motion with no warmth or effusion.  Ankle joints have good range of motion with no tenderness or joint swelling.  CDAI Exam: CDAI Score: -- Patient Global: --; Provider Global: -- Swollen: --; Tender: -- Joint Exam 05/24/2022   No joint exam has been documented for this visit   There is currently no information documented on the homunculus. Go to the Rheumatology activity and complete the homunculus joint exam.  Investigation: No additional findings.  Imaging: No results found.  Recent Labs: Lab Results  Component Value Date   WBC 4.8 05/20/2022   HGB 13.7 05/20/2022   PLT 240 05/20/2022   NA 139 05/20/2022   K 4.4 05/20/2022   CL 105 05/20/2022   CO2 26 05/20/2022   GLUCOSE 95 05/20/2022   BUN 15 05/20/2022   CREATININE 1.10 (H) 05/20/2022   BILITOT 0.3 05/20/2022   ALKPHOS 60 06/02/2021   AST 12 05/20/2022   ALT 13 05/20/2022   PROT 6.8 05/20/2022   ALBUMIN 4.2 06/02/2021   CALCIUM 9.2 05/20/2022   GFRAA >60 08/15/2019    Speciality Comments: No specialty comments available.  Procedures:  No  procedures performed Allergies: Contrast media [iodinated contrast media], Iohexol, Penicillins, and Gadolinium derivatives   Assessment / Plan:     Visit Diagnoses: Undifferentiated connective tissue disease (HCC) - December 10, 2021 urine protein creatinine ratio normal, ANA 1: 40NH, ENA negative, beta-2 GP 1 negative, anticardiolipin negative, C3-C4 normal, RF negative:  Patient was started on plaquenil 200 mg 1 tablet by mouth twice daily Monday through Friday after her last office visit on 04/12/22.  She states she has been taking plaquenil 200 mg 2 tablets twice daily M-F (total 800 mg daily 5 days a week).  She has been experiencing nausea, appetite loss, and diarrhea since initiating Plaquenil.  Patient was advised to reduce the dose of Plaquenil to 200 mg 1 tablet twice daily Monday through Friday only.  She will notify us if she continues to have GI side effects at which time we can try reducing to once daily if needed.  She was in agreement.  She has not yet noticed any clinical benefit since initiating Plaquenil.  She continues to have significant fatigue on a daily basis as well as ongoing sicca symptoms, recurrent oral ulcers, and recurrent rashes.  She is willing to give Plaquenil more time at the prescribed dose.  She will follow-up in the office in 2 months or sooner if needed.  High risk medication use - Plaquenil 200 mg 1 tablet by mouth twice daily Monday through Friday only.   CBC and CMP updated on 05/20/22.  Her next lab work will be due in April and then every 5 months to monitor for drug toxicity. No plaquenil eye examination on file.  Patient was given a Plaquenil eye examination to take with her to her upcoming appointment which is already scheduled.  Polyarthralgia: No synovitis was noted on examination today.  She continues to experience ongoing pain involving multiple joints.  She has not yet noticed any clinical improvement since starting plaquenil.    Sicca complex (HCC):  Unchanged. Recurrent oral ulcers.    Elevated serum creatinine - She was evaluated by nephrology for elevated creatinine. Avoiding NSAID use.   Vitamin D  deficiency: She is taking vitamin D 2000 units daily.   Other fatigue: She continues to experience significant fatigue on a daily basis.  Discussed the importance of regular exercise and good sleep hygiene.   Other medical conditions are listed as follows:   S/P gastric sleeve procedure - she has been followed by gastroenterologist here and at [facility]  Varicose veins of bilateral lower extremities with other complications  History of depression  History of nephrolithiasis  History of ADHD  History of PCOS  Family history of Sjogren's disease-Sister  Orders: No orders of the defined types were placed in this encounter.  No orders of the defined types were placed in this encounter.     Follow-Up Instructions: Return in about 2 months (around 07/23/2022) for UCTD.   Gearldine Bienenstock, PA-C  Note - This record has been created using Dragon software.  Chart creation errors have been sought, but may not always  have been located. Such creation errors do not reflect on  the standard of medical care.

## 2022-05-20 ENCOUNTER — Other Ambulatory Visit: Payer: Self-pay

## 2022-05-20 DIAGNOSIS — Z79899 Other long term (current) drug therapy: Secondary | ICD-10-CM

## 2022-05-21 LAB — COMPLETE METABOLIC PANEL WITH GFR
AG Ratio: 1.4 (calc) (ref 1.0–2.5)
ALT: 13 U/L (ref 6–29)
AST: 12 U/L (ref 10–35)
Albumin: 4 g/dL (ref 3.6–5.1)
Alkaline phosphatase (APISO): 68 U/L (ref 37–153)
BUN/Creatinine Ratio: 14 (calc) (ref 6–22)
BUN: 15 mg/dL (ref 7–25)
CO2: 26 mmol/L (ref 20–32)
Calcium: 9.2 mg/dL (ref 8.6–10.4)
Chloride: 105 mmol/L (ref 98–110)
Creat: 1.1 mg/dL — ABNORMAL HIGH (ref 0.50–1.03)
Globulin: 2.8 g/dL (calc) (ref 1.9–3.7)
Glucose, Bld: 95 mg/dL (ref 65–99)
Potassium: 4.4 mmol/L (ref 3.5–5.3)
Sodium: 139 mmol/L (ref 135–146)
Total Bilirubin: 0.3 mg/dL (ref 0.2–1.2)
Total Protein: 6.8 g/dL (ref 6.1–8.1)
eGFR: 61 mL/min/{1.73_m2} (ref >=60)

## 2022-05-21 LAB — CBC WITH DIFFERENTIAL/PLATELET
Absolute Monocytes: 782 cells/uL (ref 200–950)
Basophils Absolute: 19 cells/uL (ref 0–200)
Basophils Relative: 0.4 %
Eosinophils Absolute: 110 cells/uL (ref 15–500)
Eosinophils Relative: 2.3 %
HCT: 40.9 % (ref 35.0–45.0)
Hemoglobin: 13.7 g/dL (ref 11.7–15.5)
Lymphs Abs: 994 cells/uL (ref 850–3900)
MCH: 28.2 pg (ref 27.0–33.0)
MCHC: 33.5 g/dL (ref 32.0–36.0)
MCV: 84.2 fL (ref 80.0–100.0)
MPV: 9 fL (ref 7.5–12.5)
Monocytes Relative: 16.3 %
Neutro Abs: 2894 cells/uL (ref 1500–7800)
Neutrophils Relative %: 60.3 %
Platelets: 240 10*3/uL (ref 140–400)
RBC: 4.86 10*6/uL (ref 3.80–5.10)
RDW: 13 % (ref 11.0–15.0)
Total Lymphocyte: 20.7 %
WBC: 4.8 10*3/uL (ref 3.8–10.8)

## 2022-05-21 NOTE — Progress Notes (Signed)
CBC Wnl.  Creatinine is borderline elevated-1.10-please clarify if she has been taking any NSAIDs?  Rest of CMP WNL.

## 2022-05-24 ENCOUNTER — Encounter: Payer: Self-pay | Admitting: Physician Assistant

## 2022-05-24 ENCOUNTER — Ambulatory Visit: Payer: 59 | Attending: Physician Assistant | Admitting: Physician Assistant

## 2022-05-24 VITALS — BP 112/80 | HR 163 | Resp 15 | Ht 65.0 in | Wt 173.0 lb

## 2022-05-24 DIAGNOSIS — Z903 Acquired absence of stomach [part of]: Secondary | ICD-10-CM

## 2022-05-24 DIAGNOSIS — R5383 Other fatigue: Secondary | ICD-10-CM

## 2022-05-24 DIAGNOSIS — M3500 Sicca syndrome, unspecified: Secondary | ICD-10-CM | POA: Diagnosis not present

## 2022-05-24 DIAGNOSIS — M2550 Pain in unspecified joint: Secondary | ICD-10-CM

## 2022-05-24 DIAGNOSIS — I83893 Varicose veins of bilateral lower extremities with other complications: Secondary | ICD-10-CM

## 2022-05-24 DIAGNOSIS — Z79899 Other long term (current) drug therapy: Secondary | ICD-10-CM

## 2022-05-24 DIAGNOSIS — E559 Vitamin D deficiency, unspecified: Secondary | ICD-10-CM

## 2022-05-24 DIAGNOSIS — R7989 Other specified abnormal findings of blood chemistry: Secondary | ICD-10-CM

## 2022-05-24 DIAGNOSIS — M359 Systemic involvement of connective tissue, unspecified: Secondary | ICD-10-CM

## 2022-05-24 DIAGNOSIS — Z8742 Personal history of other diseases of the female genital tract: Secondary | ICD-10-CM

## 2022-05-24 DIAGNOSIS — Z8659 Personal history of other mental and behavioral disorders: Secondary | ICD-10-CM

## 2022-05-24 DIAGNOSIS — Z8269 Family history of other diseases of the musculoskeletal system and connective tissue: Secondary | ICD-10-CM

## 2022-05-24 DIAGNOSIS — Z87442 Personal history of urinary calculi: Secondary | ICD-10-CM

## 2022-05-24 NOTE — Patient Instructions (Signed)
Hip Bursitis Rehab Ask your health care provider which exercises are safe for you. Do exercises exactly as told by your health care provider and adjust them as directed. It is normal to feel mild stretching, pulling, tightness, or discomfort as you do these exercises. Stop right away if you feel sudden pain or your pain gets worse. Do not begin these exercises until told by your health care provider. Stretching exercise This exercise warms up your muscles and joints and improves the movement and flexibility of your hip. This exercise also helps to relieve pain and stiffness. Iliotibial band stretch An iliotibial band is a strong band of muscle tissue that runs from the outer side of your hip to the outer side of your thigh and knee. Lie on your side with your left / right leg in the top position. Bend your left / right knee and grab your ankle. Stretch out your bottom arm to help you balance. Slowly bring your knee back so your thigh is slightly behind your body. Slowly lower your knee toward the floor until you feel a gentle stretch on the outside of your left / right thigh. If you do not feel a stretch and your knee will not lower more toward the floor, place the heel of your other foot on top of your knee and pull your knee down toward the floor with your foot. Hold this position for __________ seconds. Slowly return to the starting position. Repeat __________ times. Complete this exercise __________ times a day. Strengthening exercises These exercises build strength and endurance in your hip and pelvis. Endurance is the ability to use your muscles for a long time, even after they get tired. Bridge This exercise strengthens the muscles that move your thigh backward (hip extensors). Lie on your back on a firm surface with your knees bent and your feet flat on the floor. Tighten your buttocks muscles and lift your buttocks off the floor until your trunk is level with your thighs. Do not arch your  back. You should feel the muscles working in your buttocks and the back of your thighs. If you do not feel these muscles, slide your feet 1-2 inches (2.5-5 cm) farther away from your buttocks. If this exercise is too easy, try doing it with your arms crossed over your chest. Hold this position for __________ seconds. Slowly lower your hips to the starting position. Let your muscles relax completely after each repetition. Repeat __________ times. Complete this exercise __________ times a day. Squats This exercise strengthens the muscles in front of your thigh and knee (quadriceps). Stand in front of a table, with your feet and knees pointing straight ahead. You may rest your hands on the table for balance but not for support. Slowly bend your knees and lower your hips like you are going to sit in a chair. Keep your weight over your heels, not over your toes. Keep your lower legs upright so they are parallel with the table legs. Do not let your hips go lower than your knees. Do not bend lower than told by your health care provider. If your hip pain increases, do not bend as low. Hold the squat position for __________ seconds. Slowly push with your legs to return to standing. Do not use your hands to pull yourself to standing. Repeat __________ times. Complete this exercise __________ times a day. Hip hike  Stand sideways on a bottom step. Stand on your left / right leg with your other foot unsupported next to   the step. You can hold on to the railing or wall for balance if needed. Keep your knees straight and your torso square. Then lift your left / right hip up toward the ceiling. Hold this position for __________ seconds. Slowly let your left / right hip lower toward the floor, past the starting position. Your foot should get closer to the floor. Do not lean or bend your knees. Repeat __________ times. Complete this exercise __________ times a day. Single leg stand This exercise increases  your balance. Without shoes, stand near a railing or in a doorway. You may hold on to the railing or door frame as needed for balance. Squeeze your left / right buttock muscles, then lift up your other foot. Do not let your left / right hip push out to the side. It is helpful to stand in front of a mirror for this exercise so you can watch your hip. Hold this position for __________ seconds. Repeat __________ times. Complete this exercise __________ times a day. This information is not intended to replace advice given to you by your health care provider. Make sure you discuss any questions you have with your health care provider. Document Revised: 04/15/2021 Document Reviewed: 04/15/2021 Elsevier Patient Education  2023 [employer]  

## 2022-06-02 ENCOUNTER — Ambulatory Visit: Payer: No Typology Code available for payment source | Admitting: Rheumatology

## 2022-06-21 NOTE — Progress Notes (Unsigned)
Primary neurologist: Dr. Billey Gosling   CC:  headaches  History provided from self  Follow-up visit:  Prior visit: 01/04/2022 with Dr. Billey Gosling  Chief Complaint  Patient presents with   Follow-up    Rm 3 alone Pt is well, migraines have improved since last visit. Does mention dizziness as well      Brief HPI:   Jordan Mayer is a 52 y.o. female who is being seen for follow-up regarding worsening migraine headaches reporting migraines every day at initial visit on 01/04/2022.   At prior visit, she was started on propranolol for prevention and Nurtec for rescue although after about 1 month, no benefit on propranolol therefore discontinued and started on Emgality monthly injection.  Completed MRI brain which was unremarkable. C/o blurred vision, was referred to ophthalmology.   Interval history:  Reports improvement of migraine headaches since initiating Emgality currently experiencing 2 migraine days per month, can worsen with storms.  Will use Nurtec without any great benefit but the next day will have more fatigue type symptoms. Migraines can last at least 24 hours.   She does complain of vertigo type symptoms over the past several months. She does question if underlying rheumatologic diseases are contributing and use of Plaquenil as this was started a few months ago, she plans on further discussing with Dr. Estanislado Pandy at follow-up visit next week. Reports vertigo/dizziness sensation worse when walking (will feel like she is swaying from side-to-side), difficulty closing eyes when standing, mild vertigo with quick head movements. No improvement with laying down, still feels like everything is moving. Denies lightheadedness/syncopal sensation.  Does have chronic neck pain, was told pinched nerve at C5-6, was having issues with right arm radiculopathy but has not experienced the symptoms in several years.    Headache History: Onset: several years Triggers: changes in weather Aura:  blurry Location: occipital, temples Quality/Description: squeezing, stabbing, throbbing, pressure Associated Symptoms:  Photophobia: yes  Phonophobia: yes  Nausea: yes, takes Zofran as needed Worse with activity?: yes Duration of headaches: 24 hours  Headache days per month: 2 Headache free days per month: 28  Current Treatment: Abortive Tylenol  Preventative Emgality  Prior Therapies                                 Tylenol Imitrex 50-100 mg PRN - side effects Maxalt 10 mg PRN - side effects Nurtec  Effexor Topamax - lack of efficacy, kidney stones Neck PT  LABS: CBC    Component Value Date/Time   WBC 4.8 05/20/2022 1349   RBC 4.86 05/20/2022 1349   HGB 13.7 05/20/2022 1349   HCT 40.9 05/20/2022 1349   PLT 240 05/20/2022 1349   MCV 84.2 05/20/2022 1349   MCH 28.2 05/20/2022 1349   MCHC 33.5 05/20/2022 1349   RDW 13.0 05/20/2022 1349   LYMPHSABS 994 05/20/2022 1349   MONOABS 0.8 11/12/2021 1236   EOSABS 110 05/20/2022 1349   BASOSABS 19 05/20/2022 1349      Latest Ref Rng & Units 05/20/2022    1:49 PM 04/12/2022    9:51 AM 12/10/2021   11:28 AM  CMP  Glucose 65 - 99 mg/dL 95  84  90   BUN 7 - 25 mg/dL '15  13  18   '$ Creatinine 0.50 - 1.03 mg/dL 1.10  1.05  1.20   Sodium 135 - 146 mmol/L 139  139  138   Potassium 3.5 -  5.3 mmol/L 4.4  4.1  4.1   Chloride 98 - 110 mmol/L 105  106  104   CO2 20 - 32 mmol/L '26  26  25   '$ Calcium 8.6 - 10.4 mg/dL 9.2  9.1  9.4   Total Protein 6.1 - 8.1 g/dL 6.8  6.8  7.1   Total Bilirubin 0.2 - 1.2 mg/dL 0.3  0.3  0.4   AST 10 - 35 U/L '12  15  13   '$ ALT 6 - 29 U/L '13  18  11      '$ IMAGING:  [facility] 11/12/21: partially empty sella, otherwise unremarkable  Imaging independently reviewed on June 22, 2022   Current Outpatient Medications on File Prior to Visit  Medication Sig Dispense Refill   albuterol (VENTOLIN HFA) 108 (90 Base) MCG/ACT inhaler Inhale into the lungs as needed.     Cholecalciferol (D3 PO) Take by mouth. 2000  mg daily     EVEKEO 10 MG TABS Take by mouth. Take 2 tablets in the morning and 1 in the afternoon     fluticasone (FLONASE) 50 MCG/ACT nasal spray Place 1 spray into both nostrils daily.     Galcanezumab-gnlm (EMGALITY) 120 MG/ML SOAJ Inject 120 mg into the skin every 30 (thirty) days. 1.12 mL 6   [name] 24 FE 1-20 MG-MCG(24) tablet Take 1 tablet by mouth daily.     hydroxychloroquine (PLAQUENIL) 200 MG tablet Take '200mg'$  by mouth twice daily, Monday through Friday only. None on Saturday or Sunday. 40 tablet 2   Multiple Vitamin (MULTIVITAMIN) tablet Take 1 tablet by mouth daily.     ondansetron (ZOFRAN-ODT) 8 MG disintegrating tablet Take 1 tablet (8 mg total) by mouth every 8 (eight) hours as needed for nausea or vomiting. 20 tablet 0   Rimegepant Sulfate (NURTEC) 75 MG TBDP Take 75 mg by mouth as needed. 8 tablet 6   spironolactone (ALDACTONE) 100 MG tablet Take 100 mg by mouth 2 (two) times daily.     traZODone (DESYREL) 100 MG tablet Take 100 mg by mouth at bedtime.     venlafaxine (EFFEXOR) 37.5 MG tablet Take 37.5 mg by mouth 2 (two) times daily.     No current facility-administered medications on file prior to visit.     Allergies: Allergies  Allergen Reactions   Contrast Media [Iodinated Contrast Media]    Iohexol      Code: HIVES, Desc: 1 hive developed on hand post injection of 125cc's Omni 300., Onset Date: [id number]    Penicillins Other (See Comments)    Has patient had a PCN reaction causing immediate rash, facial/tongue/throat swelling, SOB or lightheadedness with hypotension: Yes Has patient had a PCN reaction causing severe rash involving mucus membranes or skin necrosis: No Has patient had a PCN reaction that required hospitalization: No Has patient had a PCN reaction occurring within the last 10 years: No If all of the above answers are "NO", then may proceed with Cephalosporin use.   Gadolinium Derivatives Hives, Itching and Rash    Family History: Family History   Problem Relation Age of Onset   Varicose Veins Mother    Asthma Mother    Myasthenia gravis Mother        Ocular   Varicose Veins Sister    Arthritis Maternal Grandmother    Arthritis Paternal Grandfather    Hearing loss Paternal Grandfather    Healthy Son    Healthy Son    Birth defects Paternal 41  Cognitively Challenged   Miscarriages / Stillbirths Paternal Aunt    Varicose Veins Paternal Aunt    Varicose Veins Paternal Aunt    Melanoma Paternal Aunt        melanoma x2   Prostate cancer Cousin    Colon cancer Neg Hx    Colon polyps Neg Hx    Esophageal cancer Neg Hx    Rectal cancer Neg Hx    Stomach cancer Neg Hx     Past Medical History: 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    Shingles     Past Surgical History Past Surgical History:  Procedure Laterality Date   BUNIONECTOMY Right    CHOLECYSTECTOMY     COLONOSCOPY     HIATAL HERNIA REPAIR     with gastric sleeve   LAPAROSCOPIC CHOLECYSTECTOMY SINGLE SITE WITH INTRAOPERATIVE CHOLANGIOGRAM N/A 06/14/2017   Procedure: LAPAROSCOPIC CHOLECYSTECTOMY SINGLE SITE;  Surgeon: Michael Boston, MD;  Location: [facility];  Service: General;  Laterality: N/A;   LAPAROSCOPIC GASTRIC SLEEVE RESECTION  09/2013   LASER ABLATION     Vascular   NASAL SINUS SURGERY  12/2021   TONSILLECTOMY     WISDOM TOOTH EXTRACTION      Social History: Social History   Tobacco Use   Smoking status: Never    Passive exposure: Never   Smokeless tobacco: Never  Vaping Use   Vaping Use: Never used  Substance Use Topics   Alcohol use: Yes    Comment: occasional, 1-2 monthly   Drug use: No    ROS: Negative for fevers, chills.  Positive for headaches, blurred vision. All other systems reviewed and negative unless stated otherwise in HPI.   Physical Exam:   Vital Signs: BP 114/80   Pulse 78   Ht '5\' 5"'$  (1.651 m)   Wt 174 lb (78.9 kg)   BMI 28.96 kg/m  GENERAL: well appearing,in no acute distress,alert SKIN:  Color, texture, turgor normal. No rashes or lesions HEAD:  Normocephalic/atraumatic. CV:  RRR RESP: Normal respiratory effort MSK: +tenderness to palpation over occiput, neck, and shoulders  NEUROLOGICAL: Mental Status: Alert, oriented to person, place and time,Follows commands Cranial Nerves: PERRL, optic discs sharp, visual fields intact to confrontation, extraocular movements intact, facial sensation intact, no facial droop or ptosis, hearing grossly intact, no dysarthria Motor: muscle strength 5/5 both upper and lower extremities,no drift, normal tone Reflexes: 2+ throughout Sensation: intact to light touch all 4 extremities Coordination: Finger-to- nose-finger intact bilaterally Gait: normal-based, able to heel toe and tandem walk without great difficulty, romberg with some swaying but does not fall or lose footing   IMPRESSION: 52 year old female with a history of nephrolithiasis, depression, PCOS, migraines who presents for follow up re: worsening headaches. Was seen by Dr. Billey Gosling on 01/04/2022. MR brain unremarkable.  She was referred to ophthalmology for blurred vision concerns.  Felt headaches consistent with migraines with additional component of medication overuse headache.  Was started on propranolol for prevention and Nurtec for rescue.  No significant improvement on propranolol after about 1 month therefore started on  Emgality monthly injection.  Emgality significantly improved migraine headaches currently about 2/month, no benefit with Nurtec. Also c/o vertigo type symptoms over the past several months, neuro exam intact.    PLAN: -Prevention: Continue Emgality monthly injection -Rescue:  Start Ubrelvy PRN, samples provided, will call if beneficial  -Counseled on limiting OTCs to avoid medication overuse headache -next steps: consider CGRP, Botox, qulipta for prevention  -unclear etiology for vertigo symptoms, possibly multifactorial, recent MRI unremarkable, do not believe repeat MRI needed at this point as recent MRI normal and neuro exam intact. Plans on further discussing symptoms with rheumatology at follow-up visit.  Discussed participating in vestibular rehab but wishes to further speak with rheumatology first. Will discuss further with Dr. Billey Gosling to see if any additional testing from our standpoint needs to be completed   Follow up in 6 months or call earlier if needed   I spent 31 minutes of face-to-face and non-face-to-face time with patient.  This included previsit chart review, lab review, study review, order entry, electronic health record documentation, patient education and discussion regarding the above and answered all the questions to patient's satisfaction  Frann Rider, [facility]  [facility] [address] [place], [place] [address]  Phone [phone] Fax [phone] Note: This document was prepared with digital dictation and possible smart phrase technology. Any transcriptional errors that result from this process are unintentional.

## 2022-06-22 ENCOUNTER — Encounter: Payer: Self-pay | Admitting: Adult Health

## 2022-06-22 ENCOUNTER — Ambulatory Visit (INDEPENDENT_AMBULATORY_CARE_PROVIDER_SITE_OTHER): Payer: 59 | Admitting: Adult Health

## 2022-06-22 VITALS — BP 114/80 | HR 78 | Ht 65.0 in | Wt 174.0 lb

## 2022-06-22 DIAGNOSIS — G43009 Migraine without aura, not intractable, without status migrainosus: Secondary | ICD-10-CM

## 2022-06-22 DIAGNOSIS — R42 Dizziness and giddiness: Secondary | ICD-10-CM | POA: Diagnosis not present

## 2022-06-22 MED ORDER — UBRELVY 100 MG PO TABS: 100.0000 mg | ORAL_TABLET | ORAL | 0 refills | Status: DC | PRN

## 2022-06-22 NOTE — Patient Instructions (Addendum)
Recommend trying Roselyn Meier to take at onset of migraine headaches - please let me know if beneficial and I will send in a prescription   Continue Emgality monthly injection for migraine prevention   Please let me know if you would like to proceed with vestibular therapy for vertigo and imbalance     Follow up in 6 months or call earlier if needed

## 2022-06-24 ENCOUNTER — Ambulatory Visit: Payer: No Typology Code available for payment source | Admitting: Rheumatology

## 2022-06-25 ENCOUNTER — Encounter: Payer: Self-pay | Admitting: Adult Health

## 2022-06-28 ENCOUNTER — Other Ambulatory Visit: Payer: Self-pay | Admitting: Neurology

## 2022-06-28 MED ORDER — UBRELVY 100 MG PO TABS: 100.0000 mg | ORAL_TABLET | ORAL | 5 refills | Status: DC | PRN

## 2022-06-29 ENCOUNTER — Other Ambulatory Visit: Payer: Self-pay | Admitting: Neurology

## 2022-06-29 ENCOUNTER — Other Ambulatory Visit: Payer: Self-pay | Admitting: *Deleted

## 2022-06-29 ENCOUNTER — Telehealth: Payer: Self-pay | Admitting: Neurology

## 2022-06-29 ENCOUNTER — Encounter: Payer: Self-pay | Admitting: Rheumatology

## 2022-06-29 MED ORDER — UBRELVY 100 MG PO TABS: 100.0000 mg | ORAL_TABLET | ORAL | 5 refills | Status: AC | PRN

## 2022-06-29 MED ORDER — HYDROXYCHLOROQUINE SULFATE 200 MG PO TABS: ORAL_TABLET | ORAL | 2 refills | Status: DC

## 2022-06-29 NOTE — Telephone Encounter (Signed)
Next Visit: 07/26/2022  Last Visit: 05/24/2022  Labs: 05/20/2022 CBC Wnl. Creatinine is borderline elevated-1.10-please clarify if she has been taking any NSAIDs? Rest of CMP WNL.  Eye exam: 1/22/204   Current Dose per office note 05/24/2022: Plaquenil 200 mg 1 tablet by mouth twice daily Monday through Friday only.     [id number] connective tissue disease   Last Fill: 04/20/2022  Okay to refill Plaquenil?

## 2022-06-29 NOTE — Telephone Encounter (Signed)
PA completed on CMM/optumrx KEY: [id number] Will await determination

## 2022-06-29 NOTE — Telephone Encounter (Signed)
PA is approved for 8 tablet per 30 days.  Refer [id number]

## 2022-07-02 ENCOUNTER — Ambulatory Visit: Payer: 59 | Admitting: Rheumatology

## 2022-07-02 LAB — BASIC METABOLIC PANEL
BUN: 16 (ref 4–21)
CO2: 23 — AB (ref 13–22)
Chloride: 107 (ref 99–108)
Creatinine: 1.2 — AB (ref 0.5–1.1)
Glucose: 93
Potassium: 4 mEq/L (ref 3.5–5.1)
Sodium: 140 (ref 137–147)

## 2022-07-02 LAB — COMPREHENSIVE METABOLIC PANEL
Albumin: 4.2 (ref 3.5–5.0)
eGFR: 58

## 2022-07-02 LAB — LAB REPORT - SCANNED
Albumin, Urine POC: 23.8
Albumin/Creatinine Ratio, Urine, POC: 8
Creatinine, POC: 296.9 mg/dL
EGFR: 58

## 2022-07-12 NOTE — Progress Notes (Deleted)
Office Visit Note  Patient: Jordan Mayer             Date of Birth: [birth year]           MRN: [id number]             PCP: Burnis Medin, MD Referring: Burnis Medin, MD Visit Date: 07/26/2022 Occupation: '@GUAROCC'$ @  Subjective:    History of Present Illness: LAAIBAH HOVELL is a 52 y.o. female with history of undifferentiated connective tissue disease.  Patient is taking plaquenil 200 mg 1 tablet by mouth twice daily Monday through Friday.   Lab work from 04/12/22 was reviewed today: ANA 1:40 nuclear, fine speckled, dsDNA negative, Ro-, La-, and complements WNL.     PLQ Eye Exam: 06/07/2022 WNL @ [facility] Follow up in 1 year  CMP updated on 07/02/22.   Activities of Daily Living:  Patient reports morning stiffness for *** {minute/hour:19697}.   Patient {ACTIONS;DENIES/REPORTS:21021675::"Denies"} nocturnal pain.  Difficulty dressing/grooming: {ACTIONS;DENIES/REPORTS:21021675::"Denies"} Difficulty climbing stairs: {ACTIONS;DENIES/REPORTS:21021675::"Denies"} Difficulty getting out of chair: {ACTIONS;DENIES/REPORTS:21021675::"Denies"} Difficulty using hands for taps, buttons, cutlery, and/or writing: {ACTIONS;DENIES/REPORTS:21021675::"Denies"}  No Rheumatology ROS completed.   PMFS History:  Patient Active Problem List   Diagnosis Date Noted   Positive ANA (antinuclear antibody) 04/12/2022   Genetic testing 07/21/2021   Family history of melanoma 07/13/2021   Varicose veins of leg with complications [id number]   Varicose veins of bilateral lower extremities with other complications 0000000   Vertigo 12/23/2011   Otalgia of both ears 09/17/2010   SLEEP DISORDER/DISTURBANCE 09/27/2008   FATIGUE 07/15/2008   ADHD 12/05/2007   HEADACHE 11/26/2007   ALLERGIC RHINITIS 10/13/2007   NEPHROLITHIASIS, HX OF 10/13/2007   NONSPECIFIC MESENTERIC LYMPHADENITIS 03/17/2007   RENAL CALCULUS, LEFT 03/17/2007   DEPRESSION 03/07/2007   DYSPNEA ON EXERTION  03/07/2007    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    Shingles     Family History  Problem Relation Age of Onset   Varicose Veins Mother    Asthma Mother    Myasthenia gravis Mother        Ocular   Varicose Veins Sister    Arthritis Maternal Grandmother    Arthritis Paternal Grandfather    Hearing loss Paternal Grandfather    Healthy Son    Healthy Son    Birth defects Paternal Aunt        Cognitively Challenged   Miscarriages / Stillbirths Paternal Aunt    Varicose Veins Paternal Aunt    Varicose Veins Paternal Aunt    Melanoma Paternal Aunt        melanoma x2   Prostate cancer Cousin    Colon cancer Neg Hx    Colon polyps Neg Hx    Esophageal cancer Neg Hx    Rectal cancer Neg Hx    Stomach cancer Neg Hx    Past Surgical History:  Procedure Laterality Date   BUNIONECTOMY Right    CHOLECYSTECTOMY     COLONOSCOPY     HIATAL HERNIA REPAIR     with gastric sleeve  LAPAROSCOPIC CHOLECYSTECTOMY SINGLE SITE WITH INTRAOPERATIVE CHOLANGIOGRAM N/A 06/14/2017   Procedure: LAPAROSCOPIC CHOLECYSTECTOMY SINGLE SITE;  Surgeon: Michael Boston, MD;  Location: [facility];  Service: General;  Laterality: N/A;   [place] RESECTION  09/2013   LASER ABLATION     Vascular   NASAL SINUS SURGERY  12/2021   TONSILLECTOMY     WISDOM TOOTH EXTRACTION     Social History   Social History Narrative   7 hours of sleep per night   Lives with her husband and 2 kids   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   Physical Exam Vitals and nursing note reviewed.  Constitutional:      Appearance: She is well-developed.  HENT:     Head: Normocephalic and atraumatic.  Eyes:     Conjunctiva/sclera: Conjunctivae normal.  Cardiovascular:     Rate and Rhythm: Normal rate and regular rhythm.     Heart sounds: Normal heart sounds.  Pulmonary:     Effort: Pulmonary effort is normal.     Breath sounds: Normal breath sounds.  Abdominal:     General: Bowel sounds are normal.     Palpations: Abdomen is soft.  Musculoskeletal:     Cervical back: Normal range of motion.  Lymphadenopathy:     Cervical: No cervical adenopathy.  Skin:    General: Skin is warm and dry.     Capillary Refill: Capillary refill takes less than 2 seconds.  Neurological:     Mental Status: She is alert and oriented to person, place, and time.  Psychiatric:        Behavior: Behavior normal.      Musculoskeletal Exam: ***  CDAI Exam: CDAI Score: -- Patient Global: --; Provider Global: -- Swollen: --; Tender: -- Joint Exam 07/26/2022   No joint exam has been documented for this visit   There is currently no information documented on the homunculus. Go to the Rheumatology activity and complete the homunculus joint exam.  Investigation: No additional findings.  Imaging: No results found.  Recent Labs: Lab Results  Component Value Date   WBC 4.8 05/20/2022   HGB 13.7 05/20/2022   PLT 240 05/20/2022   NA 139 05/20/2022   K 4.4 05/20/2022   CL 105 05/20/2022   CO2 26 05/20/2022   GLUCOSE 95 05/20/2022   BUN 15 05/20/2022   CREATININE 1.10 (H) 05/20/2022   BILITOT 0.3 05/20/2022   ALKPHOS 60 06/02/2021   AST 12 05/20/2022   ALT 13 05/20/2022   PROT 6.8 05/20/2022   ALBUMIN 4.2 06/02/2021   CALCIUM 9.2 05/20/2022   GFRAA >60 08/15/2019     Speciality Comments: PLQ Eye Exam: 06/07/2022 WNL @ [facility] Follow up in 1 year  Procedures:  No procedures performed Allergies: Contrast media [iodinated contrast media], Iohexol, Penicillins, and Gadolinium derivatives   Assessment / Plan:     Visit Diagnoses: No diagnosis found.  Orders: No orders of the defined types were placed in this encounter.  No orders of the defined types were placed in this encounter.   Face-to-face time spent with patient was *** minutes. Greater than 50% of  time was spent in counseling and coordination of care.  Follow-Up Instructions: No follow-ups on file.   Bo Merino, MD  Note - This record has been created using Editor, commissioning.  Chart creation errors have been sought, but may not always  have been located. Such creation errors do not reflect on  the standard of medical care.

## 2022-07-14 ENCOUNTER — Encounter: Payer: Self-pay | Admitting: Internal Medicine

## 2022-07-15 MED ORDER — PREDNISONE 5 MG PO TABS: ORAL_TABLET | ORAL | 0 refills | Status: DC

## 2022-07-15 NOTE — Telephone Encounter (Signed)
Ok to send in prednisone taper starting at 20 mg tapering by 5 mg every 4 days.   Avoid NSAID use.  Take prednisone with food.

## 2022-07-20 ENCOUNTER — Telehealth: Payer: Self-pay | Admitting: *Deleted

## 2022-07-20 NOTE — Telephone Encounter (Signed)
Labs received from:[facility]  Drawn on:07/02/2022  Reviewed by:Dr. Bo Merino   Labs drawn:MicroAlb/Creat. Ratio, UA, Renal Function Panel,   Results:Ua: Ketones 1+         Crystals Present          Calcium Oxalate           Mucus threads Present          Few Bacteria            Creat. 1.15          GFR 58          Chloride 107          Phosphorus 2.3  Patient on PLQ 200 mg po BID M-F

## 2022-07-26 ENCOUNTER — Ambulatory Visit: Payer: 59 | Admitting: Physician Assistant

## 2022-07-26 DIAGNOSIS — I83893 Varicose veins of bilateral lower extremities with other complications: Secondary | ICD-10-CM

## 2022-07-26 DIAGNOSIS — Z903 Acquired absence of stomach [part of]: Secondary | ICD-10-CM

## 2022-07-26 DIAGNOSIS — Z87442 Personal history of urinary calculi: Secondary | ICD-10-CM

## 2022-07-26 DIAGNOSIS — Z8269 Family history of other diseases of the musculoskeletal system and connective tissue: Secondary | ICD-10-CM

## 2022-07-26 DIAGNOSIS — R5383 Other fatigue: Secondary | ICD-10-CM

## 2022-07-26 DIAGNOSIS — Z79899 Other long term (current) drug therapy: Secondary | ICD-10-CM

## 2022-07-26 DIAGNOSIS — Z8659 Personal history of other mental and behavioral disorders: Secondary | ICD-10-CM

## 2022-07-26 DIAGNOSIS — M3500 Sicca syndrome, unspecified: Secondary | ICD-10-CM

## 2022-07-26 DIAGNOSIS — E559 Vitamin D deficiency, unspecified: Secondary | ICD-10-CM

## 2022-07-26 DIAGNOSIS — R7989 Other specified abnormal findings of blood chemistry: Secondary | ICD-10-CM

## 2022-07-26 DIAGNOSIS — M359 Systemic involvement of connective tissue, unspecified: Secondary | ICD-10-CM

## 2022-08-04 NOTE — Progress Notes (Unsigned)
Office Visit Note  Patient: Jordan Mayer             Date of Birth: [birth year]           MRN: [id number]             PCP: Burnis Medin, MD Referring: Burnis Medin, MD Visit Date: 08/18/2022 Occupation: @GUAROCC @  Subjective:  Fatigue   History of Present Illness: Jordan Mayer is a 52 y.o. female with history of undifferentiated connective tissue disease. She is taking Plaquenil 200 mg 1 tablet by mouth twice daily Monday through Friday only--started at end of November 2023.  She is to the rating Plaquenil without any side effects and has not missed any doses recently.  Patient reports that since initiating Plaquenil she has had less frequent rashes on her chest and last sores in her mouth.  She continues to have significant fatigue which she attributes to insomnia.  She has been taking trazodone 200 mg at bedtime but continues to have difficulty sleeping.  Last night she did not go to sleep.  She continues to experience intermittent pain and stiffness in both hands and both wrist joints.  She is also had intermittent discomfort in her ankle joints.  She notices intermittent swelling in her wrist.  She states that her joint pain was responsive to the prednisone taper sent in on 07/15/2022.  She continues to have sicca symptoms.  She has been using eyedrops and Biotene oral spray for symptomatic relief.   Activities of Daily Living:  Patient reports morning stiffness for 90 minutes.   Patient Reports nocturnal pain.  Difficulty dressing/grooming: Denies Difficulty climbing stairs: Reports Difficulty getting out of chair: Denies Difficulty using hands for taps, buttons, cutlery, and/or writing: Reports  Review of Systems  Constitutional:  Positive for fatigue.  HENT:  Positive for mouth dryness. Negative for mouth sores.   Eyes:  Positive for dryness.  Respiratory:  Negative for shortness of breath.   Cardiovascular:  Positive for palpitations.  Gastrointestinal:  Positive  for constipation. Negative for blood in stool and diarrhea.  Endocrine: Negative for increased urination.  Genitourinary:  Positive for involuntary urination.  Musculoskeletal:  Positive for joint pain, gait problem, joint pain, joint swelling, myalgias, muscle weakness, morning stiffness, muscle tenderness and myalgias.  Skin:  Positive for rash, hair loss and sensitivity to sunlight. Negative for color change.  Allergic/Immunologic: Positive for susceptible to infections.  Neurological:  Positive for dizziness and headaches.  Hematological:  Positive for swollen glands.  Psychiatric/Behavioral:  Positive for depressed mood and sleep disturbance. The patient is nervous/anxious.     PMFS History:  Patient Active Problem List   Diagnosis Date Noted   Positive ANA (antinuclear antibody) 04/12/2022   Genetic testing 07/21/2021   Family history of melanoma 07/13/2021   Varicose veins of leg with complications [id number]   Varicose veins of bilateral lower extremities with other complications 0000000   Vertigo 12/23/2011   Otalgia of both ears 09/17/2010   SLEEP DISORDER/DISTURBANCE 09/27/2008   FATIGUE 07/15/2008   ADHD 12/05/2007   HEADACHE 11/26/2007   ALLERGIC RHINITIS 10/13/2007   NEPHROLITHIASIS, HX OF 10/13/2007   NONSPECIFIC MESENTERIC LYMPHADENITIS 03/17/2007   RENAL CALCULUS, LEFT 03/17/2007   DEPRESSION 03/07/2007   DYSPNEA ON EXERTION 03/07/2007    Past Medical History:  Diagnosis Date   ADD (attention deficit disorder)    Allergy    Anxiety    Asthma    Chicken pox    Chronic  headaches    Depression    Family history of melanoma    Gallstones    Kidney stones    PCOS (polycystic ovarian syndrome)    Pneumonia    Renal disorder    kidney disease   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    Shingles     Family History  Problem Relation Age of Onset   Varicose Veins Mother    Asthma Mother    Myasthenia gravis Mother        Ocular   Varicose Veins Sister    Arthritis  Maternal Grandmother    Arthritis Paternal Grandfather    Hearing loss Paternal Grandfather    Healthy Son    Healthy Son    Birth defects Paternal Aunt        Cognitively Challenged   Miscarriages / Stillbirths Paternal Aunt    Varicose Veins Paternal Aunt    Varicose Veins Paternal Aunt    Melanoma Paternal Aunt        melanoma x2   Prostate cancer Cousin    Colon cancer Neg Hx    Colon polyps Neg Hx    Esophageal cancer Neg Hx    Rectal cancer Neg Hx    Stomach cancer Neg Hx    Past Surgical History:  Procedure Laterality Date   BUNIONECTOMY Right    CHOLECYSTECTOMY     COLONOSCOPY     HIATAL HERNIA REPAIR     with gastric sleeve   LAPAROSCOPIC CHOLECYSTECTOMY SINGLE SITE WITH INTRAOPERATIVE CHOLANGIOGRAM N/A 06/14/2017   Procedure: LAPAROSCOPIC CHOLECYSTECTOMY SINGLE SITE;  Surgeon: Michael Boston, MD;  Location: [facility];  Service: General;  Laterality: N/A;   Harris RESECTION  09/2013   LASER ABLATION     Vascular   NASAL SINUS SURGERY  12/2021   TONSILLECTOMY     WISDOM TOOTH EXTRACTION     Social History   Social History Narrative   7 hours of sleep per night   Lives with her husband and 2 kids   Does not work/Full Time Volunteer   Has a dog in the home   Caffeine- coffee 1 c but not daily   Immunization History  Administered Date(s) Administered   Influenza Inj Mdck Quad Pf 02/12/2019   Influenza,inj,Quad PF,6+ Mos 02/17/2014, 03/17/2014, 02/21/2017, 02/23/2018   Td 10/16/2007     Objective: Vital Signs: BP 118/86 (BP Location: Left Arm, Patient Position: Sitting, Cuff Size: Normal)   Pulse 82   Resp 12   Ht 5\' 5"  (1.651 m)   Wt 185 lb (83.9 kg)   LMP 08/08/2022   BMI 30.79 kg/m    Physical Exam Vitals and nursing note reviewed.  Constitutional:      Appearance: She is well-developed.  HENT:     Head: Normocephalic and atraumatic.  Eyes:     Conjunctiva/sclera: Conjunctivae normal.  Cardiovascular:     Rate and Rhythm:  Normal rate and regular rhythm.     Heart sounds: Normal heart sounds.  Pulmonary:     Effort: Pulmonary effort is normal.     Breath sounds: Normal breath sounds.  Abdominal:     General: Bowel sounds are normal.     Palpations: Abdomen is soft.  Musculoskeletal:     Cervical back: Normal range of motion.  Skin:    General: Skin is warm and dry.     Capillary Refill: Capillary refill takes less than 2 seconds.  Neurological:     Mental Status: She is alert and  oriented to person, place, and time.  Psychiatric:        Behavior: Behavior normal.      Musculoskeletal Exam: C-spine, thoracic spine, lumbar spine have good range of motion.  Shoulder joints, elbow joints, wrist joints, MCPs, PIPs, DIPs have good range of motion with no synovitis.  Complete fist formation bilaterally.  Hip joints have good range of motion with no groin pain.  Knee joints have good range of motion with no warmth or effusion.  Ankle joints have good range of motion with no tenderness or joint swelling.  CDAI Exam: CDAI Score: -- Patient Global: --; Provider Global: -- Swollen: --; Tender: -- Joint Exam 08/18/2022   No joint exam has been documented for this visit   There is currently no information documented on the homunculus. Go to the Rheumatology activity and complete the homunculus joint exam.  Investigation: No additional findings.  Imaging: No results found.  Recent Labs: Lab Results  Component Value Date   WBC 5.7 08/16/2022   HGB 13.5 08/16/2022   PLT 314 08/16/2022   NA 140 08/16/2022   K 3.8 08/16/2022   CL 109 08/16/2022   CO2 23 08/16/2022   GLUCOSE 125 (H) 08/16/2022   BUN 13 08/16/2022   CREATININE 0.94 08/16/2022   BILITOT 0.2 08/16/2022   ALKPHOS 60 06/02/2021   AST 11 08/16/2022   ALT 12 08/16/2022   PROT 7.0 08/16/2022   ALBUMIN 4.2 07/02/2022   CALCIUM 9.0 08/16/2022   GFRAA >60 08/15/2019    Speciality Comments: PLQ Eye Exam: 06/07/2022 WNL @ [facility] Follow up in 1 year  Procedures:  No procedures performed Allergies: Contrast media [iodinated contrast media], Iohexol, Penicillins, and Gadolinium derivatives       Assessment / Plan:     Visit Diagnoses: Undifferentiated connective tissue disease - December 10, 2021 urine protein creatinine ratio normal, ANA 1: 40NH, ENA negative, beta-2 GP 1 negative, anticardiolipin negative, C3-C4 normal, RF negative: Patient has been taking Plaquenil 200 mg 1 tablet by mouth twice daily Monday through Friday.  She has been tolerating Plaquenil without any side effects and has not missed any doses recently.  She has noticed less oral ulcers and less rashes on her chest since initiating Plaquenil.  She continues to have significant fatigue, arthralgias, joint stiffness, sicca symptoms, and intermittent facial rashes.  A prednisone taper was sent to the pharmacy on 07/15/2022 at which time her symptoms were responsive.  She has had a recurrence of pain and stiffness in both hands but on examination today she has no synovitis.  Offered to schedule an ultrasound of both hands to assess for synovitis but she would like to hold off at this time.  Her level of fatigue secondary to insomnia has been significantly impacting her quality of life.  She has been taking trazodone 200 mg at bedtime but continues to have significant difficulty sleeping at night.  Patient requested to have vitamin B12 checked today.   Lab work from 04/12/22 was reviewed today in the office: ANA 1:40, Ro-, La-, dsDNA negative, and complements WNL.  The following lab work will be updated today for further evaluation.  TPMT was added in case we need to consider adding Imuran in the future.  Patient was curious about the use of Benlysta specifically for fatigue.  Discussed that she does not currently meet clinical criteria for systemic lupus requiring more aggressive treatment at this time.  She has an upcoming appointment scheduled at [facility]  rheumatology with Dr. Amil Amen.  Offered to send records to Dr. Amil Amen once requested.  She is willing to give Plaquenil more time.  She will follow-up in the office in 3 months or sooner if needed.- Plan: Protein / creatinine ratio, urine, C3 and C4, Anti-DNA antibody, double-stranded, Sedimentation rate, ANA, Anti-Smith antibody, RNP Antibody, Sjogrens syndrome-A extractable nuclear antibody, Sjogrens syndrome-B extractable nuclear antibody  High risk medication use - Plaquenil 200 mg 1 tablet by mouth twice daily Monday through Friday only--started at end of November 2023. PLQ Eye Exam: 06/07/2022 WNL @ [facility] Follow up in 1 year  CBC and CMP updated on 08/16/22.  Plan to check TPMT in case Imuran will be considered in the future.  Plan to try to avoid methotrexate given history of elevated creatinine and low GFR.  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  Ro and La antibodies negative on 04/12/2022.  Ro and La antibodies rechecked today.  She has been using eyedrops and Biotene oral spray for symptomatic relief.  Elevated serum creatinine: Creatinine was 0.94 and GFR was 73 on 08/16/2022.  Discussed the importance of avoiding NSAID use.  Vitamin D deficiency: She is taking vitamin D 2000 units daily.  Other fatigue - She continues to have significant fatigue on a daily basis.  Her level of fatigue has been significantly impacting her quality of life.  She continues to have difficulty sleeping at night despite taking trazodone 200 mg at bedtime.   Patient requested to have vitamin B12 level checked today.  Discussed the importance of regular exercise and good sleep hygiene.  Plan: Vitamin B12  Vitamin B12 deficiency -Vitamin B12 will be checked today. plan: Vitamin B12  Other medical conditions are listed as follows:  S/P gastric sleeve procedure  Varicose veins of bilateral lower extremities with other  complications  History of depression  History of nephrolithiasis  History of ADHD  History of PCOS  Family history of Sjogren's disease-Sister    Orders: Orders Placed This Encounter  Procedures   Vitamin B12   Protein / creatinine ratio, urine   C3 and C4   Anti-DNA antibody, double-stranded   Sedimentation rate   ANA   Anti-Smith antibody   RNP Antibody   Sjogrens syndrome-A extractable nuclear antibody   Sjogrens syndrome-B extractable nuclear antibody   Thiopurine methyltransferase(tpmt)rbc   Meds ordered this encounter  Medications   ondansetron (ZOFRAN-ODT) 8 MG disintegrating tablet    Sig: Take 1 tablet (8 mg total) by mouth every 8 (eight) hours as needed for nausea or vomiting.    Dispense:  20 tablet    Refill:  0   hydroxychloroquine (PLAQUENIL) 200 MG tablet    Sig: Take 200mg  by mouth twice daily, Monday through Friday only. None on Saturday or Sunday.    Dispense:  120 tablet    Refill:  0     Follow-Up Instructions: Return in about 3 months (around 11/17/2022) for UCTD .   Ofilia Neas, PA-C  Note - This record has been created using Dragon software.  Chart creation errors have been sought, but may not always  have been located. Such creation errors do not reflect on  the standard of medical care.

## 2022-08-11 ENCOUNTER — Other Ambulatory Visit: Payer: Self-pay | Admitting: *Deleted

## 2022-08-11 DIAGNOSIS — Z79899 Other long term (current) drug therapy: Secondary | ICD-10-CM

## 2022-08-17 LAB — CBC WITH DIFFERENTIAL/PLATELET
Absolute Monocytes: 781 cells/uL (ref 200–950)
Basophils Absolute: 11 cells/uL (ref 0–200)
Basophils Relative: 0.2 %
Eosinophils Absolute: 239 cells/uL (ref 15–500)
Eosinophils Relative: 4.2 %
HCT: 40.4 % (ref 35.0–45.0)
Hemoglobin: 13.5 g/dL (ref 11.7–15.5)
Lymphs Abs: 1653 cells/uL (ref 850–3900)
MCH: 28.4 pg (ref 27.0–33.0)
MCHC: 33.4 g/dL (ref 32.0–36.0)
MCV: 84.9 fL (ref 80.0–100.0)
MPV: 9.3 fL (ref 7.5–12.5)
Monocytes Relative: 13.7 %
Neutro Abs: 3015 cells/uL (ref 1500–7800)
Neutrophils Relative %: 52.9 %
Platelets: 314 10*3/uL (ref 140–400)
RBC: 4.76 10*6/uL (ref 3.80–5.10)
RDW: 12.9 % (ref 11.0–15.0)
Total Lymphocyte: 29 %
WBC: 5.7 10*3/uL (ref 3.8–10.8)

## 2022-08-17 LAB — COMPLETE METABOLIC PANEL WITH GFR
AG Ratio: 1.3 (calc) (ref 1.0–2.5)
ALT: 12 U/L (ref 6–29)
AST: 11 U/L (ref 10–35)
Albumin: 4 g/dL (ref 3.6–5.1)
Alkaline phosphatase (APISO): 67 U/L (ref 37–153)
BUN: 13 mg/dL (ref 7–25)
CO2: 23 mmol/L (ref 20–32)
Calcium: 9 mg/dL (ref 8.6–10.4)
Chloride: 109 mmol/L (ref 98–110)
Creat: 0.94 mg/dL (ref 0.50–1.03)
Globulin: 3 g/dL (calc) (ref 1.9–3.7)
Glucose, Bld: 125 mg/dL — ABNORMAL HIGH (ref 65–99)
Potassium: 3.8 mmol/L (ref 3.5–5.3)
Sodium: 140 mmol/L (ref 135–146)
Total Bilirubin: 0.2 mg/dL (ref 0.2–1.2)
Total Protein: 7 g/dL (ref 6.1–8.1)
eGFR: 73 mL/min/{1.73_m2} (ref >=60)

## 2022-08-17 NOTE — Progress Notes (Signed)
CBC is normal, CMP shows mildly elevated glucose probably not a fasting sample.

## 2022-08-18 ENCOUNTER — Encounter: Payer: Self-pay | Admitting: Physician Assistant

## 2022-08-18 ENCOUNTER — Ambulatory Visit: Payer: 59 | Attending: Physician Assistant | Admitting: Physician Assistant

## 2022-08-18 VITALS — BP 118/86 | HR 82 | Resp 12 | Ht 65.0 in | Wt 185.0 lb

## 2022-08-18 DIAGNOSIS — M3500 Sicca syndrome, unspecified: Secondary | ICD-10-CM

## 2022-08-18 DIAGNOSIS — E538 Deficiency of other specified B group vitamins: Secondary | ICD-10-CM

## 2022-08-18 DIAGNOSIS — M359 Systemic involvement of connective tissue, unspecified: Secondary | ICD-10-CM

## 2022-08-18 DIAGNOSIS — Z87442 Personal history of urinary calculi: Secondary | ICD-10-CM

## 2022-08-18 DIAGNOSIS — R5383 Other fatigue: Secondary | ICD-10-CM

## 2022-08-18 DIAGNOSIS — Z8269 Family history of other diseases of the musculoskeletal system and connective tissue: Secondary | ICD-10-CM

## 2022-08-18 DIAGNOSIS — Z8659 Personal history of other mental and behavioral disorders: Secondary | ICD-10-CM

## 2022-08-18 DIAGNOSIS — Z79899 Other long term (current) drug therapy: Secondary | ICD-10-CM

## 2022-08-18 DIAGNOSIS — R7989 Other specified abnormal findings of blood chemistry: Secondary | ICD-10-CM | POA: Diagnosis not present

## 2022-08-18 DIAGNOSIS — E559 Vitamin D deficiency, unspecified: Secondary | ICD-10-CM

## 2022-08-18 DIAGNOSIS — Z903 Acquired absence of stomach [part of]: Secondary | ICD-10-CM

## 2022-08-18 DIAGNOSIS — I83893 Varicose veins of bilateral lower extremities with other complications: Secondary | ICD-10-CM

## 2022-08-18 DIAGNOSIS — Z8742 Personal history of other diseases of the female genital tract: Secondary | ICD-10-CM

## 2022-08-18 MED ORDER — HYDROXYCHLOROQUINE SULFATE 200 MG PO TABS: ORAL_TABLET | ORAL | 0 refills | Status: AC

## 2022-08-18 MED ORDER — ONDANSETRON 8 MG PO TBDP: 8.0000 mg | ORAL_TABLET | Freq: Three times a day (TID) | ORAL | 0 refills | Status: DC | PRN

## 2022-08-19 NOTE — Progress Notes (Signed)
ESR WNL Vitamin B12 is on the lower end of normal.  She may benefit from supplementation given her severity of fatigue.   Protein creatinine ratio WNL.

## 2022-08-19 NOTE — Progress Notes (Signed)
Ro and La negative.  RNP negative.  Smith negative.  Double-stranded and negative.  Complements within normal limits.

## 2022-08-22 NOTE — Progress Notes (Signed)
ANA remains positive-low titer.

## 2022-08-23 MED ORDER — FLUCONAZOLE 150 MG PO TABS: ORAL_TABLET | ORAL | 0 refills | Status: DC

## 2022-08-23 MED ORDER — CLOTRIMAZOLE 1 % EX CREA: 1.0000 | TOPICAL_CREAM | Freq: Two times a day (BID) | CUTANEOUS | 0 refills | Status: DC

## 2022-08-23 NOTE — Telephone Encounter (Addendum)
Advised patient that per Ladona Ridgel, The rash looks consistent with a yeast infection. Patient would like to proceed with a prescription for oral diflucan. Patient uses [facility] on [address].

## 2022-08-23 NOTE — Telephone Encounter (Signed)
The rash looks consistent with a yeast infection.  Please clarify if she would like a prescription for nystatin powder or a prescription for oral Diflucan to clear the fungal infection.  She can also seek treatment from her PCP if she would like to.

## 2022-08-24 NOTE — Progress Notes (Unsigned)
No chief complaint on file.   HPI: Jordan Mayer 52 y.o. come in for She has undifferentialted connective tissue disease followed by rheum  ROS: See pertinent positives and negatives per HPI.  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    Shingles     Family History  Problem Relation Age of Onset   Varicose Veins Mother    Asthma Mother    Myasthenia gravis Mother        Ocular   Varicose Veins Sister    Arthritis Maternal Grandmother    Arthritis Paternal Grandfather    Hearing loss Paternal Grandfather    Healthy Son    Healthy Son    Birth defects Paternal Aunt        Cognitively Challenged   Miscarriages / Stillbirths Paternal Aunt    Varicose Veins Paternal Aunt    Varicose Veins Paternal Aunt    Melanoma Paternal Aunt        melanoma x2   Prostate cancer Cousin    Colon cancer Neg Hx    Colon polyps Neg Hx    Esophageal cancer Neg Hx    Rectal cancer Neg Hx    Stomach cancer Neg Hx     Social History   Socioeconomic History   Marital status: Married    Spouse name: Casimiro Needle   Number of children: 2   Years of education: Not on file   Highest education level: Master's degree (e.g., MA, MS, MEng, MEd, MSW, MBA)  Occupational History   Occupation: Teaching laboratory technician  Tobacco Use   Smoking status: Never    Passive exposure: Never   Smokeless tobacco: Never  Vaping Use   Vaping Use: Never used  Substance and Sexual Activity   Alcohol use: Yes    Comment: occasional, 1-2 monthly   Drug use: No   Sexual activity: Yes    Birth control/protection: None    Comment: husband had vasectomy  Other Topics Concern   Not on file  Social History Narrative   7  hours of sleep per night   Lives with her husband and 2 kids   Does not work/Full Time Volunteer   Has a dog in the home   Caffeine- coffee 1 c but not daily   Social Determinants of Health   Financial Resource Strain: Not on file  Food Insecurity: Not on file  Transportation Needs: Not on file  Physical Activity: Not on file  Stress: Not on file  Social Connections: Not on file    Outpatient Medications Prior to Visit  Medication Sig Dispense Refill   albuterol (VENTOLIN HFA) 108 (90 Base) MCG/ACT inhaler Inhale into the lungs as needed. (Patient not taking: Reported on 08/18/2022)     Cholecalciferol (D3 PO) Take by mouth. 2000 mg daily     clotrimazole (LOTRIMIN) 1 % cream Apply 1 Application topically 2 (two) times daily. 30 g 0   Dapsone 7.5 % GEL SMARTSIG:Sparingly Topical Every Night     EPINEPHrine 0.3 mg/0.3 mL IJ SOAJ injection 0.3 mL.  EVEKEO 10 MG TABS Take by mouth. Take 2 tablets in the morning and 1 in the afternoon     fluconazole (DIFLUCAN) 150 MG tablet Take 1 tablet by mouth every other day for a total of 3 doses. 3 tablet 0   fluticasone (FLONASE) 50 MCG/ACT nasal spray Place 1 spray into both nostrils daily.     Galcanezumab-gnlm (EMGALITY) 120 MG/ML SOAJ Inject 120 mg into the skin every 30 (thirty) days. 1.12 mL 6   [name] 24 FE 1-20 MG-MCG(24) tablet Take 1 tablet by mouth daily.     hydroxychloroquine (PLAQUENIL) 200 MG tablet Take 200mg  by mouth twice daily, Monday through Friday only. None on Saturday or Sunday. 120 tablet 0   Multiple Vitamin (MULTIVITAMIN) tablet Take 1 tablet by mouth daily.     ondansetron (ZOFRAN-ODT) 8 MG disintegrating tablet Take 1 tablet (8 mg total) by mouth every 8 (eight) hours as needed for nausea or vomiting. 20 tablet 0   predniSONE (DELTASONE) 5 MG tablet Take 4 tabs po x 4 days, 3  tabs po x 4 days, 2  tabs po x 4 days, 1  tab po x 4 days (Patient not taking: Reported on 08/18/2022) 40 tablet 0   spironolactone (ALDACTONE)  100 MG tablet Take 100 mg by mouth 2 (two) times daily.     traZODone (DESYREL) 100 MG tablet Take 200 mg by mouth at bedtime.     UBRELVY 100 MG TABS Take by mouth.     venlafaxine (EFFEXOR) 37.5 MG tablet Take 37.5 mg by mouth 2 (two) times daily.     No facility-administered medications prior to visit.     EXAM:  LMP 08/08/2022   There is no height or weight on file to calculate BMI.  GENERAL: vitals reviewed and listed above, alert, oriented, appears well hydrated and in no acute distress HEENT: atraumatic, conjunctiva  clear, no obvious abnormalities on inspection of external nose and ears OP : no lesion edema or exudate  NECK: no obvious masses on inspection palpation  LUNGS: clear to auscultation bilaterally, no wheezes, rales or rhonchi, good air movement CV: HRRR, no clubbing cyanosis or  peripheral edema nl cap refill  MS: moves all extremities without noticeable focal  abnormality PSYCH: pleasant and cooperative, no obvious depression or anxiety Lab Results  Component Value Date   WBC 5.7 08/16/2022   HGB 13.5 08/16/2022   HCT 40.4 08/16/2022   PLT 314 08/16/2022   GLUCOSE 125 (H) 08/16/2022   CHOL 187 06/12/2020   TRIG 115 06/02/2021   HDL 46.60 06/12/2020   LDLCALC 102 (H) 06/12/2020   ALT 12 08/16/2022   AST 11 08/16/2022   NA 140 08/16/2022   K 3.8 08/16/2022   CL 109 08/16/2022   CREATININE 0.94 08/16/2022   BUN 13 08/16/2022   CO2 23 08/16/2022   TSH 3.62 12/10/2021   HGBA1C 5.5 06/12/2020   BP Readings from Last 3 Encounters:  08/18/22 118/86  06/22/22 114/80  05/24/22 112/80    ASSESSMENT AND PLAN:  Discussed the following assessment and plan:  No diagnosis found.  -Patient advised to return or notify health care team  if  new concerns arise.  There are no Patient Instructions on file for this visit.   Neta Mends. Wateen Varon M.D.

## 2022-08-25 ENCOUNTER — Encounter: Payer: Self-pay | Admitting: Internal Medicine

## 2022-08-25 ENCOUNTER — Ambulatory Visit: Payer: 59 | Admitting: Internal Medicine

## 2022-08-25 VITALS — BP 126/84 | HR 93 | Temp 98.4°F | Resp 16 | Ht 65.0 in | Wt 189.5 lb

## 2022-08-25 DIAGNOSIS — E282 Polycystic ovarian syndrome: Secondary | ICD-10-CM

## 2022-08-25 DIAGNOSIS — E669 Obesity, unspecified: Secondary | ICD-10-CM | POA: Diagnosis not present

## 2022-08-25 DIAGNOSIS — R5383 Other fatigue: Secondary | ICD-10-CM | POA: Diagnosis not present

## 2022-08-25 DIAGNOSIS — M328 Other forms of systemic lupus erythematosus: Secondary | ICD-10-CM

## 2022-08-25 DIAGNOSIS — R739 Hyperglycemia, unspecified: Secondary | ICD-10-CM

## 2022-08-25 DIAGNOSIS — Z23 Encounter for immunization: Secondary | ICD-10-CM

## 2022-08-25 DIAGNOSIS — E538 Deficiency of other specified B group vitamins: Secondary | ICD-10-CM

## 2022-08-25 DIAGNOSIS — Z9884 Bariatric surgery status: Secondary | ICD-10-CM

## 2022-08-25 LAB — POCT GLYCOSYLATED HEMOGLOBIN (HGB A1C): Hemoglobin A1C: 5.7 % — AB (ref 4.0–5.6)

## 2022-08-25 NOTE — Patient Instructions (Addendum)
Suggest   B12 sublingual  1000 mcg per day ( may come in 2000 mcg per day )   Then check level in 2-3 months somewhere.    Shingrix 2 ok today   Ask insurance if covers  Zepbound or [id number] for weight loss with comorbidity .  Let me know and we can send in .

## 2022-08-26 ENCOUNTER — Encounter: Payer: Self-pay | Admitting: Internal Medicine

## 2022-08-26 DIAGNOSIS — R739 Hyperglycemia, unspecified: Secondary | ICD-10-CM

## 2022-08-26 DIAGNOSIS — E669 Obesity, unspecified: Secondary | ICD-10-CM

## 2022-08-30 ENCOUNTER — Encounter: Payer: Self-pay | Admitting: *Deleted

## 2022-09-03 LAB — C3 AND C4
C3 Complement: 193 mg/dL (ref 83–193)
C4 Complement: 30 mg/dL (ref 15–57)

## 2022-09-03 LAB — THIOPURINE METHYLTRANSFERASE (TPMT), RBC: Thiopurine Methyltransferase, RBC: 14 nmol/hr/mL RBC

## 2022-09-03 LAB — PROTEIN / CREATININE RATIO, URINE
Creatinine, Urine: 202 mg/dL (ref 20–275)
Protein/Creat Ratio: 119 mg/g creat (ref 24–184)
Protein/Creatinine Ratio: 0.119 mg/mg creat (ref 0.024–0.184)
Total Protein, Urine: 24 mg/dL (ref 5–24)

## 2022-09-03 LAB — SEDIMENTATION RATE: Sed Rate: 17 mm/h (ref 0–30)

## 2022-09-03 LAB — SJOGRENS SYNDROME-B EXTRACTABLE NUCLEAR ANTIBODY: SSB (La) (ENA) Antibody, IgG: <1 AI

## 2022-09-03 LAB — ANA: Anti Nuclear Antibody (ANA): POSITIVE — AB

## 2022-09-03 LAB — VITAMIN B12: Vitamin B-12: 231 pg/mL (ref 200–1100)

## 2022-09-03 LAB — RNP ANTIBODY: Ribonucleic Protein(ENA) Antibody, IgG: <1 AI

## 2022-09-03 LAB — SJOGRENS SYNDROME-A EXTRACTABLE NUCLEAR ANTIBODY: SSA (Ro) (ENA) Antibody, IgG: <1 AI

## 2022-09-03 LAB — ANTI-NUCLEAR AB-TITER (ANA TITER): ANA Titer 1: 1:40 {titer} — ABNORMAL HIGH

## 2022-09-03 LAB — ANTI-DNA ANTIBODY, DOUBLE-STRANDED: ds DNA Ab: 1 IU/mL

## 2022-09-03 LAB — ANTI-SMITH ANTIBODY: ENA SM Ab Ser-aCnc: <1 AI

## 2022-09-04 ENCOUNTER — Other Ambulatory Visit: Payer: Self-pay | Admitting: Psychiatry

## 2022-09-06 NOTE — Progress Notes (Signed)
TPMT WNL.

## 2022-09-06 NOTE — Telephone Encounter (Signed)
Last seen on 01/04/22 per note "Prevention: Continue Emgality monthly injection " Follow up scheduled on 12/28/22 Last filled on 08/03/22 #80ml ( 28 day supply)

## 2022-09-09 NOTE — Telephone Encounter (Signed)
Pt is calling checking on the message she sent md. Pleae advise

## 2022-09-12 NOTE — Telephone Encounter (Signed)
Zepbound is the weight management version of  mounjaro can send in 2.5 mg weekly disp 1 month  for hyperglycemia and obesity  ( doubt if this will be approved  but you can contact company to see if any assistance )

## 2022-09-14 MED ORDER — ZEPBOUND 2.5 MG/0.5ML ~~LOC~~ SOAJ
2.5000 mg | SUBCUTANEOUS | 0 refills | Status: DC
Start: 2022-09-14 — End: 2022-12-29

## 2022-09-14 NOTE — Telephone Encounter (Signed)
Contacted pharmacy. They confirm they have the medication on file.  PA was submitted. Jordan Mayer (Key: [id number]) PA Case ID #: [id number]

## 2022-09-22 ENCOUNTER — Telehealth: Payer: Self-pay

## 2022-09-22 NOTE — Telephone Encounter (Signed)
Patient Advocate Encounter  Received a fax from [employer] regarding Prior Authorization for Zepbound Inj 2.5mg .   Authorization has been DENIED due to    Determination letter attached to patient chart

## 2022-09-23 LAB — HM MAMMOGRAPHY

## 2022-09-23 NOTE — Telephone Encounter (Signed)
Tell patient denial

## 2022-09-23 NOTE — Telephone Encounter (Signed)
Attempted to reach pt. Left a voicemail to call us back.  

## 2022-09-24 NOTE — Telephone Encounter (Signed)
Patient returned call

## 2022-09-24 NOTE — Telephone Encounter (Signed)
Pt is aware of status for a Zepbound. pt states no follow up questions or concerns. Just stated that she won't be using it as price is costly for her.   Forwarding to provider for FYI.

## 2022-11-25 ENCOUNTER — Ambulatory Visit: Payer: 59 | Admitting: Physician Assistant

## 2022-12-28 ENCOUNTER — Telehealth: Payer: Self-pay | Admitting: Adult Health

## 2022-12-28 ENCOUNTER — Ambulatory Visit: Payer: 59 | Admitting: Adult Health

## 2022-12-28 ENCOUNTER — Encounter: Payer: Self-pay | Admitting: Adult Health

## 2022-12-28 NOTE — Telephone Encounter (Signed)
Pt's husband called to cancel pt's appt due to pt may have Covid.

## 2022-12-28 NOTE — Progress Notes (Deleted)
Primary neurologist: Dr. Delena Bali   CC:  headaches  History provided from self  Follow-up visit:  Prior visit: 06/22/2022  No chief complaint on file.    Brief HPI:   Jordan Mayer is a 52 y.o. female who is being seen for follow-up regarding worsening migraine headaches reporting migraines every day at initial visit on 01/04/2022. MR brain 01/2022 unremarkable.  At prior visit, patient continued on Emgality for prevention and started Ubrelvy for rescue as no benefit with Nurtec. Patient c/o vertigo type symptoms over the past several months, she question possibly due to underlying rheumatologic diseases and possible Plaquenil side effect (started around the time of onset), neuro exam intact, declined interest in vestibular rehab and she wished to speak further with rheumatology    Interval history:     Reports improvement of migraine headaches since initiating Emgality currently experiencing 2 migraine days per month, can worsen with storms.  Will use Nurtec without any great benefit but the next day will have more fatigue type symptoms. Migraines can last at least 24 hours.   She does complain of vertigo type symptoms over the past several months. She does question if underlying rheumatologic diseases are contributing and use of Plaquenil as this was started a few months ago, she plans on further discussing with Dr. Corliss Skains at follow-up visit next week. Reports vertigo/dizziness sensation worse when walking (will feel like she is swaying from side-to-side), difficulty closing eyes when standing, mild vertigo with quick head movements. No improvement with laying down, still feels like everything is moving. Denies lightheadedness/syncopal sensation.  Does have chronic neck pain, was told pinched nerve at C5-6, was having issues with right arm radiculopathy but has not experienced the symptoms in several years.    Headache History: Onset: several years Triggers: changes in  weather Aura: blurry Location: occipital, temples Quality/Description: squeezing, stabbing, throbbing, pressure Associated Symptoms:  Photophobia: yes  Phonophobia: yes  Nausea: yes, takes Zofran as needed Worse with activity?: yes Duration of headaches: 24 hours  Headache days per month: 2 Headache free days per month: 28  Current Treatment: Abortive Tylenol  Preventative Emgality  Prior Therapies                                 Tylenol Imitrex 50-100 mg PRN - side effects Maxalt 10 mg PRN - side effects Nurtec  Effexor Topamax - lack of efficacy, kidney stones Neck PT  LABS: CBC    Component Value Date/Time   WBC 5.7 08/16/2022 0853   RBC 4.76 08/16/2022 0853   HGB 13.5 08/16/2022 0853   HCT 40.4 08/16/2022 0853   PLT 314 08/16/2022 0853   MCV 84.9 08/16/2022 0853   MCH 28.4 08/16/2022 0853   MCHC 33.4 08/16/2022 0853   RDW 12.9 08/16/2022 0853   LYMPHSABS 1,653.0 08/16/2022 0853   MONOABS 0.8 11/12/2021 1236   EOSABS 239 08/16/2022 0853   BASOSABS 11 08/16/2022 0853      Latest Ref Rng & Units 08/16/2022    8:53 AM 07/02/2022   12:00 AM 05/20/2022    1:49 PM  CMP  Glucose 65 - 99 mg/dL 161   95   BUN 7 - 25 mg/dL 13  16     15    Creatinine 0.50 - 1.03 mg/dL 0.96  1.2     0.45   Sodium 135 - 146 mmol/L 140  140     139  Potassium 3.5 - 5.3 mmol/L 3.8  4.0     4.4   Chloride 98 - 110 mmol/L 109  107     105   CO2 20 - 32 mmol/L 23  23     26    Calcium 8.6 - 10.4 mg/dL 9.0   9.2   Total Protein 6.1 - 8.1 g/dL 7.0   6.8   Total Bilirubin 0.2 - 1.2 mg/dL 0.2   0.3   AST 10 - 35 U/L 11   12   ALT 6 - 29 U/L 12   13      This result is from an external source.     IMAGING:  [facility] 11/12/21: partially empty sella, otherwise unremarkable  Imaging independently reviewed on December 28, 2022   Current Outpatient Medications on File Prior to Visit  Medication Sig Dispense Refill   albuterol (VENTOLIN HFA) 108 (90 Base) MCG/ACT inhaler Inhale into the lungs  as needed. (Patient not taking: Reported on 08/18/2022)     belimumab 10 mg/kg in sodium chloride 0.9 % Inject 10 mg/kg into the vein once.     Cholecalciferol (D3 PO) Take by mouth. 2000 mg daily     clotrimazole (LOTRIMIN) 1 % cream Apply 1 Application topically 2 (two) times daily. 30 g 0   Cyanocobalamin 2000 MCG/ML SOLN Inject as directed. 1 mL into skin once weekly x4, then once monthly     Dapsone 7.5 % GEL SMARTSIG:Sparingly Topical Every Night     EPINEPHrine 0.3 mg/0.3 mL IJ SOAJ injection 0.3 mL. (Patient not taking: Reported on 08/25/2022)     EVEKEO 10 MG TABS Take by mouth. Take 2 tablets in the morning and 1 in the afternoon     fluconazole (DIFLUCAN) 150 MG tablet Take 1 tablet by mouth every other day for a total of 3 doses. 3 tablet 0   fluticasone (FLONASE) 50 MCG/ACT nasal spray Place 1 spray into both nostrils daily.     Galcanezumab-gnlm (EMGALITY) 120 MG/ML SOAJ ADMINISTER 1 ML UNDER THE SKIN EVERY 30 DAYS 1 mL 3   [name] 24 FE 1-20 MG-MCG(24) tablet Take 1 tablet by mouth daily.     hydroxychloroquine (PLAQUENIL) 200 MG tablet Take 200mg  by mouth twice daily, Monday through Friday only. None on Saturday or Sunday. 120 tablet 0   Multiple Vitamin (MULTIVITAMIN) tablet Take 1 tablet by mouth daily.     ondansetron (ZOFRAN-ODT) 8 MG disintegrating tablet Take 1 tablet (8 mg total) by mouth every 8 (eight) hours as needed for nausea or vomiting. 20 tablet 0   predniSONE (DELTASONE) 5 MG tablet Take 4 tabs po x 4 days, 3  tabs po x 4 days, 2  tabs po x 4 days, 1  tab po x 4 days (Patient not taking: Reported on 08/18/2022) 40 tablet 0   spironolactone (ALDACTONE) 100 MG tablet Take 100 mg by mouth daily.     tirzepatide (ZEPBOUND) 2.5 MG/0.5ML Pen Inject 2.5 mg into the skin once a week. 2 mL 0   traZODone (DESYREL) 100 MG tablet Take 200 mg by mouth at bedtime.     UBRELVY 100 MG TABS Take by mouth.     venlafaxine (EFFEXOR) 37.5 MG tablet Take 37.5 mg by mouth 2 (two) times daily.      No current facility-administered medications on file prior to visit.     Allergies: Allergies  Allergen Reactions   Contrast Media [Iodinated Contrast Media]    Iohexol  Code: HIVES, Desc: 1 hive developed on hand post injection of 125cc's Omni 300., Onset Date: [id number]    Penicillins Other (See Comments)    Has patient had a PCN reaction causing immediate rash, facial/tongue/throat swelling, SOB or lightheadedness with hypotension: Yes Has patient had a PCN reaction causing severe rash involving mucus membranes or skin necrosis: No Has patient had a PCN reaction that required hospitalization: No Has patient had a PCN reaction occurring within the last 10 years: No If all of the above answers are "NO", then may proceed with Cephalosporin use.   Gadolinium Derivatives Hives, Itching and Rash    Family History: Family History  Problem Relation Age of Onset   Varicose Veins Mother    Asthma Mother    Myasthenia gravis Mother        Ocular   Varicose Veins Sister    Arthritis Maternal Grandmother    Arthritis Paternal Grandfather    Hearing loss Paternal Grandfather    Healthy Son    Healthy Son    Birth defects Paternal Aunt        Cognitively Challenged   Miscarriages / Stillbirths Paternal Aunt    Varicose Veins Paternal Aunt    Varicose Veins Paternal Aunt    Melanoma Paternal Aunt        melanoma x2   Prostate cancer Cousin    Colon cancer Neg Hx    Colon polyps Neg Hx    Esophageal cancer Neg Hx    Rectal cancer Neg Hx    Stomach cancer Neg Hx     Past Medical History: 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    Shingles     Past Surgical History Past  Surgical History:  Procedure Laterality Date   BUNIONECTOMY Right    CHOLECYSTECTOMY     COLONOSCOPY     HIATAL HERNIA REPAIR     with gastric sleeve   LAPAROSCOPIC CHOLECYSTECTOMY SINGLE SITE WITH INTRAOPERATIVE CHOLANGIOGRAM N/A 06/14/2017   Procedure: LAPAROSCOPIC CHOLECYSTECTOMY SINGLE SITE;  Surgeon: Karie Soda, MD;  Location: [facility];  Service: General;  Laterality: N/A;   LAPAROSCOPIC GASTRIC SLEEVE RESECTION  09/2013   LASER ABLATION     Vascular   NASAL SINUS SURGERY  12/2021   TONSILLECTOMY     WISDOM TOOTH EXTRACTION      Social History: Social History   Tobacco Use   Smoking status: Never    Passive exposure: Never   Smokeless tobacco: Never  Vaping Use   Vaping status: Never Used  Substance Use Topics   Alcohol use: Yes    Comment: occasional, 1-2 monthly   Drug use: No    ROS: Negative for fevers, chills. Positive for headaches, blurred vision. All other systems reviewed and negative unless stated otherwise in HPI.   Physical Exam:   Vital Signs: There were no vitals taken for this visit. GENERAL: well appearing,in no acute distress,alert SKIN:  Color, texture, turgor normal. No rashes or lesions HEAD:  Normocephalic/atraumatic. CV:  RRR RESP: Normal respiratory effort MSK: +tenderness to palpation over occiput, neck,  and shoulders  NEUROLOGICAL: Mental Status: Alert, oriented to person, place and time,Follows commands Cranial Nerves: PERRL, optic discs sharp, visual fields intact to confrontation, extraocular movements intact, facial sensation intact, no facial droop or ptosis, hearing grossly intact, no dysarthria Motor: muscle strength 5/5 both upper and lower extremities,no drift, normal tone Reflexes: 2+ throughout Sensation: intact to light touch all 4 extremities Coordination: Finger-to- nose-finger intact bilaterally Gait: normal-based, able to heel toe and tandem walk without great difficulty, romberg with some swaying but does not fall  or lose footing   IMPRESSION: 53 year old female with a history of nephrolithiasis, depression, PCOS, migraines who presents for follow up re: worsening headaches. Was seen by Dr. Delena Bali on 01/04/2022. MR brain unremarkable.  She was referred to ophthalmology for blurred vision concerns.  Felt headaches consistent with migraines with additional component of medication overuse headache.  Was started on propranolol for prevention and Nurtec for rescue.  No significant improvement on propranolol after about 1 month therefore started on Emgality monthly injection.  Emgality significantly improved migraine headaches currently about 2/month, no benefit with Nurtec. Also c/o vertigo type symptoms over the past several months, neuro exam intact.    PLAN: -Prevention: Continue Emgality monthly injection -Rescue: Start Ubrelvy PRN, samples provided, will call if beneficial  -Counseled on limiting OTCs to avoid medication overuse headache -next steps: consider CGRP, Botox, qulipta for prevention  -unclear etiology for vertigo symptoms, possibly multifactorial, recent MRI unremarkable, do not believe repeat MRI needed at this point as recent MRI normal and neuro exam intact. Plans on further discussing symptoms with rheumatology at follow-up visit.  Discussed participating in vestibular rehab but wishes to further speak with rheumatology first. Will discuss further with Dr. Delena Bali to see if any additional testing from our standpoint needs to be completed   Follow up in 6 months or call earlier if needed   I spent 31 minutes of face-to-face and non-face-to-face time with patient.  This included previsit chart review, lab review, study review, order entry, electronic health record documentation, patient education and discussion regarding the above and answered all the questions to patient's satisfaction  Ihor Austin, [facility]  [facility] [address] [place], [place]  [address]  Phone [phone] Fax [phone] Note: This document was prepared with digital dictation and possible smart phrase technology. Any transcriptional errors that result from this process are unintentional.

## 2022-12-29 ENCOUNTER — Encounter (HOSPITAL_BASED_OUTPATIENT_CLINIC_OR_DEPARTMENT_OTHER): Payer: Self-pay | Admitting: Emergency Medicine

## 2022-12-29 ENCOUNTER — Emergency Department (HOSPITAL_COMMUNITY): Payer: 59

## 2022-12-29 ENCOUNTER — Encounter (HOSPITAL_COMMUNITY)
Admission: EM | Disposition: A | Discharge status: Home / Self Care | Payer: Self-pay | Source: Home / Self Care | Attending: Emergency Medicine

## 2022-12-29 ENCOUNTER — Other Ambulatory Visit: Payer: Self-pay

## 2022-12-29 ENCOUNTER — Emergency Department (HOSPITAL_BASED_OUTPATIENT_CLINIC_OR_DEPARTMENT_OTHER): Payer: 59 | Admitting: Certified Registered Nurse Anesthetist

## 2022-12-29 ENCOUNTER — Emergency Department (HOSPITAL_COMMUNITY): Payer: 59 | Admitting: Certified Registered Nurse Anesthetist

## 2022-12-29 ENCOUNTER — Observation Stay (HOSPITAL_BASED_OUTPATIENT_CLINIC_OR_DEPARTMENT_OTHER)
Admission: EM | Admit: 2022-12-29 | Discharge: 2022-12-30 | Disposition: A | Discharge status: Home / Self Care | Payer: 59 | Attending: Urology | Admitting: Urology

## 2022-12-29 ENCOUNTER — Emergency Department (HOSPITAL_BASED_OUTPATIENT_CLINIC_OR_DEPARTMENT_OTHER): Payer: 59

## 2022-12-29 DIAGNOSIS — N132 Hydronephrosis with renal and ureteral calculous obstruction: Secondary | ICD-10-CM | POA: Diagnosis not present

## 2022-12-29 DIAGNOSIS — N200 Calculus of kidney: Secondary | ICD-10-CM

## 2022-12-29 DIAGNOSIS — R109 Unspecified abdominal pain: Secondary | ICD-10-CM | POA: Diagnosis present

## 2022-12-29 DIAGNOSIS — J45909 Unspecified asthma, uncomplicated: Secondary | ICD-10-CM | POA: Insufficient documentation

## 2022-12-29 DIAGNOSIS — N390 Urinary tract infection, site not specified: Secondary | ICD-10-CM | POA: Insufficient documentation

## 2022-12-29 DIAGNOSIS — Z79899 Other long term (current) drug therapy: Secondary | ICD-10-CM | POA: Insufficient documentation

## 2022-12-29 DIAGNOSIS — N202 Calculus of kidney with calculus of ureter: Secondary | ICD-10-CM

## 2022-12-29 HISTORY — PX: CYSTOSCOPY W/ URETERAL STENT PLACEMENT: SHX1429

## 2022-12-29 LAB — CBC WITH DIFFERENTIAL/PLATELET
Abs Immature Granulocytes: 0.03 10*3/uL (ref 0.00–0.07)
Basophils Absolute: 0 10*3/uL (ref 0.0–0.1)
Basophils Relative: 0 %
Eosinophils Absolute: 0.1 10*3/uL (ref 0.0–0.5)
Eosinophils Relative: 1 %
HCT: 39.3 % (ref 36.0–46.0)
Hemoglobin: 13.4 g/dL (ref 12.0–15.0)
Immature Granulocytes: 0 %
Lymphocytes Relative: 10 %
Lymphs Abs: 1.1 10*3/uL (ref 0.7–4.0)
MCH: 27.9 pg (ref 26.0–34.0)
MCHC: 34.1 g/dL (ref 30.0–36.0)
MCV: 81.7 fL (ref 80.0–100.0)
Monocytes Absolute: 1 10*3/uL (ref 0.1–1.0)
Monocytes Relative: 9 %
Neutro Abs: 8.9 10*3/uL — ABNORMAL HIGH (ref 1.7–7.7)
Neutrophils Relative %: 80 %
Platelets: 237 10*3/uL (ref 150–400)
RBC: 4.81 MIL/uL (ref 3.87–5.11)
RDW: 12.7 % (ref 11.5–15.5)
WBC: 11.1 10*3/uL — ABNORMAL HIGH (ref 4.0–10.5)
nRBC: 0 % (ref 0.0–0.2)

## 2022-12-29 LAB — URINALYSIS, ROUTINE W REFLEX MICROSCOPIC
Bilirubin Urine: NEGATIVE
Glucose, UA: NEGATIVE mg/dL
Ketones, ur: 15 mg/dL — AB
Nitrite: NEGATIVE
Specific Gravity, Urine: 1.036 — ABNORMAL HIGH (ref 1.005–1.030)
pH: 5.5 (ref 5.0–8.0)

## 2022-12-29 LAB — BASIC METABOLIC PANEL
Anion gap: 13 (ref 5–15)
BUN: 14 mg/dL (ref 6–20)
CO2: 21 mmol/L — ABNORMAL LOW (ref 22–32)
Calcium: 9.2 mg/dL (ref 8.9–10.3)
Chloride: 106 mmol/L (ref 98–111)
Creatinine, Ser: 1.28 mg/dL — ABNORMAL HIGH (ref 0.44–1.00)
GFR, Estimated: 50 mL/min — ABNORMAL LOW (ref >60)
Glucose, Bld: 128 mg/dL — ABNORMAL HIGH (ref 70–99)
Potassium: 3.7 mmol/L (ref 3.5–5.1)
Sodium: 140 mmol/L (ref 135–145)

## 2022-12-29 LAB — LACTIC ACID, PLASMA
Lactic Acid, Venous: 1.6 mmol/L (ref 0.5–1.9)
Lactic Acid, Venous: 1.8 mmol/L (ref 0.5–1.9)

## 2022-12-29 LAB — HCG, QUANTITATIVE, PREGNANCY: hCG, Beta Chain, Quant, S: <1 m[IU]/mL (ref <5)

## 2022-12-29 SURGERY — CYSTOSCOPY, WITH RETROGRADE PYELOGRAM AND URETERAL STENT INSERTION
Anesthesia: General | Laterality: Left

## 2022-12-29 MED ORDER — MORPHINE SULFATE (PF) 4 MG/ML IV SOLN
4.0000 mg | Freq: Once | INTRAVENOUS | Status: AC
  Administered 2022-12-29: 4 mg via INTRAVENOUS
  Filled 2022-12-29: qty 1

## 2022-12-29 MED ORDER — FENTANYL CITRATE (PF) 100 MCG/2ML IJ SOLN
INTRAMUSCULAR | Status: AC
  Filled 2022-12-29: qty 2

## 2022-12-29 MED ORDER — HYDROXYCHLOROQUINE SULFATE 200 MG PO TABS
200.0000 mg | ORAL_TABLET | ORAL | Status: DC
  Administered 2022-12-30: 200 mg via ORAL
  Filled 2022-12-29 (×2): qty 1

## 2022-12-29 MED ORDER — ONDANSETRON HCL 4 MG PO TABS: 4.0000 mg | ORAL_TABLET | Freq: Four times a day (QID) | ORAL | Status: DC | PRN

## 2022-12-29 MED ORDER — LIDOCAINE HCL (PF) 2 % IJ SOLN
INTRAMUSCULAR | Status: AC
  Filled 2022-12-29: qty 5

## 2022-12-29 MED ORDER — VENLAFAXINE HCL 37.5 MG PO TABS
37.5000 mg | ORAL_TABLET | Freq: Two times a day (BID) | ORAL | Status: DC
  Administered 2022-12-29 – 2022-12-30 (×2): 37.5 mg via ORAL
  Filled 2022-12-29 (×2): qty 1

## 2022-12-29 MED ORDER — ONDANSETRON HCL 4 MG/2ML IJ SOLN
4.0000 mg | Freq: Once | INTRAMUSCULAR | Status: AC
  Administered 2022-12-29: 4 mg via INTRAVENOUS
  Filled 2022-12-29: qty 2

## 2022-12-29 MED ORDER — TAMSULOSIN HCL 0.4 MG PO CAPS: 0.4000 mg | ORAL_CAPSULE | Freq: Every day | ORAL | 0 refills | Status: DC

## 2022-12-29 MED ORDER — LACTATED RINGERS IV SOLN
INTRAVENOUS | Status: DC | PRN
Start: 2022-12-29 — End: 2022-12-29

## 2022-12-29 MED ORDER — HYDROMORPHONE HCL 1 MG/ML IJ SOLN
0.5000 mg | Freq: Once | INTRAMUSCULAR | Status: AC
  Administered 2022-12-29: 0.5 mg via INTRAVENOUS
  Filled 2022-12-29: qty 1

## 2022-12-29 MED ORDER — HYDROCODONE-ACETAMINOPHEN 5-325 MG PO TABS
1.0000 | ORAL_TABLET | ORAL | Status: DC | PRN
  Administered 2022-12-29 – 2022-12-30 (×4): 2 via ORAL
  Filled 2022-12-29 (×4): qty 2

## 2022-12-29 MED ORDER — HYDROMORPHONE HCL 1 MG/ML IJ SOLN
1.0000 mg | INTRAMUSCULAR | Status: DC | PRN
  Administered 2022-12-29 – 2022-12-30 (×4): 1 mg via INTRAVENOUS
  Filled 2022-12-29 (×4): qty 1

## 2022-12-29 MED ORDER — ONDANSETRON HCL 4 MG/2ML IJ SOLN: 4.0000 mg | Freq: Once | INTRAMUSCULAR | Status: DC | PRN

## 2022-12-29 MED ORDER — ACETAMINOPHEN 325 MG PO TABS: 650.0000 mg | ORAL_TABLET | ORAL | Status: DC | PRN

## 2022-12-29 MED ORDER — ONDANSETRON HCL 4 MG/2ML IJ SOLN
4.0000 mg | Freq: Four times a day (QID) | INTRAMUSCULAR | Status: DC | PRN
  Administered 2022-12-29 – 2022-12-30 (×2): 4 mg via INTRAVENOUS
  Filled 2022-12-29 (×2): qty 2

## 2022-12-29 MED ORDER — PNEUMOCOCCAL 20-VAL CONJ VACC 0.5 ML IM SUSY
0.5000 mL | PREFILLED_SYRINGE | INTRAMUSCULAR | Status: DC
  Filled 2022-12-29: qty 0.5

## 2022-12-29 MED ORDER — IOHEXOL 300 MG/ML  SOLN
INTRAMUSCULAR | Status: DC | PRN
  Administered 2022-12-29: 3 mL

## 2022-12-29 MED ORDER — FENTANYL CITRATE PF 50 MCG/ML IJ SOSY
25.0000 ug | PREFILLED_SYRINGE | INTRAMUSCULAR | Status: DC | PRN
  Administered 2022-12-29 (×2): 50 ug via INTRAVENOUS

## 2022-12-29 MED ORDER — OXYCODONE HCL 5 MG PO TABS: 5.0000 mg | ORAL_TABLET | ORAL | Status: DC | PRN

## 2022-12-29 MED ORDER — STERILE WATER FOR IRRIGATION IR SOLN
Status: DC | PRN
  Administered 2022-12-29: 3000 mL

## 2022-12-29 MED ORDER — ONDANSETRON HCL 4 MG/2ML IJ SOLN
INTRAMUSCULAR | Status: AC
  Filled 2022-12-29: qty 2

## 2022-12-29 MED ORDER — ACETAMINOPHEN 500 MG PO TABS
1000.0000 mg | ORAL_TABLET | Freq: Once | ORAL | Status: AC
  Administered 2022-12-29: 1000 mg via ORAL
  Filled 2022-12-29: qty 2

## 2022-12-29 MED ORDER — ENOXAPARIN SODIUM 40 MG/0.4ML IJ SOSY
40.0000 mg | PREFILLED_SYRINGE | INTRAMUSCULAR | Status: DC
  Administered 2022-12-29: 40 mg via SUBCUTANEOUS
  Filled 2022-12-29: qty 0.4

## 2022-12-29 MED ORDER — ONDANSETRON 4 MG PO TBDP: 4.0000 mg | ORAL_TABLET | Freq: Three times a day (TID) | ORAL | 0 refills | Status: DC | PRN

## 2022-12-29 MED ORDER — FENTANYL CITRATE PF 50 MCG/ML IJ SOSY
PREFILLED_SYRINGE | INTRAMUSCULAR | Status: AC
  Filled 2022-12-29: qty 1

## 2022-12-29 MED ORDER — TRAZODONE HCL 100 MG PO TABS
200.0000 mg | ORAL_TABLET | Freq: Every day | ORAL | Status: DC
  Administered 2022-12-29: 200 mg via ORAL
  Filled 2022-12-29: qty 2

## 2022-12-29 MED ORDER — PROPOFOL 10 MG/ML IV BOLUS
INTRAVENOUS | Status: AC
  Filled 2022-12-29: qty 20

## 2022-12-29 MED ORDER — CHLORHEXIDINE GLUCONATE 0.12 % MT SOLN
15.0000 mL | Freq: Once | OROMUCOSAL | Status: AC
  Administered 2022-12-29: 15 mL via OROMUCOSAL

## 2022-12-29 MED ORDER — AMISULPRIDE (ANTIEMETIC) 5 MG/2ML IV SOLN: 10.0000 mg | Freq: Once | INTRAVENOUS | Status: DC | PRN

## 2022-12-29 MED ORDER — OXYCODONE-ACETAMINOPHEN 5-325 MG PO TABS: 1.0000 | ORAL_TABLET | Freq: Four times a day (QID) | ORAL | 0 refills | Status: DC | PRN

## 2022-12-29 MED ORDER — ALBUTEROL SULFATE (2.5 MG/3ML) 0.083% IN NEBU: 2.5000 mg | INHALATION_SOLUTION | RESPIRATORY_TRACT | Status: DC | PRN

## 2022-12-29 MED ORDER — KETOROLAC TROMETHAMINE 15 MG/ML IJ SOLN
15.0000 mg | Freq: Once | INTRAMUSCULAR | Status: AC
  Administered 2022-12-29: 15 mg via INTRAVENOUS
  Filled 2022-12-29: qty 1

## 2022-12-29 MED ORDER — SODIUM CHLORIDE 0.9 % IV SOLN
1.0000 g | Freq: Once | INTRAVENOUS | Status: AC
  Administered 2022-12-29: 1 g via INTRAVENOUS
  Filled 2022-12-29: qty 10

## 2022-12-29 MED ORDER — LACTATED RINGERS IV SOLN: INTRAVENOUS | Status: DC

## 2022-12-29 MED ORDER — SPIRONOLACTONE 25 MG PO TABS
100.0000 mg | ORAL_TABLET | Freq: Every day | ORAL | Status: DC
  Administered 2022-12-30: 100 mg via ORAL
  Filled 2022-12-29: qty 4

## 2022-12-29 MED ORDER — OXYCODONE-ACETAMINOPHEN 5-325 MG PO TABS
2.0000 | ORAL_TABLET | Freq: Once | ORAL | Status: AC
  Administered 2022-12-29: 2 via ORAL
  Filled 2022-12-29: qty 2

## 2022-12-29 MED ORDER — HYDROXYCHLOROQUINE SULFATE 200 MG PO TABS: 200.0000 mg | ORAL_TABLET | Freq: Two times a day (BID) | ORAL | Status: DC

## 2022-12-29 MED ORDER — SODIUM CHLORIDE 0.9 % IV SOLN
2.0000 g | INTRAVENOUS | Status: DC
  Administered 2022-12-30: 2 g via INTRAVENOUS
  Filled 2022-12-29: qty 20

## 2022-12-29 MED ORDER — SODIUM CHLORIDE 0.9 % IV BOLUS
1000.0000 mL | Freq: Once | INTRAVENOUS | Status: AC
  Administered 2022-12-29: 1000 mL via INTRAVENOUS

## 2022-12-29 MED ORDER — ACETAMINOPHEN 325 MG PO TABS: 650.0000 mg | ORAL_TABLET | Freq: Four times a day (QID) | ORAL | Status: DC | PRN

## 2022-12-29 MED ORDER — PROPOFOL 10 MG/ML IV BOLUS
INTRAVENOUS | Status: DC | PRN
Start: 2022-12-29 — End: 2022-12-29
  Administered 2022-12-29: 170 mg via INTRAVENOUS

## 2022-12-29 MED ORDER — DEXAMETHASONE SODIUM PHOSPHATE 10 MG/ML IJ SOLN
INTRAMUSCULAR | Status: AC
  Filled 2022-12-29: qty 1

## 2022-12-29 MED ORDER — MORPHINE SULFATE (PF) 2 MG/ML IV SOLN: 2.0000 mg | INTRAVENOUS | Status: DC | PRN

## 2022-12-29 MED ORDER — FENTANYL CITRATE (PF) 100 MCG/2ML IJ SOLN
INTRAMUSCULAR | Status: DC | PRN
  Administered 2022-12-29: 25 ug via INTRAVENOUS
  Administered 2022-12-29: 50 ug via INTRAVENOUS
  Administered 2022-12-29: 25 ug via INTRAVENOUS

## 2022-12-29 MED ORDER — ACETAMINOPHEN 650 MG RE SUPP: 650.0000 mg | Freq: Four times a day (QID) | RECTAL | Status: DC | PRN

## 2022-12-29 MED ORDER — LIDOCAINE HCL (CARDIAC) PF 100 MG/5ML IV SOSY
PREFILLED_SYRINGE | INTRAVENOUS | Status: DC | PRN
  Administered 2022-12-29: 80 mg via INTRAVENOUS

## 2022-12-29 MED ORDER — ONDANSETRON HCL 4 MG/2ML IJ SOLN
INTRAMUSCULAR | Status: DC | PRN
  Administered 2022-12-29: 4 mg via INTRAVENOUS

## 2022-12-29 SURGICAL SUPPLY — 11 items
BAG URO CATCHER STRL LF (MISCELLANEOUS) ×1 IMPLANT
CATH URETL OPEN END 6FR 70 (CATHETERS) ×1 IMPLANT
CLOTH BEACON ORANGE TIMEOUT ST (SAFETY) ×1 IMPLANT
GLOVE SURG LX STRL 7.5 STRW (GLOVE) ×1 IMPLANT
GOWN STRL REUS W/ TWL XL LVL3 (GOWN DISPOSABLE) ×2 IMPLANT
GOWN STRL REUS W/TWL XL LVL3 (GOWN DISPOSABLE) ×2
GUIDEWIRE STR DUAL SENSOR (WIRE) ×1 IMPLANT
MANIFOLD NEPTUNE II (INSTRUMENTS) ×1 IMPLANT
PACK CYSTO (CUSTOM PROCEDURE TRAY) ×1 IMPLANT
STENT URET 6FRX26 CONTOUR (STENTS) IMPLANT
TUBING CONNECTING 10 (TUBING) IMPLANT

## 2022-12-29 NOTE — ED Notes (Signed)
[facility] at [facility] will send transport for [facility] ED to [facility] ED. Dr Pricilla Loveless is accepting.-ABB(NS)

## 2022-12-29 NOTE — Op Note (Signed)
Preoperative diagnosis: Left ureteral stone with urinary tract infection Postoperative diagnosis: Left ureteral stone and urinary tract infection Surgery: Cystoscopy, left retrograde ureterogram and insertion of left ureteral stent Surgeon: Dr. Lorin Picket   The patient has the above diagnosis and consent for the above procedure.  Preoperative allergies were discussed.  She is being treated as per sepsis protocol and had received antibiotics  Extra care was taken with leg positioning.  24 French cystoscope was utilized.  Bladder mucosa and trigone were normal.  No stone in the bladder.  No obvious cystitis.  Under fluoroscopic guidance I passed a sensor wire to the upper third of the left ureter.  I then passed a 6 [place] open-ended ureteral catheter to that height.  I want to minimize the amount of injection utilized.  I injected 3 cc of contrast under low pressure with no extravasation.  She had a small renal pelvis and mild dilation of the upper pole calyx.  The small renal pelvis and calyces was impressive and extra care was taken throughout the rest of the procedure not to lose access and make certain I was proximal to the stone not identified during this procedure  I passed a sensor wire and a curled in the upper pole calyx.  Again extra care was used to keep it there.  I passed a 6 [place] by 26 cm double-J stent with no string with the described technique currently in the upper pole calyx and curling in the bladder.  She did have a mild volcano effect.  X-rays were taken.  Eyes very happy the position of the stent.  I decided not to insert a Foley catheter

## 2022-12-29 NOTE — Anesthesia Preprocedure Evaluation (Signed)
Anesthesia Evaluation  Patient identified by MRN, date of birth, ID band Patient awake    Reviewed: Allergy & Precautions, NPO status , Patient's Chart, lab work & pertinent test results  Airway Mallampati: II  TM Distance: >3 FB Neck ROM: Full    Dental  (+) Teeth Intact, Dental Advisory Given   Pulmonary asthma    Pulmonary exam normal breath sounds clear to auscultation       Cardiovascular negative cardio ROS Normal cardiovascular exam Rhythm:Regular Rate:Normal     Neuro/Psych  Headaches PSYCHIATRIC DISORDERS Anxiety Depression       GI/Hepatic negative GI ROS, Neg liver ROS,,,  Endo/Other  negative endocrine ROS    Renal/GU Renal disease (left ureteral stone, sepsis)     Musculoskeletal negative musculoskeletal ROS (+)    Abdominal   Peds  (+) ATTENTION DEFICIT DISORDER WITHOUT HYPERACTIVITY Hematology negative hematology ROS (+)   Anesthesia Other Findings Day of surgery medications reviewed with the patient.  Reproductive/Obstetrics                             Anesthesia Physical Anesthesia Plan  ASA: 2  Anesthesia Plan: General   Post-op Pain Management: Tylenol PO (pre-op)*   Induction: Intravenous  PONV Risk Score and Plan: 3 and Midazolam, Ondansetron and Dexamethasone  Airway Management Planned: LMA  Additional Equipment:   Intra-op Plan:   Post-operative Plan: Extubation in OR  Informed Consent: I have reviewed the patients History and Physical, chart, labs and discussed the procedure including the risks, benefits and alternatives for the proposed anesthesia with the patient or authorized representative who has indicated his/her understanding and acceptance.     Dental advisory given  Plan Discussed with: CRNA  Anesthesia Plan Comments:        Anesthesia Quick Evaluation

## 2022-12-29 NOTE — ED Triage Notes (Addendum)
Pt in with L flank pain x 1 wk, emesis x 2 days. Pain to L flank worsened, now radiates to L groin. Pt reports hx of lupus, PCOS and early CKD. Denies any fevers. Pt took home Oxy at 0200

## 2022-12-29 NOTE — Progress Notes (Signed)
Creatinine is 1.18 serum

## 2022-12-29 NOTE — ED Provider Notes (Signed)
  Physical Exam  BP 117/79 (BP Location: Left Arm)   Pulse 78   Temp 97.6 F (36.4 C) (Oral)   Resp 14   Ht 5\' 5"  (1.651 m)   Wt 83.9 kg   LMP 12/15/2022 (Exact Date) Comment: negative beta HCG 12/29/22  SpO2 100%   BMI 30.79 kg/m   Physical Exam  Procedures  Procedures  ED Course / MDM   Clinical Course as of 12/29/22 1602  Wed Dec 29, 2022  0716 Assumed care from Dr. Wilkie Aye.  52 year old female who presented to the emergency department with flank pain.  Was found to have a 4 mm left UPJ stone with hydronephrosis.  No significant AKI on labs.  Not septic appearing.  Has been given multiple rounds of pain and nausea medication.  Awaiting urinalysis at this time and pain control. [RP]  0819 Patient's urinalysis is returned and is consistent with UTI she was started on ceftriaxone.  Patient has become tachycardic in the emergency department has received 2 L of fluids.  Did have an episode of soft blood pressure.  Will reach out to urology about the patient's possible infected kidney stone.  Given her change in vital signs will also admitted to medicine to observe her to ensure that she is not becoming septic.  Cultures and lactate were sent for the patient at this time.  Cam Sattenfield from urology consulted and will discuss with the attending physician on call. [RP]  [phone] Dr Sherron Monday consulted from urology.  Patient will go to short stay preop area prior to receiving a ureteral stent. [RP]  [phone] Dr Erenest Blank from hospitalist will admit the patient. [RP]    Clinical Course User Index [RP] Rondel Baton, MD   Medical Decision Making Amount and/or Complexity of Data Reviewed Labs: ordered. Radiology: ordered.  Risk Prescription drug management. Decision regarding hospitalization.      Rondel Baton, MD 12/29/22 [phone]

## 2022-12-29 NOTE — ED Provider Notes (Signed)
[place] EMERGENCY DEPARTMENT AT [facility] Provider Note   CSN: [id number] Arrival date & time: 12/29/22  0346     History {Add pertinent medical, surgical, social history, OB history to HPI:1} Chief Complaint  Patient presents with   Flank Pain   Emesis    Jordan Mayer is a 52 y.o. female.  HPI     This is a 52 year old female who presents with left flank pain.  Patient reports over the last 3 days she has had nausea and vomiting; however, last night she developed left flank pain.  That pain now radiates into the left groin.  She does have history of kidney stones.  She did take pain medication at home, oxycodone, which minimally helped the pain.  She reports nonbilious, nonbloody emesis.  Has not noted any gross blood in her urine or dysuria.  Home Medications Prior to Admission medications   Medication Sig Start Date End Date Taking? Authorizing Provider  albuterol (VENTOLIN HFA) 108 (90 Base) MCG/ACT inhaler Inhale into the lungs as needed. Patient not taking: Reported on 08/18/2022 01/28/20   [provider]  belimumab 10 mg/kg in sodium chloride 0.9 % Inject 10 mg/kg into the vein once.    [provider]  Cholecalciferol (D3 PO) Take by mouth. 2000 mg daily    [provider]  clotrimazole (LOTRIMIN) 1 % cream Apply 1 Application topically 2 (two) times daily. 08/23/22   Gearldine Bienenstock, PA-C  Cyanocobalamin 2000 MCG/ML SOLN Inject as directed. 1 mL into skin once weekly x4, then once monthly    [provider]  Dapsone 7.5 % GEL SMARTSIG:Sparingly Topical Every Night 07/12/22   [provider]  EPINEPHrine 0.3 mg/0.3 mL IJ SOAJ injection 0.3 mL. Patient not taking: Reported on 08/25/2022 12/27/17   [provider]  EVEKEO 10 MG TABS Take by mouth. Take 2 tablets in the morning and 1 in the afternoon 06/15/20   [provider]  fluconazole (DIFLUCAN) 150 MG tablet Take 1 tablet by mouth every other day  for a total of 3 doses. 08/23/22   Gearldine Bienenstock, PA-C  fluticasone (FLONASE) 50 MCG/ACT nasal spray Place 1 spray into both nostrils daily.    [provider]  Galcanezumab-gnlm (EMGALITY) 120 MG/ML SOAJ ADMINISTER 1 ML UNDER THE SKIN EVERY 30 DAYS 09/06/22   Ocie Doyne, MD  [name] 24 FE 1-20 MG-MCG(24) tablet Take 1 tablet by mouth daily. 04/27/20   [provider]  hydroxychloroquine (PLAQUENIL) 200 MG tablet Take 200mg  by mouth twice daily, Monday through Friday only. None on Saturday or Sunday. 08/18/22   Gearldine Bienenstock, PA-C  Multiple Vitamin (MULTIVITAMIN) tablet Take 1 tablet by mouth daily.    [provider]  ondansetron (ZOFRAN-ODT) 8 MG disintegrating tablet Take 1 tablet (8 mg total) by mouth every 8 (eight) hours as needed for nausea or vomiting. 08/18/22   Gearldine Bienenstock, PA-C  predniSONE (DELTASONE) 5 MG tablet Take 4 tabs po x 4 days, 3  tabs po x 4 days, 2  tabs po x 4 days, 1  tab po x 4 days Patient not taking: Reported on 08/18/2022 07/15/22   Gearldine Bienenstock, PA-C  spironolactone (ALDACTONE) 100 MG tablet Take 100 mg by mouth daily.    [provider]  tirzepatide (ZEPBOUND) 2.5 MG/0.5ML Pen Inject 2.5 mg into the skin once a week. 09/14/22   Panosh, Neta Mends, MD  traZODone (DESYREL) 100 MG tablet Take 200 mg by mouth  at bedtime.    [provider]  UBRELVY 100 MG TABS Take by mouth. 08/03/22   [provider]  venlafaxine (EFFEXOR) 37.5 MG tablet Take 37.5 mg by mouth 2 (two) times daily.    [provider]      Allergies    Contrast media [iodinated contrast media], Iohexol, Penicillins, and Gadolinium derivatives    Review of Systems   Review of Systems  Constitutional:  Negative for fever.  Gastrointestinal:  Positive for nausea and vomiting.  Genitourinary:  Positive for flank pain. Negative for dysuria and hematuria.  All other systems reviewed and are negative.   Physical Exam Updated Vital Signs BP  136/83   Pulse 77   Temp 98.4 F (36.9 C) (Oral)   Resp (!) 22   Wt 81.6 kg   LMP 12/15/2022 (Exact Date)   SpO2 95%   BMI 29.95 kg/m  Physical Exam Vitals and nursing note reviewed.  Constitutional:      Appearance: She is well-developed. She is obese.     Comments: Uncomfortable appearing but nontoxic  HENT:     Head: Normocephalic and atraumatic.  Eyes:     Pupils: Pupils are equal, round, and reactive to light.  Cardiovascular:     Rate and Rhythm: Normal rate and regular rhythm.     Heart sounds: Normal heart sounds.  Pulmonary:     Effort: Pulmonary effort is normal. No respiratory distress.     Breath sounds: No wheezing.  Abdominal:     General: Bowel sounds are normal.     Palpations: Abdomen is soft.     Tenderness: There is left CVA tenderness. There is no right CVA tenderness.  Musculoskeletal:     Cervical back: Neck supple.  Skin:    General: Skin is warm and dry.  Neurological:     Mental Status: She is alert and oriented to person, place, and time.  Psychiatric:        Mood and Affect: Mood normal.     ED Results / Procedures / Treatments   Labs (all labs ordered are listed, but only abnormal results are displayed) Labs Reviewed  CBC WITH DIFFERENTIAL/PLATELET - Abnormal; Notable for the following components:      Result Value   WBC 11.1 (*)    Neutro Abs 8.9 (*)    All other components within normal limits  BASIC METABOLIC PANEL - Abnormal; Notable for the following components:   CO2 21 (*)    Glucose, Bld 128 (*)    Creatinine, Ser 1.28 (*)    GFR, Estimated 50 (*)    All other components within normal limits  HCG, QUANTITATIVE, PREGNANCY  URINALYSIS, ROUTINE W REFLEX MICROSCOPIC    EKG None  Radiology CT Renal Stone Study  Result Date: 12/29/2022 CLINICAL DATA:  Abdominal/flank pain with stone suspected. Left flank pain for 1 week EXAM: CT ABDOMEN AND PELVIS WITHOUT CONTRAST TECHNIQUE: Multidetector CT imaging of the abdomen and  pelvis was performed following the standard protocol without IV contrast. RADIATION DOSE REDUCTION: This exam was performed according to the departmental dose-optimization program which includes automated exposure control, adjustment of the mA and/or kV according to patient size and/or use of iterative reconstruction technique. COMPARISON:  06/02/2021 FINDINGS: Lower chest:  No contributory findings. Hepatobiliary: No focal liver abnormality.Cholecystectomy. Pancreas: Unremarkable. Spleen: Unremarkable. Adrenals/Urinary Tract: Negative adrenals. 4 mm left UPJ calculus causing slight hydronephrosis. No additional urolithiasis. Unremarkable bladder. Stomach/Bowel: No obstruction. No visible bowel inflammation. Postoperative stomach. Vascular/Lymphatic: No  acute vascular abnormality. No mass or adenopathy. Reproductive:No pathologic findings. Other: No ascites or pneumoperitoneum. Musculoskeletal: No acute abnormalities. IMPRESSION: 4 mm calculus just below the left UPJ causing minimal hydronephrosis. Electronically Signed   By: Tiburcio Pea M.D.   On: 12/29/2022 05:03    Procedures Procedures  {Document cardiac monitor, telemetry assessment procedure when appropriate:1}  Medications Ordered in ED Medications  morphine (PF) 4 MG/ML injection 4 mg (4 mg Intravenous Given 12/29/22 0440)  ondansetron (ZOFRAN) injection 4 mg (4 mg Intravenous Given 12/29/22 0440)  morphine (PF) 4 MG/ML injection 4 mg (4 mg Intravenous Given 12/29/22 0513)  ketorolac (TORADOL) 15 MG/ML injection 15 mg (15 mg Intravenous Given 12/29/22 0511)  oxyCODONE-acetaminophen (PERCOCET/ROXICET) 5-325 MG per tablet 2 tablet (2 tablets Oral Given 12/29/22 0608)  sodium chloride 0.9 % bolus 1,000 mL (1,000 mLs Intravenous New Bag/Given 12/29/22 0603)    ED Course/ Medical Decision Making/ A&P   {   Click here for ABCD2, HEART and other calculatorsREFRESH Note before signing :1}                              Medical Decision  Making Amount and/or Complexity of Data Reviewed Labs: ordered. Radiology: ordered.  Risk Prescription drug management.   ***  {Document critical care time when appropriate:1} {Document review of labs and clinical decision tools ie heart score, Chads2Vasc2 etc:1}  {Document your independent review of radiology images, and any outside records:1} {Document your discussion with family members, caretakers, and with consultants:1} {Document social determinants of health affecting pt's care:1} {Document your decision making why or why not admission, treatments were needed:1} Final Clinical Impression(s) / ED Diagnoses Final diagnoses:  None    Rx / DC Orders ED Discharge Orders     None

## 2022-12-29 NOTE — Anesthesia Postprocedure Evaluation (Signed)
Anesthesia Post Note  Patient: Jordan Mayer  Procedure(s) Performed: CYSTOSCOPY WITH LEFT RETROGRADE PYELOGRAM/URETERAL STENT PLACEMENT (Left)     Patient location during evaluation: PACU Anesthesia Type: General Level of consciousness: awake and alert Pain management: pain level controlled Vital Signs Assessment: post-procedure vital signs reviewed and stable Respiratory status: spontaneous breathing, nonlabored ventilation, respiratory function stable and patient connected to nasal cannula oxygen Cardiovascular status: blood pressure returned to baseline and stable Postop Assessment: no apparent nausea or vomiting Anesthetic complications: no   No notable events documented.  Last Vitals:  Vitals:   12/29/22 1457 12/29/22 1852  BP: 117/79 112/73  Pulse: 78 80  Resp: 14   Temp: 36.4 C 36.9 C  SpO2: 100% 97%    Last Pain:  Vitals:   12/29/22 1852  TempSrc: Oral  PainSc:                  Collene Schlichter

## 2022-12-29 NOTE — Discharge Instructions (Addendum)
You were seen today for flank pain.  You have a 4 mm kidney stone.  This will likely pass on its own.  Make sure that you are staying hydrated.  Use pain and nausea medication as prescribed.  If worsening pain, fevers, or continued vomiting, you should follow-up.

## 2022-12-29 NOTE — TOC Initial Note (Signed)
Transition of Care [facility]) - Initial/Assessment Note    Patient Details  Name: Jordan Mayer MRN: [id number] Date of Birth: 1970-08-19  Transition of [facility]) CM/SW Contact:    Lanier Clam, RN Phone Number: 12/29/2022, 3:20 PM  Clinical Narrative: d/c plan home.                  Expected Discharge Plan: Home/Self Care Barriers to Discharge: Continued Medical Work up   Patient Goals and CMS Choice Patient states their goals for this hospitalization and ongoing recovery are:: Home CMS Medicare.gov Compare Post Acute Care list provided to:: Patient Choice offered to / list presented to : Patient [place] ownership interest in [facility].provided to:: Patient    Expected Discharge Plan and Services   Discharge Planning Services: CM Consult   Living arrangements for the past 2 months: Single Family Home                                      Prior Living Arrangements/Services Living arrangements for the past 2 months: Single Family Home Lives with:: Spouse Patient language and need for interpreter reviewed:: Yes Do you feel safe going back to the place where you live?: Yes      Need for Family Participation in Patient Care: Yes (Comment) Care giver support system in place?: Yes (comment)   Criminal Activity/Legal Involvement Pertinent to Current Situation/Hospitalization: No - Comment as needed  Activities of Daily Living Home Assistive Devices/Equipment: None ADL Screening (condition at time of admission) Patient's cognitive ability adequate to safely complete daily activities?: Yes Is the patient deaf or have difficulty hearing?: No Does the patient have difficulty seeing, even when wearing glasses/contacts?: No Does the patient have difficulty concentrating, remembering, or making decisions?: No Patient able to express need for assistance with ADLs?: Yes Does the patient have difficulty dressing or bathing?: No Independently performs ADLs?: Yes  (appropriate for developmental age) Does the patient have difficulty walking or climbing stairs?: No Weakness of Legs: Both Weakness of Arms/Hands: Both  Permission Sought/Granted Permission sought to share information with : Case Manager Permission granted to share information with : Yes, Verbal Permission Granted  Share Information with NAME: Case Manager           Emotional Assessment Appearance:: Appears stated age Attitude/Demeanor/Rapport: Gracious Affect (typically observed): Accepting Orientation: : Oriented to Self, Oriented to Place, Oriented to  Time, Oriented to Situation Alcohol / Substance Use: Not Applicable Psych Involvement: No (comment)  Admission diagnosis:  Kidney stone [N20.0] Nephrolithiasis [N20.0] Ureteral stone with hydronephrosis [N13.2] Patient Active Problem List   Diagnosis Date Noted   Nephrolithiasis 12/29/2022   Ureteral stone with hydronephrosis 12/29/2022   Positive ANA (antinuclear antibody) 04/12/2022   Genetic testing 07/21/2021   Family history of melanoma 07/13/2021   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   Vertigo 12/23/2011   Otalgia of both ears 09/17/2010   SLEEP DISORDER/DISTURBANCE 09/27/2008   FATIGUE 07/15/2008   ADHD 12/05/2007   HEADACHE 11/26/2007   ALLERGIC RHINITIS 10/13/2007   NEPHROLITHIASIS, HX OF 10/13/2007   NONSPECIFIC MESENTERIC LYMPHADENITIS 03/17/2007   RENAL CALCULUS, LEFT 03/17/2007   DEPRESSION 03/07/2007   DYSPNEA ON EXERTION 03/07/2007   PCP:  Madelin Headings, MD Pharmacy:   [facility] [phone] - [place], [place] - [address] AT  [facility] OF [address] [place] [address] Phone: [phone] Fax: [phone]     Social Determinants of Health (SDOH) Social History: SDOH Screenings   Food Insecurity: No Food Insecurity (12/29/2022)  Housing: Low  Risk  (12/29/2022)  Transportation Needs: No Transportation Needs (12/29/2022)  Utilities: Not At Risk (12/29/2022)  Depression (PHQ2-9): Medium Risk (11/05/2021)  Social Connections: Unknown (01/22/2022)   Received from [facility]  Tobacco Use: Low Risk  (12/29/2022)   SDOH Interventions:     Readmission Risk Interventions     No data to display

## 2022-12-29 NOTE — ED Notes (Signed)
Patient transported to CT 

## 2022-12-29 NOTE — ED Notes (Signed)
Jordan Mayer at [facility] will have Hospitalist  speak with Jordan Mayer. Advised pt going ED to ED at [facility] Jordan Mayer accepting but now can be sent to [facility].-ABB(NS)

## 2022-12-29 NOTE — H&P (Signed)
History and Physical  Jordan Mayer OZD:[id number] DOB: 1970/06/18 DOA: 12/29/2022  PCP: Madelin Headings, MD   Chief Complaint: Flank pain  HPI: Jordan Mayer is a 52 y.o. female with medical history significant for lupus, nephrolithiasis being admitted to the hospital with obstructing left renal stone now status post left retrograde ureterogram and insertion of left ureteral stent.  Patient tells me she has been having some abdominal pain for the last 3 or 4 days, initially thought that she had an upset stomach, but last evening in the middle of the night while she was sleeping she was woken up from sleep with more severe pain, this time in the left flank.  She typically gets a couple of UTIs per year, and knows what that feels like.  However she never felt any dysuria, fevers, chills, nausea, vomiting just left-sided flank pain early this morning.  ED Course: She presented with the symptoms to [facility], and workup is detailed below was consistent with possibility of UTI, and a 4 mm left-sided partially obstructing renal stone with a small amount of hydronephrosis.  While initially vital signs were very stable, she became slightly tachycardic and hypotensive in the emergency department lactate was normal, she was given IV fluid and this was responsive.  No evidence of sepsis.  She was given IV Rocephin empirically.  Due to the some mild hypotension and concerned about possible development of sepsis, ER provider discussed with urology who did recommend hospitalist admission for observation postoperatively.  Patient has now undergone the procedure as noted above, she feels well and has no complaints and is being admitted to the hospitalist service.  Currently her only complaint is some slight left flank pain.  Review of Systems: Please see HPI for pertinent positives and negatives. A complete 10 system review of systems are otherwise negative.  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    Shingles    Past Surgical History:  Procedure Laterality Date   BUNIONECTOMY Right    CHOLECYSTECTOMY     COLONOSCOPY     HIATAL HERNIA REPAIR     with gastric sleeve   LAPAROSCOPIC CHOLECYSTECTOMY SINGLE SITE WITH INTRAOPERATIVE CHOLANGIOGRAM N/A 06/14/2017   Procedure: LAPAROSCOPIC CHOLECYSTECTOMY SINGLE SITE;  Surgeon: Karie Soda, MD;  Location: [facility];  Service: General;  Laterality: N/A;   LAPAROSCOPIC GASTRIC SLEEVE RESECTION  09/2013   LASER ABLATION     Vascular   NASAL SINUS SURGERY  12/2021   TONSILLECTOMY     WISDOM TOOTH EXTRACTION      Social History:  reports that she has never smoked. She has never been exposed to tobacco smoke. She has never used smokeless tobacco. She reports current alcohol use. She reports that she does not use drugs.   Allergies  Allergen Reactions   Contrast Media [Iodinated Contrast Media]    Iohexol      Code: HIVES, Desc: 1 hive developed on hand post injection of 125cc's Omni 300., Onset Date: 25956387    Penicillins Other (See Comments)    Has patient had a  PCN reaction causing immediate rash, facial/tongue/throat swelling, SOB or lightheadedness with hypotension: Yes Has patient had a PCN reaction causing severe rash involving mucus membranes or skin necrosis: No Has patient had a PCN reaction that required hospitalization: No Has patient had a PCN reaction occurring within the last 10 years: No If all of the above answers are "NO", then may proceed with Cephalosporin use.   Gadolinium Derivatives Hives, Itching and Rash    Family History  Problem Relation Age of Onset   Varicose Veins Mother    Asthma Mother    Myasthenia gravis  Mother        Ocular   Varicose Veins Sister    Arthritis Maternal Grandmother    Arthritis Paternal Grandfather    Hearing loss Paternal Grandfather    Healthy Son    Healthy Son    Birth defects Paternal Aunt        Cognitively Challenged   Miscarriages / Stillbirths Paternal Aunt    Varicose Veins Paternal Aunt    Varicose Veins Paternal Aunt    Melanoma Paternal Aunt        melanoma x2   Prostate cancer Cousin    Colon cancer Neg Hx    Colon polyps Neg Hx    Esophageal cancer Neg Hx    Rectal cancer Neg Hx    Stomach cancer Neg Hx      Prior to Admission medications   Medication Sig Start Date End Date Taking? Authorizing Provider  belimumab (BENLYSTA) 400 MG SOLR injection Inject 10 mg/kg into the vein once.   Yes [provider]  Cholecalciferol (D3 PO) Take by mouth. 2000 mg daily   Yes [provider]  Cyanocobalamin 2000 MCG/ML SOLN Inject as directed. 1 mL into skin once weekly x4, then once monthly   Yes [provider]  Dapsone 7.5 % GEL SMARTSIG:Sparingly Topical Every Night 07/12/22  Yes [provider]  EPINEPHrine 0.3 mg/0.3 mL IJ SOAJ injection  12/27/17  Yes [provider]  EVEKEO 10 MG TABS Take by mouth. Take 2 tablets in the morning and 1 in the afternoon 06/15/20  Yes [provider]  Galcanezumab-gnlm (EMGALITY) 120 MG/ML SOAJ ADMINISTER 1 ML UNDER THE SKIN EVERY 30 DAYS 09/06/22  Yes Chima, Victorino Dike, MD  [name] 24 FE 1-20 MG-MCG(24) tablet Take 1 tablet by mouth daily. 04/27/20  Yes [provider]  hydroxychloroquine (PLAQUENIL) 200 MG tablet Take 200mg  by mouth twice daily, Monday through Friday only. None on Saturday or Sunday. 08/18/22  Yes Gearldine Bienenstock, PA-C  Multiple Vitamin (MULTIVITAMIN) tablet Take 1 tablet by mouth daily.   Yes [provider]  ondansetron (ZOFRAN-ODT) 4 MG disintegrating tablet Take 1 tablet (4 mg total) by mouth every 8 (eight) hours as needed for nausea or  vomiting. 12/29/22  Yes Horton, Mayer Masker, MD  ondansetron (ZOFRAN-ODT) 8 MG disintegrating tablet Take 1 tablet (8 mg total) by mouth every 8 (eight) hours as needed for nausea or vomiting. 08/18/22  Yes Gearldine Bienenstock, PA-C  oxyCODONE-acetaminophen (PERCOCET/ROXICET) 5-325 MG tablet Take 1 tablet by mouth every 6 (six) hours as needed for severe pain. 12/29/22  Yes Horton, Mayer Masker, MD  predniSONE (DELTASONE) 5 MG tablet Take 4 tabs po x 4 days, 3  tabs po x 4 days, 2  tabs po x 4 days, 1  tab po x 4 days 07/15/22  Yes Gearldine Bienenstock, PA-C  spironolactone (ALDACTONE) 100 MG tablet Take 100 mg by mouth  daily.   Yes [provider]  tamsulosin (FLOMAX) 0.4 MG CAPS capsule Take 1 capsule (0.4 mg total) by mouth daily. 12/29/22  Yes Horton, Mayer Masker, MD  traZODone (DESYREL) 100 MG tablet Take 200 mg by mouth at bedtime.   Yes [provider]  UBRELVY 100 MG TABS Take by mouth. 08/03/22  Yes [provider]  venlafaxine (EFFEXOR) 37.5 MG tablet Take 37.5 mg by mouth 2 (two) times daily.   Yes [provider]  albuterol (VENTOLIN HFA) 108 (90 Base) MCG/ACT inhaler Inhale into the lungs as needed. Patient not taking: Reported on 08/18/2022 01/28/20   [provider]  belimumab 10 mg/kg in sodium chloride 0.9 % Inject 10 mg/kg into the vein once.    [provider]  clotrimazole (LOTRIMIN) 1 % cream Apply 1 Application topically 2 (two) times daily. 08/23/22   Gearldine Bienenstock, PA-C  fluconazole (DIFLUCAN) 150 MG tablet Take 1 tablet by mouth every other day for a total of 3 doses. 08/23/22   Gearldine Bienenstock, PA-C  fluticasone (FLONASE) 50 MCG/ACT nasal spray Place 1 spray into both nostrils daily.    [provider]  tirzepatide (ZEPBOUND) 2.5 MG/0.5ML Pen Inject 2.5 mg into the skin once a week. 09/14/22   Panosh, Neta Mends, MD    Physical Exam: BP 120/78   Pulse 79   Temp 98.3 F (36.8 C)   Resp 14   Ht 5\' 5"  (1.651 m)   Wt 83.9 kg   LMP  12/15/2022 (Exact Date) Comment: negative beta HCG 12/29/22  SpO2 98%   BMI 30.79 kg/m   General:  Alert, oriented, calm, in no acute distress  Eyes: EOMI, clear conjuctivae, white sclerea Neck: supple, no masses, trachea mildline  Cardiovascular: RRR, no murmurs or rubs, no peripheral edema  Respiratory: clear to auscultation bilaterally, no wheezes, no crackles  Abdomen: soft, nontender, minimally distended, normal bowel tones heard  Skin: dry, no rashes  Musculoskeletal: no joint effusions, normal range of motion  Psychiatric: appropriate affect, normal speech  Neurologic: extraocular muscles intact, clear speech, moving all extremities with intact sensorium          Labs on Admission:  Basic Metabolic Panel: Recent Labs  Lab 12/29/22 0425  NA 140  K 3.7  CL 106  CO2 21*  GLUCOSE 128*  BUN 14  CREATININE 1.28*  CALCIUM 9.2   Liver Function Tests: No results for input(s): "AST", "ALT", "ALKPHOS", "BILITOT", "PROT", "ALBUMIN" in the last 168 hours. No results for input(s): "LIPASE", "AMYLASE" in the last 168 hours. No results for input(s): "AMMONIA" in the last 168 hours. CBC: Recent Labs  Lab 12/29/22 0425  WBC 11.1*  NEUTROABS 8.9*  HGB 13.4  HCT 39.3  MCV 81.7  PLT 237   Cardiac Enzymes: No results for input(s): "CKTOTAL", "CKMB", "CKMBINDEX", "TROPONINI" in the last 168 hours.  BNP (last 3 results) No results for input(s): "BNP" in the last 8760 hours.  ProBNP (last 3 results) No results for input(s): "PROBNP" in the last 8760 hours.  CBG: No results for input(s): "GLUCAP" in the last 168 hours.  Radiological Exams on Admission: CT Renal Stone Study  Result Date: 12/29/2022 CLINICAL DATA:  Abdominal/flank pain with stone suspected. Left flank pain for 1 week EXAM: CT ABDOMEN AND PELVIS WITHOUT CONTRAST TECHNIQUE: Multidetector CT imaging of the abdomen and pelvis was performed following the standard protocol without IV contrast. RADIATION DOSE  REDUCTION: This exam was performed according to the departmental dose-optimization  program which includes automated exposure control, adjustment of the mA and/or kV according to patient size and/or use of iterative reconstruction technique. COMPARISON:  06/02/2021 FINDINGS: Lower chest:  No contributory findings. Hepatobiliary: No focal liver abnormality.Cholecystectomy. Pancreas: Unremarkable. Spleen: Unremarkable. Adrenals/Urinary Tract: Negative adrenals. 4 mm left UPJ calculus causing slight hydronephrosis. No additional urolithiasis. Unremarkable bladder. Stomach/Bowel: No obstruction. No visible bowel inflammation. Postoperative stomach. Vascular/Lymphatic: No acute vascular abnormality. No mass or adenopathy. Reproductive:No pathologic findings. Other: No ascites or pneumoperitoneum. Musculoskeletal: No acute abnormalities. IMPRESSION: 4 mm calculus just below the left UPJ causing minimal hydronephrosis. Electronically Signed   By: Tiburcio Pea M.D.   On: 12/29/2022 05:03    Assessment/Plan This is a pleasant 52 year old female with a history of lupus, depression admitted to the hospital with several days of abdominal discomfort and left flank pain found to have a 4 mm left-sided kidney stone with minimal hydronephrosis.  She is being admitted to the hospitalist service after undergoing retrograde cystoscopy, left ureteroscopy, and stent placement.  4 mm nephrolithiasis with minimal hydronephrosis-now status post cystoscopy and stent placement.  Urinalysis suspicious for acute infection, so being treated empirically for a UTI. -Observation admission -Regular diet -Follow-up urine culture -Continue empiric IV Rocephin for now  Lupus-Continue Plaquenil  Hypertension-continue Aldactone  Depression-Effexor  DVT prophylaxis: Lovenox     Code Status: Full Code  Consults called: None  Admission status: Observation  Time spent: 48 minutes   Sharlette Dense MD [facility] Pager  [phone]  If 7PM-7AM, please contact night-coverage [email or web address] Password [facility]  12/29/2022, 2:45 PM

## 2022-12-29 NOTE — Consult Note (Signed)
Urology Consult  Referring physician: Rolly Pancake Reason for referral: infected ureteral stone  Chief Complaint: infected ureteral stone  History of Present Illness: Patient presented to the emergency room with left flank pain.  She has had some nausea and vomiting.  She took an oxycodone at home.  She was doing reasonably well the emergency room but then had changes in her vitals and had positive urinalysis. [facility] notified to accept with sepsis protocol. Cultures sent. Given ceftriazone.   Patient was having generalized abdominal discomfort and thought she had the flu in the last few days.  In the last several hours she developed left flank pain and wondered if she had a stone.  She may have had some low-grade chills but no true fever but normally does not get 1.  Patient has passed kidney stones before but has never had surgery.  No bladder surgery.  Gets approximately 2 bladder infections a year and usually knows when she is symptomatic.  She was surprised today when they told her her urine looked infected because she was not having cystitis symptoms.  She does have lupus and she says is more prone to infections.  Lactic acid 1.8 WBC 11.1 Cr 1.28  CT scan 4 mm calculus just below the left UPJ causing minimal hydronephrosis.      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    Shingles    Past Surgical History:  Procedure Laterality Date   BUNIONECTOMY Right    CHOLECYSTECTOMY     COLONOSCOPY     HIATAL HERNIA REPAIR     with gastric sleeve   LAPAROSCOPIC CHOLECYSTECTOMY SINGLE SITE WITH INTRAOPERATIVE CHOLANGIOGRAM N/A 06/14/2017   Procedure: LAPAROSCOPIC CHOLECYSTECTOMY SINGLE  SITE;  Surgeon: Karie Soda, MD;  Location: [facility];  Service: General;  Laterality: N/A;   LAPAROSCOPIC GASTRIC SLEEVE RESECTION  09/2013   LASER ABLATION     Vascular   NASAL SINUS SURGERY  12/2021   TONSILLECTOMY     WISDOM TOOTH EXTRACTION      Medications: I have reviewed the patient's current medications. Allergies:  Allergies  Allergen Reactions   Contrast Media [Iodinated Contrast Media]    Iohexol      Code: HIVES, Desc: 1 hive developed on hand post injection of 125cc's Omni 300., Onset Date: 09811914    Penicillins Other (See Comments)    Has patient had a PCN reaction causing immediate rash, facial/tongue/throat swelling, SOB or lightheadedness with hypotension: Yes Has patient had a PCN reaction causing severe rash involving mucus membranes or skin necrosis: No Has patient had a PCN reaction that required hospitalization: No Has patient had a PCN reaction occurring within the last 10 years: No If all of the above answers are "NO", then may proceed with Cephalosporin use.   Gadolinium Derivatives Hives, Itching and Rash    Family History  Problem Relation Age of Onset   Varicose Veins Mother    Asthma Mother    Myasthenia gravis Mother        Ocular   Varicose Veins Sister    Arthritis Maternal  Grandmother    Arthritis Paternal Grandfather    Hearing loss Paternal Grandfather    Healthy Son    Healthy Son    Birth defects Paternal Aunt        Cognitively Challenged   Miscarriages / Stillbirths Paternal Aunt    Varicose Veins Paternal Aunt    Varicose Veins Paternal Aunt    Melanoma Paternal Aunt        melanoma x2   Prostate cancer Cousin    Colon cancer Neg Hx    Colon polyps Neg Hx    Esophageal cancer Neg Hx    Rectal cancer Neg Hx    Stomach cancer Neg Hx    Social History:  reports that she has never smoked. She has never been exposed to tobacco smoke. She has never used smokeless tobacco. She reports current alcohol use. She reports that she does  not use drugs.  ROS: All systems are reviewed and negative except as noted. Rest negative  Physical Exam:  Vital signs in last 24 hours: Temp:  [98.3 F (36.8 C)-98.8 F (37.1 C)] 98.8 F (37.1 C) (08/14 1124) Pulse Rate:  [77-111] 98 (08/14 1124) Resp:  [16-22] 16 (08/14 1124) BP: (92-136)/(60-83) 116/77 (08/14 1124) SpO2:  [95 %-100 %] 96 % (08/14 1124) Weight:  [81.6 kg-83.9 kg] 83.9 kg (08/14 1124)  Cardiovascular: Skin warm; not flushed Respiratory: Breaths quiet; no shortness of breath Abdomen: No masses Neurological: Normal sensation to touch Musculoskeletal: Normal motor function arms and legs Lymphatics: No inguinal adenopathy Skin: No rashes Genitourinary: Nontoxic.  No CVA tenderness.  No abdominal tenderness.  Vitals normal  Laboratory Data:  Results for orders placed or performed during the hospital encounter of 12/29/22 (from the past 72 hour(s))  CBC with Differential     Status: Abnormal   Collection Time: 12/29/22  4:25 AM  Result Value Ref Range   WBC 11.1 (H) 4.0 - 10.5 K/uL   RBC 4.81 3.87 - 5.11 MIL/uL   Hemoglobin 13.4 12.0 - 15.0 g/dL   HCT 28.4 13.2 - 44.0 %   MCV 81.7 80.0 - 100.0 fL   MCH 27.9 26.0 - 34.0 pg   MCHC 34.1 30.0 - 36.0 g/dL   RDW 10.2 72.5 - 36.6 %   Platelets 237 150 - 400 K/uL   nRBC 0.0 0.0 - 0.2 %   Neutrophils Relative % 80 %   Neutro Abs 8.9 (H) 1.7 - 7.7 K/uL   Lymphocytes Relative 10 %   Lymphs Abs 1.1 0.7 - 4.0 K/uL   Monocytes Relative 9 %   Monocytes Absolute 1.0 0.1 - 1.0 K/uL   Eosinophils Relative 1 %   Eosinophils Absolute 0.1 0.0 - 0.5 K/uL   Basophils Relative 0 %   Basophils Absolute 0.0 0.0 - 0.1 K/uL   Immature Granulocytes 0 %   Abs Immature Granulocytes 0.03 0.00 - 0.07 K/uL    Comment: Performed at [facility], [address], [place], [place] [address]  Basic metabolic panel     Status: Abnormal   Collection Time: 12/29/22  4:25 AM  Result Value Ref Range   Sodium 140 135 - 145  mmol/L   Potassium 3.7 3.5 - 5.1 mmol/L   Chloride 106 98 - 111 mmol/L   CO2 21 (L) 22 - 32 mmol/L   Glucose, Bld 128 (H) 70 - 99 mg/dL    Comment: Glucose reference range applies only to samples taken after fasting for at least 8 hours.  BUN 14 6 - 20 mg/dL   Creatinine, Ser 9.14 (H) 0.44 - 1.00 mg/dL   Calcium 9.2 8.9 - 78.2 mg/dL   GFR, Estimated 50 (L) >60 mL/min    Comment: (NOTE) Calculated using the CKD-EPI Creatinine Equation (2021)    Anion gap 13 5 - 15    Comment: Performed at [facility], [address] [place], [place] [address]  hCG, quantitative, pregnancy     Status: None   Collection Time: 12/29/22  4:30 AM  Result Value Ref Range   hCG, Beta Chain, Quant, S <1 <5 mIU/mL    Comment:          GEST. AGE      CONC.  (mIU/mL)   <=1 WEEK        5 - 50     2 WEEKS       50 - 500     3 WEEKS       100 - 10,000     4 WEEKS     1,000 - 30,000     5 WEEKS     3,500 - 115,000   6-8 WEEKS     12,000 - 270,000    12 WEEKS     15,000 - 220,000        FEMALE AND NON-PREGNANT FEMALE:     LESS THAN 5 mIU/mL Performed at [facility], [address] [place], [place] [address]   Urinalysis, Routine w reflex microscopic -Urine, Clean Catch     Status: Abnormal   Collection Time: 12/29/22  7:00 AM  Result Value Ref Range   Color, Urine YELLOW YELLOW   APPearance HAZY (A) CLEAR   Specific Gravity, Urine 1.036 (H) 1.005 - 1.030   pH 5.5 5.0 - 8.0   Glucose, UA NEGATIVE NEGATIVE mg/dL   Hgb urine dipstick MODERATE (A) NEGATIVE   Bilirubin Urine NEGATIVE NEGATIVE   Ketones, ur 15 (A) NEGATIVE mg/dL   Protein, ur TRACE (A) NEGATIVE mg/dL   Nitrite NEGATIVE NEGATIVE   Leukocytes,Ua TRACE (A) NEGATIVE   RBC / HPF 11-20 0 - 5 RBC/hpf   WBC, UA 11-20 0 - 5 WBC/hpf   Bacteria, UA MANY (A) NONE SEEN   Squamous Epithelial / HPF 0-5 0 - 5 /HPF   Mucus PRESENT    Budding Yeast PRESENT    Hyaline Casts, UA PRESENT     Comment: Performed at  [facility], [address], [place], [place] [address]  Lactic acid, plasma     Status: None   Collection Time: 12/29/22  9:13 AM  Result Value Ref Range   Lactic Acid, Venous 1.6 0.5 - 1.9 mmol/L    Comment: Performed at [facility], [address] [place], [place] [address]  Lactic acid, plasma     Status: None   Collection Time: 12/29/22 10:52 AM  Result Value Ref Range   Lactic Acid, Venous 1.8 0.5 - 1.9 mmol/L    Comment: Performed at [facility], [address] [place], [place], [place] [address]   No results found for this or any previous visit (from the past 240 hour(s)). Creatinine: Recent Labs    12/29/22 0425  CREATININE 1.28*    Xrays: See report/chart Reviewed  Impression/Assessment:  Picture drawn.  Patient has proximal left ureteral stone.  She gets hives with dialyzed and this was discussed.  I will use a low volume and low pressure and be very careful not to get extravasation.  I went over the cystoscopy retrograde ureterogram and insertion of stent in full detail.  Pros cons risk described.  Ureteral injury and sequelae discussed.  Role of percutaneous tube discussed.  Role of observation for possible sepsis post procedure discussed.  Admission to hospitalist service discussed.  Plan:  As noted above with consent signed   A  12/29/2022, 11:29 AM

## 2022-12-29 NOTE — Transfer of Care (Signed)
Immediate Anesthesia Transfer of Care Note  Patient: Jordan Mayer  Procedure(s) Performed: CYSTOSCOPY WITH LEFT RETROGRADE PYELOGRAM/URETERAL STENT PLACEMENT (Left)  Patient Location: PACU  Anesthesia Type:General  Level of Consciousness: awake, alert , and oriented  Airway & Oxygen Therapy: Patient Spontanous Breathing and Patient connected to nasal cannula oxygen  Post-op Assessment: Report given to RN and Post -op Vital signs reviewed and stable  Post vital signs: Reviewed and stable  Last Vitals:  Vitals Value Taken Time  BP 118/78 12/29/22 1315  Temp 36.8 C 12/29/22 1312  Pulse 86 12/29/22 1318  Resp 12 12/29/22 1318  SpO2 100 % 12/29/22 1318  Vitals shown include unfiled device data.  Last Pain:  Vitals:   12/29/22 1312  TempSrc:   PainSc: 0-No pain         Complications: No notable events documented.

## 2022-12-29 NOTE — Anesthesia Procedure Notes (Signed)
Procedure Name: LMA Insertion Date/Time: 12/29/2022 12:34 PM  Performed by: Theodosia Quay, CRNAPre-anesthesia Checklist: Patient identified, Emergency Drugs available, Suction available and Patient being monitored Patient Re-evaluated:Patient Re-evaluated prior to induction Oxygen Delivery Method: Circle System Utilized Preoxygenation: Pre-oxygenation with 100% oxygen Induction Type: IV induction Ventilation: Mask ventilation without difficulty LMA: LMA inserted LMA Size: 4.0 Number of attempts: 1 Airway Equipment and Method: Bite block Placement Confirmation: positive ETCO2 Tube secured with: Tape Dental Injury: Teeth and Oropharynx as per pre-operative assessment

## 2022-12-30 ENCOUNTER — Encounter (HOSPITAL_COMMUNITY): Payer: Self-pay | Admitting: Urology

## 2022-12-30 DIAGNOSIS — N200 Calculus of kidney: Secondary | ICD-10-CM | POA: Diagnosis not present

## 2022-12-30 LAB — CBC
HCT: 37.8 % (ref 36.0–46.0)
Hemoglobin: 12 g/dL (ref 12.0–15.0)
MCH: 28 pg (ref 26.0–34.0)
MCHC: 31.7 g/dL (ref 30.0–36.0)
MCV: 88.1 fL (ref 80.0–100.0)
Platelets: 177 10*3/uL (ref 150–400)
RBC: 4.29 MIL/uL (ref 3.87–5.11)
RDW: 13.3 % (ref 11.5–15.5)
WBC: 5.3 10*3/uL (ref 4.0–10.5)
nRBC: 0 % (ref 0.0–0.2)

## 2022-12-30 LAB — URINE CULTURE: Culture: NO GROWTH

## 2022-12-30 LAB — BASIC METABOLIC PANEL
Anion gap: 5 (ref 5–15)
BUN: 8 mg/dL (ref 6–20)
CO2: 24 mmol/L (ref 22–32)
Calcium: 8.4 mg/dL — ABNORMAL LOW (ref 8.9–10.3)
Chloride: 109 mmol/L (ref 98–111)
Creatinine, Ser: 1.06 mg/dL — ABNORMAL HIGH (ref 0.44–1.00)
GFR, Estimated: >60 mL/min (ref >60)
Glucose, Bld: 89 mg/dL (ref 70–99)
Potassium: 3.7 mmol/L (ref 3.5–5.1)
Sodium: 138 mmol/L (ref 135–145)

## 2022-12-30 LAB — TSH: TSH: 2.478 u[IU]/mL (ref 0.350–4.500)

## 2022-12-30 LAB — HIV ANTIBODY (ROUTINE TESTING W REFLEX): HIV Screen 4th Generation wRfx: NONREACTIVE

## 2022-12-30 MED ORDER — SULFAMETHOXAZOLE-TRIMETHOPRIM 800-160 MG PO TABS: 1.0000 | ORAL_TABLET | Freq: Two times a day (BID) | ORAL | 0 refills | Status: AC

## 2022-12-30 MED ORDER — ONDANSETRON HCL 4 MG PO TABS: 4.0000 mg | ORAL_TABLET | Freq: Every day | ORAL | 1 refills | Status: DC | PRN

## 2022-12-30 MED ORDER — HYDRALAZINE HCL 20 MG/ML IJ SOLN: 10.0000 mg | INTRAMUSCULAR | Status: DC | PRN

## 2022-12-30 MED ORDER — METOPROLOL TARTRATE 5 MG/5ML IV SOLN: 5.0000 mg | INTRAVENOUS | Status: DC | PRN

## 2022-12-30 MED ORDER — GUAIFENESIN 100 MG/5ML PO LIQD: 5.0000 mL | ORAL | Status: DC | PRN

## 2022-12-30 MED ORDER — TRAZODONE HCL 50 MG PO TABS: 50.0000 mg | ORAL_TABLET | Freq: Every evening | ORAL | Status: DC | PRN

## 2022-12-30 MED ORDER — IPRATROPIUM-ALBUTEROL 0.5-2.5 (3) MG/3ML IN SOLN: 3.0000 mL | RESPIRATORY_TRACT | Status: DC | PRN

## 2022-12-30 MED ORDER — SENNOSIDES-DOCUSATE SODIUM 8.6-50 MG PO TABS: 1.0000 | ORAL_TABLET | Freq: Every evening | ORAL | Status: DC | PRN

## 2022-12-30 NOTE — Progress Notes (Signed)
PROGRESS NOTE    NHI Jordan  Mayer:[id number] DOB: 12-12-1970 DOA: 12/29/2022 PCP: Madelin Headings, MD   Brief Narrative:  52 year old with history of lupus, nephrolithiasis admitted to the hospital with obstructive left-sided renal stone now status post left retrograde ureterogram and insertion of urethral stent.  Patient initially presented to [facility] where he was found to have urinary tract infection with obstructive stone.  He was started on Rocephin, EDP discussed case with urology and patient was taken to the OR for cystoscopy, left retrograde ureterogram and insertion of the stent.   Assessment & Plan:  Principal Problem:   Nephrolithiasis Active Problems:   Ureteral stone with hydronephrosis    Urinary tract infection with obstructive nephrolithiasis on the left side with mild hydronephrosis - Status post cystoscopies with stent placement by urology on 8/14.  Dc today by urology on PO Abx  Lupus - Plaquenil  Essential hypertension with history of PCOS - On Aldactone.  IV as needed  Depression - Effexor for   History of bariatric surgery    DVT prophylaxis: enoxaparin (LOVENOX) injection 40 mg Start: 12/29/22 2200 SCDs Start: 12/29/22 1449 Code Status: Full code Family Communication:  Family at bedside Dc today per urology.       Diet Orders (From admission, onward)     Start     Ordered   12/29/22 1449  Diet regular Room service appropriate? Yes; Fluid consistency: Thin  Diet effective now       Question Answer Comment  Room service appropriate? Yes   Fluid consistency: Thin      12/29/22 1448            Subjective: Doing ok, wants to go home   Examination:  General exam: Appears calm and comfortable  Respiratory system: Clear to auscultation. Respiratory effort normal. Cardiovascular system: S1 & S2 heard, RRR. No JVD, murmurs, rubs, gallops or clicks. No pedal edema. Gastrointestinal system: Abdomen is nondistended, soft and  nontender. No organomegaly or masses felt. Normal bowel sounds heard. Central nervous system: Alert and oriented. No focal neurological deficits. Extremities: Symmetric 5 x 5 power. Skin: No rashes, lesions or ulcers Psychiatry: Judgement and insight appear normal. Mood & affect appropriate.  Objective: Vitals:   12/29/22 1457 12/29/22 1852 12/29/22 2226 12/30/22 0254  BP: 117/79 112/73 (!) 106/59 136/83  Pulse: 78 80 91 86  Resp: 14  16 18   Temp: 97.6 F (36.4 C) 98.5 F (36.9 C) 97.8 F (36.6 C) 98.3 F (36.8 C)  TempSrc: Oral Oral Oral Oral  SpO2: 100% 97% 100% 100%  Weight:      Height:        Intake/Output Summary (Last 24 hours) at 12/30/2022 0732 Last data filed at 12/30/2022 9562 Gross per 24 hour  Intake 1230.33 ml  Output 1100 ml  Net 130.33 ml   Filed Weights   12/29/22 0419 12/29/22 1124  Weight: 81.6 kg 83.9 kg    Scheduled Meds:  enoxaparin (LOVENOX) injection  40 mg Subcutaneous Q24H   hydroxychloroquine  200 mg Oral 2 times per day on Monday Tuesday Wednesday Thursday Friday   pneumococcal 20-valent conjugate vaccine  0.5 mL Intramuscular Tomorrow-1000   spironolactone  100 mg Oral Daily   traZODone  200 mg Oral QHS   venlafaxine  37.5 mg Oral BID   Continuous Infusions:  cefTRIAXone (ROCEPHIN)  IV      Nutritional status     Body mass index is 30.79 kg/m.  Data Reviewed:  CBC: Recent Labs  Lab 12/29/22 0425 12/30/22 0452  WBC 11.1* 5.3  NEUTROABS 8.9*  --   HGB 13.4 12.0  HCT 39.3 37.8  MCV 81.7 88.1  PLT 237 177   Basic Metabolic Panel: Recent Labs  Lab 12/29/22 0425 12/30/22 0452  NA 140 138  K 3.7 3.7  CL 106 109  CO2 21* 24  GLUCOSE 128* 89  BUN 14 8  CREATININE 1.28* 1.06*  CALCIUM 9.2 8.4*   GFR: Estimated Creatinine Clearance: 66.4 mL/min (A) (by C-G formula based on SCr of 1.06 mg/dL (H)). Liver Function Tests: No results for input(s): "AST", "ALT", "ALKPHOS", "BILITOT", "PROT", "ALBUMIN" in the last 168  hours. No results for input(s): "LIPASE", "AMYLASE" in the last 168 hours. No results for input(s): "AMMONIA" in the last 168 hours. Coagulation Profile: No results for input(s): "INR", "PROTIME" in the last 168 hours. Cardiac Enzymes: No results for input(s): "CKTOTAL", "CKMB", "CKMBINDEX", "TROPONINI" in the last 168 hours. BNP (last 3 results) No results for input(s): "PROBNP" in the last 8760 hours. HbA1C: No results for input(s): "HGBA1C" in the last 72 hours. CBG: No results for input(s): "GLUCAP" in the last 168 hours. Lipid Profile: No results for input(s): "CHOL", "HDL", "LDLCALC", "TRIG", "CHOLHDL", "LDLDIRECT" in the last 72 hours. Thyroid Function Tests: No results for input(s): "TSH", "T4TOTAL", "FREET4", "T3FREE", "THYROIDAB" in the last 72 hours. Anemia Panel: No results for input(s): "VITAMINB12", "FOLATE", "FERRITIN", "TIBC", "IRON", "RETICCTPCT" in the last 72 hours. Sepsis Labs: Recent Labs  Lab 12/29/22 0913 12/29/22 1052  LATICACIDVEN 1.6 1.8    No results found for this or any previous visit (from the past 240 hour(s)).       Radiology Studies: CT Renal Stone Study  Result Date: 12/29/2022 CLINICAL DATA:  Abdominal/flank pain with stone suspected. Left flank pain for 1 week EXAM: CT ABDOMEN AND PELVIS WITHOUT CONTRAST TECHNIQUE: Multidetector CT imaging of the abdomen and pelvis was performed following the standard protocol without IV contrast. RADIATION DOSE REDUCTION: This exam was performed according to the departmental dose-optimization program which includes automated exposure control, adjustment of the mA and/or kV according to patient size and/or use of iterative reconstruction technique. COMPARISON:  06/02/2021 FINDINGS: Lower chest:  No contributory findings. Hepatobiliary: No focal liver abnormality.Cholecystectomy. Pancreas: Unremarkable. Spleen: Unremarkable. Adrenals/Urinary Tract: Negative adrenals. 4 mm left UPJ calculus causing slight  hydronephrosis. No additional urolithiasis. Unremarkable bladder. Stomach/Bowel: No obstruction. No visible bowel inflammation. Postoperative stomach. Vascular/Lymphatic: No acute vascular abnormality. No mass or adenopathy. Reproductive:No pathologic findings. Other: No ascites or pneumoperitoneum. Musculoskeletal: No acute abnormalities. IMPRESSION: 4 mm calculus just below the left UPJ causing minimal hydronephrosis. Electronically Signed   By: Tiburcio Pea M.D.   On: 12/29/2022 05:03           LOS: 0 days   Time spent= 35 mins     Joline Maxcy, MD [facility]  If 7PM-7AM, please contact night-coverage  12/30/2022, 7:32 AM

## 2022-12-30 NOTE — Plan of Care (Signed)
  Problem: Nutrition: Goal: Adequate nutrition will be maintained Outcome: Progressing   Problem: Coping: Goal: Level of anxiety will decrease Outcome: Progressing   

## 2022-12-30 NOTE — Discharge Summary (Signed)
Patient ID: Jordan Mayer MRN: [id number] DOB/AGE: 1971/03/23 52 y.o.  Admit date: 12/29/2022 Discharge date: 12/30/2022  Primary Care Physician:  Madelin Headings, MD  Discharge Diagnoses:   Present on Admission:  Ureteral stone with hydronephrosis   Consults:  urology     Discharge Medications: Allergies as of 12/30/2022       Reactions   Contrast Media [iodinated Contrast Media]    Iohexol     Code: HIVES, Desc: 1 hive developed on hand post injection of 125cc's Omni 300., Onset Date: [id number]   Penicillins Other (See Comments)   Has patient had a PCN reaction causing immediate rash, facial/tongue/throat swelling, SOB or lightheadedness with hypotension: Yes Has patient had a PCN reaction causing severe rash involving mucus membranes or skin necrosis: No Has patient had a PCN reaction that required hospitalization: No Has patient had a PCN reaction occurring within the last 10 years: No If all of the above answers are "NO", then may proceed with Cephalosporin use.   Gadolinium Derivatives Hives, Itching, Rash        Medication List     STOP taking these medications    ondansetron 8 MG disintegrating tablet Commonly known as: ZOFRAN-ODT       TAKE these medications    Benlysta 400 MG Solr injection Generic drug: belimumab Inject 10 mg/kg into the vein once.   D3 PO Take by mouth. 2000 mg daily   Dapsone 7.5 % Gel SMARTSIG:Sparingly Topical Every Night   Emgality 120 MG/ML Soaj Generic drug: Galcanezumab-gnlm ADMINISTER 1 ML UNDER THE SKIN EVERY 30 DAYS   EPINEPHrine 0.3 mg/0.3 mL Soaj injection Commonly known as: EPI-PEN   [name] 24 Fe 1-20 MG-MCG(24) tablet Generic drug: Norethindrone Acetate-Ethinyl Estrad-FE Take 1 tablet by mouth daily.   hydroxychloroquine 200 MG tablet Commonly known as: PLAQUENIL Take 200mg  by mouth twice daily, Monday through Friday only. None on Saturday or Sunday.   multivitamin tablet Take 1 tablet by mouth daily.    ondansetron 4 MG tablet Commonly known as: Zofran Take 1 tablet (4 mg total) by mouth daily as needed for nausea or vomiting.   oxyCODONE-acetaminophen 5-325 MG tablet Commonly known as: PERCOCET/ROXICET Take 1 tablet by mouth every 6 (six) hours as needed for severe pain.   spironolactone 100 MG tablet Commonly known as: ALDACTONE Take 100 mg by mouth daily.   sulfamethoxazole-trimethoprim 800-160 MG tablet Commonly known as: BACTRIM DS Take 1 tablet by mouth 2 (two) times daily for 6 days.   tamsulosin 0.4 MG Caps capsule Commonly known as: Flomax Take 1 capsule (0.4 mg total) by mouth daily.   traZODone 100 MG tablet Commonly known as: DESYREL Take 2 tablets (200 mg total) by mouth at bedtime.   Ubrelvy 100 MG Tabs Generic drug: Ubrogepant Take by mouth.   venlafaxine 37.5 MG tablet Commonly known as: EFFEXOR Take 1 tablet (37.5 mg total) by mouth 2 (two) times daily.         Significant Diagnostic Studies:  CT Renal Stone Study  Result Date: 12/29/2022 CLINICAL DATA:  Abdominal/flank pain with stone suspected. Left flank pain for 1 week EXAM: CT ABDOMEN AND PELVIS WITHOUT CONTRAST TECHNIQUE: Multidetector CT imaging of the abdomen and pelvis was performed following the standard protocol without IV contrast. RADIATION DOSE REDUCTION: This exam was performed according to the departmental dose-optimization program which includes automated exposure control, adjustment of the mA and/or kV according to patient size and/or use of iterative reconstruction technique. COMPARISON:  06/02/2021 FINDINGS: Lower  chest:  No contributory findings. Hepatobiliary: No focal liver abnormality.Cholecystectomy. Pancreas: Unremarkable. Spleen: Unremarkable. Adrenals/Urinary Tract: Negative adrenals. 4 mm left UPJ calculus causing slight hydronephrosis. No additional urolithiasis. Unremarkable bladder. Stomach/Bowel: No obstruction. No visible bowel inflammation. Postoperative stomach.  Vascular/Lymphatic: No acute vascular abnormality. No mass or adenopathy. Reproductive:No pathologic findings. Other: No ascites or pneumoperitoneum. Musculoskeletal: No acute abnormalities. IMPRESSION: 4 mm calculus just below the left UPJ causing minimal hydronephrosis. Electronically Signed   By: Tiburcio Pea M.D.   On: 12/29/2022 05:03  Hospital Course:  Principal Problem:   Nephrolithiasis Active Problems:   Ureteral stone with hydronephrosis  For complete details please refer to admission H and P, but in brief patient was transferred to [facility] for obstructive ureteral stone with concern for developing sepsis.  On morning of discharge mild leukocytosis has resolved, renal function is nearly at baseline, and patient has no symptoms of systemic infectious process.  She feels well and is comfortable going home on empiric antibiotics.  Close follow-up will be scheduled for definitive stone management on an outpatient basis.  Case and plan was reviewed with her and her husband.  All questions were answered to their satisfaction.  Day of Discharge BP 136/83 (BP Location: Left Arm)   Pulse 86   Temp 98.3 F (36.8 C) (Oral)   Resp 18   Ht 5\' 5"  (1.651 m)   Wt 83.9 kg   LMP 12/15/2022 (Exact Date) Comment: negative beta HCG 12/29/22  SpO2 100%   BMI 30.79 kg/m   Results for orders placed or performed during the hospital encounter of 12/29/22 (from the past 24 hour(s))  Lactic acid, plasma     Status: None   Collection Time: 12/29/22 10:52 AM  Result Value Ref Range   Lactic Acid, Venous 1.8 0.5 - 1.9 mmol/L  HIV Antibody (routine testing w rflx)     Status: None   Collection Time: 12/29/22  5:04 PM  Result Value Ref Range   HIV Screen 4th Generation wRfx Non Reactive Non Reactive  Basic metabolic panel     Status: Abnormal   Collection Time: 12/30/22  4:52 AM  Result Value Ref Range   Sodium 138 135 - 145 mmol/L   Potassium 3.7 3.5 - 5.1 mmol/L   Chloride 109 98 - 111  mmol/L   CO2 24 22 - 32 mmol/L   Glucose, Bld 89 70 - 99 mg/dL   BUN 8 6 - 20 mg/dL   Creatinine, Ser 1.61 (H) 0.44 - 1.00 mg/dL   Calcium 8.4 (L) 8.9 - 10.3 mg/dL   GFR, Estimated >09 >60 mL/min   Anion gap 5 5 - 15  CBC     Status: None   Collection Time: 12/30/22  4:52 AM  Result Value Ref Range   WBC 5.3 4.0 - 10.5 K/uL   RBC 4.29 3.87 - 5.11 MIL/uL   Hemoglobin 12.0 12.0 - 15.0 g/dL   HCT 45.4 09.8 - 11.9 %   MCV 88.1 80.0 - 100.0 fL   MCH 28.0 26.0 - 34.0 pg   MCHC 31.7 30.0 - 36.0 g/dL   RDW 14.7 82.9 - 56.2 %   Platelets 177 150 - 400 K/uL   nRBC 0.0 0.0 - 0.2 %    Physical Exam: General: Alert and awake oriented x3 not in any acute distress. HEENT: anicteric sclera, pupils reactive to light and accommodation CVS: S1-S2 clear no murmur rubs or gallops Chest: clear to auscultation bilaterally, no wheezing rales or rhonchi  Abdomen: soft nontender, nondistended, normal bowel sounds, no organomegaly Extremities: no cyanosis, clubbing or edema noted bilaterally Neuro: Cranial nerves II-XII intact, no focal neurological deficits  Disposition:  Home with husband  Diet:  regular  Activity:  as tolerated  DISCHARGE FOLLOW-UP   Follow-up Information     [facility].   Contact information: [address] [place] [address] [phone]                Time spent on Discharge:   A total of 38 mins spent on planning and discharge  Signed: Scherrie Bateman  12/30/2022, 9:48 AM

## 2022-12-30 NOTE — TOC Transition Note (Signed)
Transition of Care [facility]) - CM/SW Discharge Note   Patient Details  Name: Jordan Mayer MRN: [id number] Date of Birth: 01-09-1971  Transition of Care [facility]) CM/SW Contact:  Lanier Clam, RN Phone Number: 12/30/2022, 9:36 AM   Clinical Narrative: d/c home no orders or needs.      Final next level of care: Home/Self Care Barriers to Discharge: No Barriers Identified   Patient Goals and CMS Choice CMS Medicare.gov Compare Post Acute Care list provided to:: Patient Choice offered to / list presented to : Patient  Discharge Placement                         Discharge Plan and Services Additional resources added to the After Visit Summary for     Discharge Planning Services: CM Consult                                 Social Determinants of Health (SDOH) Interventions SDOH Screenings   Food Insecurity: No Food Insecurity (12/29/2022)  Housing: Low Risk  (12/29/2022)  Transportation Needs: No Transportation Needs (12/29/2022)  Utilities: Not At Risk (12/29/2022)  Depression (PHQ2-9): Medium Risk (11/05/2021)  Social Connections: Unknown (01/22/2022)   Received from [facility]  Tobacco Use: Low Risk  (12/29/2022)     Readmission Risk Interventions     No data to display

## 2023-01-02 ENCOUNTER — Other Ambulatory Visit: Payer: Self-pay | Admitting: Psychiatry

## 2023-01-03 ENCOUNTER — Other Ambulatory Visit: Payer: Self-pay | Admitting: Urology

## 2023-01-03 ENCOUNTER — Telehealth: Payer: Self-pay

## 2023-01-03 LAB — CULTURE, BLOOD (ROUTINE X 2)
Culture: NO GROWTH
Culture: NO GROWTH
Special Requests: ADEQUATE
Special Requests: ADEQUATE

## 2023-01-03 NOTE — Transitions of Care (Post Inpatient/ED Visit) (Unsigned)
   01/03/2023  Name: Jordan Mayer MRN: [id number] DOB: 1971-05-02  Today's TOC FU Call Status: Today's TOC FU Call Status:: Unsuccessful Call (1st Attempt) Unsuccessful Call (1st Attempt) Date: 01/03/23  Attempted to reach the patient regarding the most recent Inpatient/ED visit.  Follow Up Plan: Additional outreach attempts will be made to reach the patient to complete the Transitions of Care (Post Inpatient/ED visit) call.   Signature   Woodfin Ganja LPN [facility] Nurse Health Advisor Direct Dial [phone]

## 2023-01-04 ENCOUNTER — Other Ambulatory Visit: Payer: Self-pay

## 2023-01-04 ENCOUNTER — Encounter (HOSPITAL_BASED_OUTPATIENT_CLINIC_OR_DEPARTMENT_OTHER): Payer: Self-pay | Admitting: Urology

## 2023-01-04 NOTE — Telephone Encounter (Signed)
Last seen on 06/22/22 No follow up scheduled

## 2023-01-04 NOTE — Progress Notes (Addendum)
Spoke w/ via phone for pre-op interview---pt Lab needs dos---- urine preg              Lab results------cbc, bmp 12-30-2022 COVID test -----patient states asymptomatic no test needed Arrive at -------1045 [id number] NPO after MN NO Solid Food.  Clear liquids from MN until---945 Med rec completed Medications to take morning of surgery -----hailey, venlafaxine, toradol prn Diabetic medication -----n/a Patient instructed no nail polish to be worn day of surgery Patient instructed to bring photo id and insurance card day of surgery Patient aware to have Driver (ride ) / caregiver    for 24 hours after surgery  Jordan Mayer husband Patient Special Instructions -----none Pre-Op special Instructions -----none Patient verbalized understanding of instructions that were given at this phone interview. Patient denies shortness of breath, chest pain, fever, cough at this phone interview.  Spoke with dr Nance Pew mda and made aware pt husband tested positive for covid 12-20-2022, his only symptoms is sleepiness, pt ok for surgery 01-05-2023 if home test for covid is negative today. Spoke with patient and covid home test was negative today.

## 2023-01-04 NOTE — Transitions of Care (Post Inpatient/ED Visit) (Signed)
01/04/2023  Name: Jordan Mayer MRN: [id number] DOB: [birth year]  Today's TOC FU Call Status: Today's TOC FU Call Status:: Successful TOC FU Call Completed Unsuccessful Call (1st Attempt) Date: 01/03/23 [facility] FU Call Complete Date: 01/04/23  Transition Care Management Follow-up Telephone Call Date of Discharge: 12/30/22 Discharge Facility: [facility]) Type of Discharge: Inpatient Admission Primary Inpatient Discharge Diagnosis:: Nephrolithiasis How have you been since you were released from the hospital?: Same Any questions or concerns?: Yes Patient Questions/Concerns:: (S) pt is having stent removal 01-05-23 - pt was septic - pt will follow with urologist Patient Questions/Concerns Addressed: (S) Notified Provider of Patient Questions/Concerns  Items Reviewed: Did you receive and understand the discharge instructions provided?: Yes Medications obtained,verified, and reconciled?: Yes (Medications Reviewed) Any new allergies since your discharge?: No Dietary orders reviewed?: Yes Do you have support at home?: Yes  Medications Reviewed Today: Medications Reviewed Today     Reviewed by Merleen Nicely, LPN (Licensed Practical Nurse) on 01/04/23 at 1051  Med List Status: <None>   Medication Order Taking? Sig Documenting Provider Last Dose Status Informant  belimumab (BENLYSTA) 400 MG SOLR injection [id number] Yes Inject 10 mg/kg into the vein every 30 (thirty) days. Q month iv [provider] Taking Active Self  Cholecalciferol (D3 PO) [id number] Yes Take by mouth. 2000 mg daily [provider] Taking Active Self, Spouse/Significant Other  ciprofloxacin (CIPRO) 500 MG tablet [id number] Yes Take 500 mg by mouth 2 (two) times daily. [provider] Taking Active Spouse/Significant Other  EPINEPHrine 0.3 mg/0.3 mL IJ SOAJ injection [id number] Yes  [provider] Taking Active Self, Spouse/Significant Other           Med Note (CANTER, KAYLYN D    Wed Aug 25, 2022 10:45 AM) PRN  Galcanezumab-gnlm (EMGALITY) 120 MG/ML Ivory Broad [id number] Yes ADMINISTER 1 ML UNDER THE SKIN EVERY 30 DAYS McCue, Jessica, NP Taking Active   [name] 24 FE 1-20 MG-MCG(24) tablet [id number] Yes Take 1 tablet by mouth daily. [provider] Taking Active Self  hydroxychloroquine (PLAQUENIL) 200 MG tablet [id number] Yes Take 200mg  by mouth twice daily, Monday through Friday only. None on Saturday or Sunday. Gearldine Bienenstock, PA-C Taking Active Self, Spouse/Significant Other  ketorolac (TORADOL) 10 MG tablet [id number] Yes Take 10 mg by mouth every 6 (six) hours as needed. [provider] Taking Active Self, Spouse/Significant Other  Multiple Vitamin (MULTIVITAMIN) tablet [id number] Yes Take 1 tablet by mouth daily. [provider] Taking Active Self, Spouse/Significant Other  ondansetron (ZOFRAN) 4 MG tablet [id number] Yes Take 1 tablet (4 mg total) by mouth daily as needed for nausea or vomiting. Wyline Mood, NP Taking Active Self, Spouse/Significant Other  oxyCODONE-acetaminophen (PERCOCET/ROXICET) 5-325 MG tablet [id number] Yes Take 1 tablet by mouth every 6 (six) hours as needed for severe pain. Horton, Mayer Masker, MD Taking Active Self, Spouse/Significant Other  spironolactone (ALDACTONE) 100 MG tablet [id number] Yes Take 100 mg by mouth daily. [provider] Taking Active Self, Spouse/Significant Other           Med Note (CANTER, KAYLYN D   Wed Aug 25, 2022 10:49 AM)    sulfamethoxazole-trimethoprim (BACTRIM DS) 800-160 MG tablet [id number] Yes Take 1 tablet by mouth 2 (two) times daily for 6 days. Wyline Mood, NP Taking Active Self, Spouse/Significant Other  tamsulosin (FLOMAX) 0.4 MG CAPS capsule [id number] Yes Take 1 capsule (0.4 mg total) by mouth daily. Horton, Mayer Masker, MD Taking Active Self, Spouse/Significant Other  traZODone (DESYREL) 100  MG tablet [id number] Yes Take 2 tablets (200 mg total) by mouth at bedtime.  Wyline Mood, NP Taking Active Self, Spouse/Significant Other  UBRELVY 100 MG TABS [id number] Yes Take by mouth as needed. [provider] Taking Active Self, Spouse/Significant Other  venlafaxine (EFFEXOR) 37.5 MG tablet [id number] Yes Take 1 tablet (37.5 mg total) by mouth 2 (two) times daily. Wyline Mood, NP Taking Active Self, Spouse/Significant Other            Home Care and Equipment/Supplies: Were Home Health Services Ordered?: No Any new equipment or medical supplies ordered?: No  Functional Questionnaire: Do you need assistance with bathing/showering or dressing?: No Do you need assistance with meal preparation?: No Do you need assistance with eating?: No Do you have difficulty maintaining continence: No Do you need assistance with getting out of bed/getting out of a chair/moving?: No Do you have difficulty managing or taking your medications?: No  Follow up appointments reviewed: PCP Follow-up appointment confirmed?: No MD Provider Line Number:[phone] Given: Yes Specialist Hospital Follow-up appointment confirmed?: Yes Follow-Up Specialty Provider:: urologist Do you need transportation to your follow-up appointment?: No Do you understand care options if your condition(s) worsen?: Yes-patient verbalized understanding    SIGNATURE  Woodfin Ganja LPN [facility] Nurse Health Advisor Direct Dial [phone]

## 2023-01-05 ENCOUNTER — Ambulatory Visit (HOSPITAL_BASED_OUTPATIENT_CLINIC_OR_DEPARTMENT_OTHER)
Admission: RE | Admit: 2023-01-05 | Discharge: 2023-01-05 | Disposition: A | Discharge status: Home / Self Care | Payer: 59 | Attending: Urology | Admitting: Urology

## 2023-01-05 ENCOUNTER — Other Ambulatory Visit: Payer: Self-pay

## 2023-01-05 ENCOUNTER — Ambulatory Visit (HOSPITAL_BASED_OUTPATIENT_CLINIC_OR_DEPARTMENT_OTHER): Payer: 59 | Admitting: Anesthesiology

## 2023-01-05 ENCOUNTER — Encounter (HOSPITAL_BASED_OUTPATIENT_CLINIC_OR_DEPARTMENT_OTHER)
Admission: RE | Disposition: A | Discharge status: Home / Self Care | Payer: Self-pay | Source: Home / Self Care | Attending: Urology

## 2023-01-05 ENCOUNTER — Encounter (HOSPITAL_BASED_OUTPATIENT_CLINIC_OR_DEPARTMENT_OTHER): Payer: Self-pay | Admitting: Urology

## 2023-01-05 DIAGNOSIS — R519 Headache, unspecified: Secondary | ICD-10-CM | POA: Insufficient documentation

## 2023-01-05 DIAGNOSIS — Z01818 Encounter for other preprocedural examination: Secondary | ICD-10-CM

## 2023-01-05 DIAGNOSIS — F32A Depression, unspecified: Secondary | ICD-10-CM | POA: Diagnosis not present

## 2023-01-05 DIAGNOSIS — F419 Anxiety disorder, unspecified: Secondary | ICD-10-CM | POA: Diagnosis not present

## 2023-01-05 DIAGNOSIS — E282 Polycystic ovarian syndrome: Secondary | ICD-10-CM | POA: Insufficient documentation

## 2023-01-05 DIAGNOSIS — J45909 Unspecified asthma, uncomplicated: Secondary | ICD-10-CM | POA: Insufficient documentation

## 2023-01-05 DIAGNOSIS — N202 Calculus of kidney with calculus of ureter: Secondary | ICD-10-CM | POA: Diagnosis present

## 2023-01-05 HISTORY — PX: CYSTOSCOPY/URETEROSCOPY/HOLMIUM LASER/STENT PLACEMENT: SHX6546

## 2023-01-05 HISTORY — DX: Reserved for concepts with insufficient information to code with codable children: IMO0002

## 2023-01-05 HISTORY — DX: Concussion with loss of consciousness status unknown, initial encounter: S06.0XAA

## 2023-01-05 HISTORY — DX: Gastro-esophageal reflux disease without esophagitis: K21.9

## 2023-01-05 LAB — POCT PREGNANCY, URINE: Preg Test, Ur: NEGATIVE

## 2023-01-05 SURGERY — CYSTOSCOPY/URETEROSCOPY/HOLMIUM LASER/STENT PLACEMENT
Anesthesia: General | Laterality: Left

## 2023-01-05 MED ORDER — PROMETHAZINE HCL 25 MG/ML IJ SOLN: 6.2500 mg | INTRAMUSCULAR | Status: DC | PRN

## 2023-01-05 MED ORDER — MIDAZOLAM HCL 5 MG/5ML IJ SOLN
INTRAMUSCULAR | Status: DC | PRN
  Administered 2023-01-05: 2 mg via INTRAVENOUS

## 2023-01-05 MED ORDER — MIDAZOLAM HCL 2 MG/2ML IJ SOLN
INTRAMUSCULAR | Status: AC
  Filled 2023-01-05: qty 2

## 2023-01-05 MED ORDER — OXYCODONE HCL 5 MG/5ML PO SOLN: 5.0000 mg | Freq: Once | ORAL | Status: DC | PRN

## 2023-01-05 MED ORDER — PROPOFOL 10 MG/ML IV BOLUS
INTRAVENOUS | Status: DC | PRN
Start: 2023-01-05 — End: 2023-01-05
  Administered 2023-01-05 (×2): 100 mg via INTRAVENOUS

## 2023-01-05 MED ORDER — SODIUM CHLORIDE 0.9 % IR SOLN
Status: DC | PRN
  Administered 2023-01-05: 3000 mL via INTRAVESICAL

## 2023-01-05 MED ORDER — OXYCODONE HCL 5 MG PO TABS: 5.0000 mg | ORAL_TABLET | Freq: Once | ORAL | Status: DC | PRN

## 2023-01-05 MED ORDER — FENTANYL CITRATE (PF) 100 MCG/2ML IJ SOLN: 25.0000 ug | INTRAMUSCULAR | Status: DC | PRN

## 2023-01-05 MED ORDER — LACTATED RINGERS IV SOLN: INTRAVENOUS | Status: DC

## 2023-01-05 MED ORDER — DEXAMETHASONE SODIUM PHOSPHATE 4 MG/ML IJ SOLN
INTRAMUSCULAR | Status: DC | PRN
  Administered 2023-01-05: 5 mg via INTRAVENOUS

## 2023-01-05 MED ORDER — LIDOCAINE 2% (20 MG/ML) 5 ML SYRINGE
INTRAMUSCULAR | Status: DC | PRN
  Administered 2023-01-05: 100 mg via INTRAVENOUS

## 2023-01-05 MED ORDER — KETOROLAC TROMETHAMINE 30 MG/ML IJ SOLN: 30.0000 mg | Freq: Once | INTRAMUSCULAR | Status: DC | PRN

## 2023-01-05 MED ORDER — 0.9 % SODIUM CHLORIDE (POUR BTL) OPTIME
TOPICAL | Status: DC | PRN
  Administered 2023-01-05: 500 mL

## 2023-01-05 MED ORDER — ACETAMINOPHEN 500 MG PO TABS
1000.0000 mg | ORAL_TABLET | Freq: Once | ORAL | Status: AC
  Administered 2023-01-05: 1000 mg via ORAL

## 2023-01-05 MED ORDER — CIPROFLOXACIN IN D5W 400 MG/200ML IV SOLN
400.0000 mg | INTRAVENOUS | Status: AC
  Administered 2023-01-05: 400 mg via INTRAVENOUS

## 2023-01-05 MED ORDER — LIDOCAINE HCL (PF) 2 % IJ SOLN
INTRAMUSCULAR | Status: AC
  Filled 2023-01-05: qty 5

## 2023-01-05 MED ORDER — ONDANSETRON HCL 4 MG/2ML IJ SOLN
INTRAMUSCULAR | Status: AC
  Filled 2023-01-05: qty 2

## 2023-01-05 MED ORDER — AMISULPRIDE (ANTIEMETIC) 5 MG/2ML IV SOLN: 10.0000 mg | Freq: Once | INTRAVENOUS | Status: DC | PRN

## 2023-01-05 MED ORDER — DEXAMETHASONE SODIUM PHOSPHATE 10 MG/ML IJ SOLN
INTRAMUSCULAR | Status: AC
  Filled 2023-01-05: qty 1

## 2023-01-05 MED ORDER — ACETAMINOPHEN 500 MG PO TABS
ORAL_TABLET | ORAL | Status: AC
  Filled 2023-01-05: qty 2

## 2023-01-05 MED ORDER — ONDANSETRON HCL 4 MG/2ML IJ SOLN
INTRAMUSCULAR | Status: DC | PRN
Start: 2023-01-05 — End: 2023-01-05
  Administered 2023-01-05: 4 mg via INTRAVENOUS

## 2023-01-05 MED ORDER — FENTANYL CITRATE (PF) 100 MCG/2ML IJ SOLN
INTRAMUSCULAR | Status: AC
  Filled 2023-01-05: qty 2

## 2023-01-05 MED ORDER — CIPROFLOXACIN IN D5W 400 MG/200ML IV SOLN
INTRAVENOUS | Status: AC
  Filled 2023-01-05: qty 200

## 2023-01-05 MED ORDER — PROPOFOL 10 MG/ML IV BOLUS
INTRAVENOUS | Status: AC
  Filled 2023-01-05: qty 20

## 2023-01-05 MED ORDER — FENTANYL CITRATE (PF) 100 MCG/2ML IJ SOLN
INTRAMUSCULAR | Status: DC | PRN
  Administered 2023-01-05 (×2): 25 ug via INTRAVENOUS
  Administered 2023-01-05: 50 ug via INTRAVENOUS

## 2023-01-05 SURGICAL SUPPLY — 29 items
APL SKNCLS STERI-STRIP NONHPOA (GAUZE/BANDAGES/DRESSINGS)
BAG DRAIN URO-CYSTO SKYTR STRL (DRAIN) ×1 IMPLANT
BAG DRN UROCATH (DRAIN) ×1
BASKET STONE 1.7 NGAGE (UROLOGICAL SUPPLIES) IMPLANT
BASKET ZERO TIP NITINOL 2.4FR (BASKET) ×1 IMPLANT
BENZOIN TINCTURE PRP APPL 2/3 (GAUZE/BANDAGES/DRESSINGS) IMPLANT
BSKT STON RTRVL ZERO TP 2.4FR (BASKET)
CATH URETL OPEN 5X70 (CATHETERS) IMPLANT
CLOTH BEACON ORANGE TIMEOUT ST (SAFETY) ×1 IMPLANT
GLOVE BIO SURGEON STRL SZ7.5 (GLOVE) ×1 IMPLANT
GLOVE SURG SS PI 7.5 STRL IVOR (GLOVE) IMPLANT
GOWN STRL REUS W/TWL LRG LVL3 (GOWN DISPOSABLE) IMPLANT
GOWN STRL REUS W/TWL XL LVL3 (GOWN DISPOSABLE) ×1 IMPLANT
GUIDEWIRE STR DUAL SENSOR (WIRE) IMPLANT
GUIDEWIRE ZIPWRE .038 STRAIGHT (WIRE) ×1 IMPLANT
IV NS IRRIG 3000ML ARTHROMATIC (IV SOLUTION) ×2 IMPLANT
KIT TURNOVER CYSTO (KITS) ×1 IMPLANT
LASER FIB FLEXIVA PULSE ID 365 (Laser) IMPLANT
MANIFOLD NEPTUNE II (INSTRUMENTS) ×1 IMPLANT
NS IRRIG 500ML POUR BTL (IV SOLUTION) ×1 IMPLANT
PACK CYSTO (CUSTOM PROCEDURE TRAY) ×1 IMPLANT
SLEEVE SCD COMPRESS KNEE MED (STOCKING) ×1 IMPLANT
STENT URET 6FRX24 CONTOUR (STENTS) IMPLANT
STRIP CLOSURE SKIN 1/2X4 (GAUZE/BANDAGES/DRESSINGS) IMPLANT
SYR 10ML LL (SYRINGE) ×1 IMPLANT
TRACTIP FLEXIVA PULS ID 200XHI (Laser) IMPLANT
TRACTIP FLEXIVA PULSE ID 200 (Laser) ×1
TUBE CONNECTING 12X1/4 (SUCTIONS) IMPLANT
TUBING UROLOGY SET (TUBING) ×1 IMPLANT

## 2023-01-05 NOTE — H&P (Signed)
Office Visit Report     01/03/2023   --------------------------------------------------------------------------------   Jordan Mayer  MRN: [id number]  DOB: 1970/10/05, 52 year old Female  PRIMARY CARE:  Berniece Andreas, MD  PRIMARY CARE FAX:  [phone]  REFERRING:    PROVIDER:  Bjorn Pippin, M.D.  TREATING:  Rhoderick Moody, M.D.  LOCATION:  [facility] [phone]     --------------------------------------------------------------------------------   CC/HPI: Left flank pain   Jordan Mayer is a 52 year old female, s/p left ureteral stent placement with Dr. Sherron Monday on 12/29/2022 due to an obstructing left ureteral stone and possible UTI. Blood and urine cultures at that time were negative.   The patient presents today with persistent nausea/emessis, left flank pain and OAB symptoms since her stents were placed. She has been able to hold down any solid food or medications for the past 48 hours despite Zofran. She denies fever/chills or gross hematuria.     ALLERGIES: Contrast Dye Penicillins    MEDICATIONS: Tamsulosin Hcl 0.4 mg capsule  Benlysta 120 mg vial  Effexor Xr  Emgality Syringe  Flonase 50 mcg/actuation spray, suspension Nasal  Fluconazole 150 mg tablet 1 Oral  [name] Fe  Hydrocodone-Acetaminophen 5 mg-325 mg tablet 1-2 tablet PO Q 6 H PRN  Hydrocodone-Acetaminophen 5 mg-325 mg tablet 1-2 tablet PO Q 6 H PRN  Phentermine Hcl 37.5 mg capsule Oral  Plaquenil  Spironolactone  Sulfamethoxazole  Trazodone Hcl 50 mg tablet  Zofran     GU PSH: No GU PSH      PSH Notes: Oral Surgery, Tonsillectomy, Foot Surgery   NON-GU PSH: Remove Tonsils - 2009     GU PMH: Stress Incontinence, Female stress incontinence - 2014 Urinary Urgency, Urinary urgency - 2014      PMH Notes:  1898-05-17 00:00:00 - Note: Normal Routine History And Physical Adult  2007-10-11 14:27:02 - Note: Urinary Frequency   NON-GU PMH: Anxiety, Anxiety - 2014    FAMILY  HISTORY: Family Health Status - Father alive at age 28 - Runs In Family Family Health Status - Mother's Age - Runs In Family Family Health Status Number - Runs In Family   SOCIAL HISTORY: No Social History     Notes: Caffeine Use, Marital History - Currently Married, Tobacco Use, Alcohol Use   REVIEW OF SYSTEMS:    GU Review Female:   Patient denies frequent urination, hard to postpone urination, burning /pain with urination, get up at night to urinate, leakage of urine, stream starts and stops, trouble starting your stream, have to strain to urinate, and being pregnant.  Gastrointestinal (Upper):   Patient denies nausea, vomiting, and indigestion/ heartburn.  Gastrointestinal (Lower):   Patient denies diarrhea and constipation.  Constitutional:   Patient denies fever, night sweats, weight loss, and fatigue.  Skin:   Patient denies skin rash/ lesion and itching.  Eyes:   Patient denies blurred vision and double vision.  Ears/ Nose/ Throat:   Patient denies sore throat and sinus problems.  Hematologic/Lymphatic:   Patient denies swollen glands and easy bruising.  Cardiovascular:   Patient denies leg swelling and chest pains.  Respiratory:   Patient denies cough and shortness of breath.  Endocrine:   Patient denies excessive thirst.  Musculoskeletal:   Patient denies back pain and joint pain.  Neurological:   Patient denies headaches and dizziness.  Psychologic:   Patient denies depression and anxiety.   VITAL SIGNS:      01/03/2023 09:46 AM  Weight 185 lb / 83.91 kg  Height 65 in / 165.1 cm  BP 127/77 mmHg  Pulse 116 /min  Temperature 98.0 F / 36.6 C  BMI 30.8 kg/m   MULTI-SYSTEM PHYSICAL EXAMINATION:    Constitutional: Well-nourished. No physical deformities. Normally developed. Good grooming.  Neurologic / Psychiatric: Oriented to time, oriented to place, oriented to person. No depression, no anxiety, no agitation.     Complexity of Data:  Records Review:   Previous Hospital  Records, Previous Patient Records  Urine Test Review:   Urine Culture  X-Ray Review: C.T. Abdomen/Pelvis: Reviewed Films. Reviewed Report. Discussed With Patient.    Notes:                     CLINICAL DATA: Abdominal/flank pain with stone suspected. Left  flank pain for 1 week   EXAM:  CT ABDOMEN AND PELVIS WITHOUT CONTRAST   TECHNIQUE:  Multidetector CT imaging of the abdomen and pelvis was performed  following the standard protocol without IV contrast.   RADIATION DOSE REDUCTION: This exam was performed according to the  departmental dose-optimization program which includes automated  exposure control, adjustment of the mA and/or kV according to  patient size and/or use of iterative reconstruction technique.   COMPARISON: 06/02/2021   FINDINGS:  Lower chest: No contributory findings.   Hepatobiliary: No focal liver abnormality.Cholecystectomy.   Pancreas: Unremarkable.   Spleen: Unremarkable.   Adrenals/Urinary Tract: Negative adrenals. 4 mm left UPJ calculus  causing slight hydronephrosis. No additional urolithiasis.  Unremarkable bladder.   Stomach/Bowel: No obstruction. No visible bowel inflammation.  Postoperative stomach.   Vascular/Lymphatic: No acute vascular abnormality. No mass or  adenopathy.   Reproductive:No pathologic findings.   Other: No ascites or pneumoperitoneum.   Musculoskeletal: No acute abnormalities.   IMPRESSION:  4 mm calculus just below the left UPJ causing minimal  hydronephrosis.    Electronically Signed  By: Tiburcio Pea M.D.  On: 12/29/2022 05:03     PROCEDURES:         KUB - 74018  A single view of the abdomen is obtained.      Patient confirmed No Neulasta OnPro Device.           Urinalysis w/Scope Dipstick Dipstick Cont'd Micro  Color: Red Bilirubin: Neg mg/dL WBC/hpf: 6 - 24/MWN  Appearance: Cloudy Ketones: Trace mg/dL RBC/hpf: >02/VOZ  Specific Gravity: <=1.005 Blood: 3+ ery/uL Bacteria: Mod (26-50/hpf)  pH:  6.0 Protein: 3+ mg/dL Cystals: NS (Not Seen)  Glucose: Neg mg/dL Urobilinogen: 1.0 mg/dL Casts: NS (Not Seen)    Nitrites: Positive Trichomonas: Not Present    Leukocyte Esterase: 2+ leu/uL Mucous: Not Present      Epithelial Cells: 0 - 5/hpf      Yeast: NS (Not Seen)      Sperm: Not Present    Notes: **QNS for spun micro**          Ketoralac 30mg  - [id number], [id number] 30 was given and 30 was wasted.   Qty: 60 Adm. By: Andree Moro  Unit: mg Lot No [id number]  Route: IM Exp. Date 02/15/2024  Freq: None Mfgr.:   Site: Left Hip         Phenergan 25mg  - [id number], [id number] 12.5 mg was given and 12.5 was wasted.   Qty: 25 Adm. By: Andree Moro  Unit: mg Lot No [id number]  Route: IM Exp. Date 06/18/2023  Freq: None Mfgr.:   Site: Right Hip   ASSESSMENT:      ICD-10 Details  1 GU:  Ureteral calculus - N20.1 Left, Undiagnosed New Problem  2   Flank Pain - R10.84 Left, Undiagnosed New Problem   PLAN:            Medications New Meds: Cephalexin 500 mg capsule 1 capsule PO BID   #14  0 Refill(s)  Cipro 500 mg tablet 1 tablet PO BID   #14  0 Refill(s)  Ketorolac Tromethamine 10 mg tablet 1 tablet PO Q 6 H PRN   #20  0 Refill(s)  Oxybutynin Chloride 5 mg tablet 1 tablet PO Q 8 H PRN Take as needed for bladder spasms  #30  1 Refill(s)  Promethazine Hcl 12.5 mg suppository, rectal 1 suppository PR Q 4 H PRN   #30  1 Refill(s)  Pyridium 200 mg tablet 1 tablet PO Q 8 H PRN Take as needed for pain with urination  #30  1 Refill(s)  Pharmacy Name:  [id number]  Address:  [address]   [place], [place] [address]  Phone:  [phone]  Fax:  [phone]            Orders Labs Urine Culture          Schedule Return Visit/Planned Activity: Next Available Appointment - Schedule Surgery          Document Letter(s):  Created for Patient: Clinical Summary         Notes:   The risks, benefits and alternatives of cystoscopy with LEFT ureteroscopy, laser lithotripsy  and ureteral stent placement was discussed the patient. Risks included, but are not limited to: bleeding, urinary tract infection, ureteral injury/avulsion, ureteral stricture formation, retained stone fragments, the possibility that multiple surgeries may be required to treat the stone(s), MI, stroke, PE and the inherent risks of general anesthesia. The patient voices understanding and wishes to proceed.

## 2023-01-05 NOTE — Op Note (Signed)
Operative Note  Preoperative diagnosis:  1.  4 mm left UPJ stone  Postoperative diagnosis: 1.  4 mm left UPJ stone  Procedure(s): 1.  Cystoscopy with left ureteroscopy, holmium laser and left ureteral stent exchange  Surgeon: Rhoderick Moody, MD  Assistants:  None  Anesthesia:  General  Complications:  None  EBL: Less than 5 mL  Specimens: 1.  Previously placed left ureteral stent was removed intact, inspected and discarded  Drains/Catheters: 1.  Left 6 French, 24 cm JJ stent with tether  Intraoperative findings:   Her left UPJ stone migrated into a midpole calyx and was dusted into less than 2 mm fragments No evidence of ureteral trauma or edema  Indication:  EPPIE WALPOLE is a 52 y.o. female with 4 mm left UPJ stone, s/p left ureteral stent placement on 12/29/2022.  The patient struggled with renal colic following her initial stent placement.  She is here today for definitive stone treatment.  She has been consented for the above procedures, voices understanding and wishes to proceed.  Description of procedure:  After informed consent was obtained, the patient was brought to the operating room and general LMA anesthesia was administered. The patient was then placed in the dorsolithotomy position and prepped and draped in the usual sterile fashion. A timeout was performed. A 23 French rigid cystoscope was then inserted into the urethral meatus and advanced into the bladder under direct vision. A complete bladder survey revealed no intravesical pathology.  Her previously placed left ureteral stent grasped his distal curl and retracted to the urethral meatus.  A Glidewire was then advanced through the lumen of the stent up to the left renal pelvis under fluoroscopic guidance.  The previously placed stent was then removed intact, inspected and discarded.  A flexible ureteroscope was then advanced up the left ureter and her UPJ stone was found to have migrated into a midpole  calyx.  A 200 m holmium laser was then used to dust the stone into less than 2 mm fragments.  No additional stone burden or upper tract abnormalities were seen.  The flexible ureteroscope was then removed under direct vision, identifying no additional luminal stone burden within the ureter or signs of ureteral trauma.  A new 6 [place], 24 cm JJ stent was then advanced over the wire and into good position within the left collecting system, confirming placement via fluoroscopy.  The tether the stent was left intact.  The patient's bladder was drained.  The tether the stent was tucked into the vaginal vault.  She tolerated the procedure well and was transferred to the postanesthesia in stable condition.  Plan: The patient has been instructed to remove her ureteral stent at 7 AM on 01/07/2023.  Follow-up in 6 weeks for renal ultrasound.

## 2023-01-05 NOTE — Discharge Instructions (Addendum)
Post Anesthesia Home Care Instructions  Activity: Get plenty of rest for the remainder of the day. A responsible individual must stay with you for 24 hours following the procedure.  For the next 24 hours, DO NOT: -Drive a car -Advertising copywriter -Drink alcoholic beverages -Take any medication unless instructed by your physician -Make any legal decisions or sign important papers.  Meals: Start with liquid foods such as gelatin or soup. Progress to regular foods as tolerated. Avoid greasy, spicy, heavy foods. If nausea and/or vomiting occur, drink only clear liquids until the nausea and/or vomiting subsides. Call your physician if vomiting continues.  Special Instructions/Symptoms: Your throat may feel dry or sore from the anesthesia or the breathing tube placed in your throat during surgery. If this causes discomfort, gargle with warm salt water. The discomfort should disappear within 24 hours.  [facility] [phone] Post Ureteroscopy With Stent Instructions  Definitions:  Ureter: The duct that transports urine from the kidney to the bladder. Stent:   A plastic hollow tube that is placed into the ureter, from the kidney to the bladder to prevent the ureter from swelling shut.  GENERAL INSTRUCTIONS:  Despite the fact that no skin incisions were used, the area around the ureter and bladder is raw and irritated. The stent is a foreign body which will further irritate the bladder wall. This irritation is manifested by increased frequency of urination, both day and night, and by an increase in the urge to urinate. In some, the urge to urinate is present almost always. Sometimes the urge is strong enough that you may not be able to stop yourself from urinating. The only real cure is to remove the stent and then give time for the bladder wall to heal which can't be done until the danger of the ureter swelling shut has passed, which varies.  You may see some blood in your  urine while the stent is in place and a few days afterwards. Do not be alarmed, even if the urine was clear for a while. Get off your feet and drink lots of fluids until clearing occurs. If you start to pass clots or don't improve, call us.  DIET: You may return to your normal diet immediately. Because of the raw surface of your bladder, alcohol, spicy foods, acid type foods and drinks with caffeine may cause irritation or frequency and should be used in moderation. To keep your urine flowing freely and to avoid constipation, drink plenty of fluids during the day ( 8-10 glasses ). Tip: Avoid cranberry juice because it is very acidic.  ACTIVITY: Your physical activity doesn't need to be restricted. However, if you are very active, you may see some blood in your urine. We suggest that you reduce your activity under these circumstances until the bleeding has stopped.  BOWELS: It is important to keep your bowels regular during the postoperative period. Straining with bowel movements can cause bleeding. A bowel movement every other day is reasonable. Use a mild laxative if needed, such as Milk of Magnesia 2-3 tablespoons, or 2 Dulcolax tablets. Call if you continue to have problems. If you have been taking narcotics for pain, before, during or after your surgery, you may be constipated. Take a laxative if necessary.   MEDICATION: You should resume your pre-surgery medications unless told not to. In addition you will often be given an antibiotic to prevent infection. These should be taken as prescribed until the bottles are finished unless you are having an unusual reaction  to one of the drugs.  PROBLEMS YOU SHOULD REPORT TO [place]: Fevers over 100.5 Fahrenheit. Heavy bleeding, or clots ( See above notes about blood in urine ). Inability to urinate. Drug reactions ( hives, rash, nausea, vomiting, diarrhea ). Severe burning or pain with urination that is not improving.  FOLLOW-UP: You will need a  follow-up appointment to monitor your progress. Call for this appointment at the number listed above. Usually the first appointment will be about three to fourteen days after your surgery.  May take Tylenol as needed for soreness/discomfort beginning at 5:30 PM 01/05/2023.

## 2023-01-05 NOTE — Anesthesia Procedure Notes (Signed)
Procedure Name: LMA Insertion Date/Time: 01/05/2023 1:05 PM  Performed by: Jessica Priest, CRNAPre-anesthesia Checklist: Patient identified, Emergency Drugs available, Suction available, Patient being monitored and Timeout performed Patient Re-evaluated:Patient Re-evaluated prior to induction Oxygen Delivery Method: Circle system utilized Preoxygenation: Pre-oxygenation with 100% oxygen Induction Type: IV induction Ventilation: Mask ventilation without difficulty LMA: LMA inserted LMA Size: 4.0 Number of attempts: 1 Airway Equipment and Method: Bite block Placement Confirmation: positive ETCO2, breath sounds checked- equal and bilateral and CO2 detector Tube secured with: Tape Dental Injury: Teeth and Oropharynx as per pre-operative assessment

## 2023-01-05 NOTE — Transfer of Care (Signed)
Immediate Anesthesia Transfer of Care Note  Patient: Jordan Mayer  Procedure(s) Performed: Procedure(s) (LRB): CYSTOSCOPY, LEFT URETEROSCOPY, HOLMIUM LASER LITHOTRIPSY, AND LEFT URETERAL STENT PLACEMENT (Left)  Patient Location: PACU  Anesthesia Type: General  Level of Consciousness: awake, sedated, patient cooperative and responds to stimulation  Airway & Oxygen Therapy: Patient Spontanous Breathing and Patient connected to [place] oxygen  Post-op Assessment: Report given to PACU RN, Post -op Vital signs reviewed and stable and Patient moving all extremities  Post vital signs: Reviewed and stable  Complications: No apparent anesthesia complications

## 2023-01-05 NOTE — Anesthesia Preprocedure Evaluation (Addendum)
Anesthesia Evaluation  Patient identified by MRN, date of birth, ID band Patient awake    Reviewed: Allergy & Precautions, NPO status , Patient's Chart, lab work & pertinent test results  Airway Mallampati: II  TM Distance: >3 FB Neck ROM: Full    Dental no notable dental hx.    Pulmonary asthma    Pulmonary exam normal        Cardiovascular negative cardio ROS Normal cardiovascular exam     Neuro/Psych  Headaches PSYCHIATRIC DISORDERS Anxiety Depression       GI/Hepatic negative GI ROS, Neg liver ROS,,,  Endo/Other  PCOS (polycystic ovarian syndrome)  Renal/GU Renal disease     Musculoskeletal negative musculoskeletal ROS (+)    Abdominal  (+) + obese  Peds  Hematology negative hematology ROS (+)   Anesthesia Other Findings LEFT URETERAL STONE  Reproductive/Obstetrics                             Anesthesia Physical Anesthesia Plan  ASA: 2  Anesthesia Plan: General   Post-op Pain Management:    Induction: Intravenous  PONV Risk Score and Plan: 4 or greater and Ondansetron, Dexamethasone, Midazolam and Treatment may vary due to age or medical condition  Airway Management Planned: LMA  Additional Equipment:   Intra-op Plan:   Post-operative Plan: Extubation in OR  Informed Consent: I have reviewed the patients History and Physical, chart, labs and discussed the procedure including the risks, benefits and alternatives for the proposed anesthesia with the patient or authorized representative who has indicated his/her understanding and acceptance.     Dental advisory given  Plan Discussed with: CRNA  Anesthesia Plan Comments:        Anesthesia Quick Evaluation

## 2023-01-06 ENCOUNTER — Encounter (HOSPITAL_BASED_OUTPATIENT_CLINIC_OR_DEPARTMENT_OTHER): Payer: Self-pay | Admitting: Urology

## 2023-01-06 NOTE — Anesthesia Postprocedure Evaluation (Signed)
Anesthesia Post Note  Patient: SUSZANNE BRAKEFIELD  Procedure(s) Performed: CYSTOSCOPY, LEFT URETEROSCOPY, HOLMIUM LASER LITHOTRIPSY, AND LEFT URETERAL STENT PLACEMENT (Left)     Patient location during evaluation: PACU Anesthesia Type: General Level of consciousness: awake Pain management: pain level controlled Vital Signs Assessment: post-procedure vital signs reviewed and stable Respiratory status: spontaneous breathing, nonlabored ventilation and respiratory function stable Cardiovascular status: blood pressure returned to baseline and stable Postop Assessment: no apparent nausea or vomiting Anesthetic complications: no   No notable events documented.  Last Vitals:  Vitals:   01/05/23 1430 01/05/23 1455  BP: 114/76 (!) 117/90  Pulse: 70 62  Resp: (!) 26 17  Temp: 36.8 C   SpO2: 97% 99%    Last Pain:  Vitals:   01/05/23 1455  TempSrc:   PainSc: 0-No pain                 Dez Stauffer P Deyona Soza

## 2023-01-18 ENCOUNTER — Telehealth: Payer: Self-pay | Admitting: Internal Medicine

## 2023-01-18 NOTE — Telephone Encounter (Signed)
Pt need post hospital  fup this week . Pt had kidney stones and sepsis

## 2023-01-19 NOTE — Telephone Encounter (Signed)
Ok to use Tuesday afternoon sept 10 and make sure she will be following up  with urologist

## 2023-01-19 NOTE — Telephone Encounter (Signed)
Provider next available for hospital f/u is 02/02/23, says she cannot wait that long thinks she may still be septic. Pls advise

## 2023-01-19 NOTE — Telephone Encounter (Signed)
Attempted to reach pt to schedule an appt. Left a voicemail to call us back.  When pt call back, can schedule her an appt for hospital f/u. Thanks!

## 2023-01-19 NOTE — Telephone Encounter (Signed)
Spoke to pt.   She reports she will have appt with urology on 02/14/2023.   F/u appt scheduled.

## 2023-01-25 ENCOUNTER — Encounter: Payer: Self-pay | Admitting: Internal Medicine

## 2023-01-25 ENCOUNTER — Ambulatory Visit (INDEPENDENT_AMBULATORY_CARE_PROVIDER_SITE_OTHER): Payer: 59 | Admitting: Internal Medicine

## 2023-01-25 VITALS — BP 92/60 | HR 93 | Temp 98.5°F | Ht 65.0 in | Wt 188.2 lb

## 2023-01-25 DIAGNOSIS — R5383 Other fatigue: Secondary | ICD-10-CM

## 2023-01-25 DIAGNOSIS — N200 Calculus of kidney: Secondary | ICD-10-CM

## 2023-01-25 DIAGNOSIS — R197 Diarrhea, unspecified: Secondary | ICD-10-CM | POA: Diagnosis not present

## 2023-01-25 DIAGNOSIS — M329 Systemic lupus erythematosus, unspecified: Secondary | ICD-10-CM

## 2023-01-25 DIAGNOSIS — Z79899 Other long term (current) drug therapy: Secondary | ICD-10-CM

## 2023-01-25 DIAGNOSIS — Z87442 Personal history of urinary calculi: Secondary | ICD-10-CM

## 2023-01-25 DIAGNOSIS — IMO0002 Reserved for concepts with insufficient information to code with codable children: Secondary | ICD-10-CM

## 2023-01-25 DIAGNOSIS — M328 Other forms of systemic lupus erythematosus: Secondary | ICD-10-CM | POA: Diagnosis not present

## 2023-01-25 DIAGNOSIS — Z9884 Bariatric surgery status: Secondary | ICD-10-CM

## 2023-01-25 DIAGNOSIS — Z09 Encounter for follow-up examination after completed treatment for conditions other than malignant neoplasm: Secondary | ICD-10-CM

## 2023-01-25 NOTE — Patient Instructions (Signed)
Lab today and ty to get stool test( collection)  And then go from there  I may need you to see rumatology alos ( Hip bursitis flare etc)

## 2023-01-25 NOTE — Progress Notes (Signed)
Chief Complaint  Patient presents with   Hospitalization Follow-up    Pt reports he was in hospital for kidney stone,bladder infection, 2 stent surgeries on bladder. Feeling nausea, ear pain, diarrhea, feels feverish.     HPI: Jordan Mayer 52 y.o. come in for fu hosp admission and obstructive renal stone left  and fractured  in hospital  . Says she had sepsis  but  still feels badly and not back to baseline . Stent is out  an removed.  Zofran and cipro  at dc  Was on cipro  for about 10 days and  now off for 1 week.   Hard  nausea and more fatigue and ear aches   and stiff neck    and bursitis. Taking zofran  Then had watery diarrhea bout 4 x per day   none today but abd cramps relieved by defecation no blood but tender abdomen . Not eating much   Has "lupus" or ct dissorder  on plaquenil and infusions Benlysta , spironolactone for pcos  no visit recently Dr Dierdre Forth x for infusions  ROS: See pertinent positives and negatives per HPI. No fever but feels feverish .  No acute joint swelling but  pain bilateral .  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    Shingles     Family History  Problem Relation Age of Onset   Varicose Veins Mother    Asthma Mother    Myasthenia gravis Mother        Ocular   Varicose Veins Sister    Arthritis Maternal Grandmother    Arthritis Paternal Grandfather    Hearing loss Paternal Grandfather    Healthy Son     Healthy Son    Birth defects Paternal Aunt        Cognitively Challenged   Miscarriages / Stillbirths Paternal Aunt    Varicose Veins Paternal Aunt    Varicose Veins Paternal Aunt    Melanoma Paternal Aunt        melanoma x2   Prostate cancer Cousin    Colon cancer Neg Hx    Colon polyps Neg Hx    Esophageal cancer Neg Hx    Rectal cancer Neg Hx    Stomach cancer Neg Hx     Social History   Socioeconomic History   Marital status: Married    Spouse name: Casimiro Needle   Number of children: 2   Years of education: Not on file   Highest education level: Master's degree (e.g., MA, MS, MEng, MEd, MSW, MBA)  Occupational History   Occupation: Teaching laboratory technician  Tobacco Use   Smoking status: Never    Passive exposure: Never   Smokeless tobacco: Never  Vaping Use   Vaping status: Never  Used  Substance and Sexual Activity   Alcohol use: Not Currently   Drug use: No   Sexual activity: Yes    Birth control/protection: None    Comment: husband had vasectomy  Other Topics Concern   Not on file  Social History Narrative   7 hours of sleep per night   Lives with her husband and 2 kids   Does not work/Full Time Volunteer   Has a dog in the home   Caffeine- coffee 1 c but not daily   Social Determinants of Health   Financial Resource Strain: Not on file  Food Insecurity: No Food Insecurity (12/29/2022)   Hunger Vital Sign    Worried About Running Out of Food in the Last Year: Never true    Ran Out of Food in the Last Year: Never true  Transportation Needs: No Transportation Needs (12/29/2022)   PRAPARE - Administrator, Civil Service (Medical): No    Lack of Transportation (Non-Medical): No  Physical Activity: Not on file  Stress: Not on file  Social Connections: Unknown (01/22/2022)   [facility]   Social Network    Social Network: Not on file    Outpatient Medications Prior to Visit  Medication Sig Dispense Refill   belimumab  (BENLYSTA) 400 MG SOLR injection Inject 10 mg/kg into the vein every 30 (thirty) days. Q month iv     Cholecalciferol (D3 PO) Take by mouth. 2000 mg daily     EPINEPHrine 0.3 mg/0.3 mL IJ SOAJ injection      Galcanezumab-gnlm (EMGALITY) 120 MG/ML SOAJ ADMINISTER 1 ML UNDER THE SKIN EVERY 30 DAYS 1 mL 0   [name] 24 FE 1-20 MG-MCG(24) tablet Take 1 tablet by mouth daily.     hydroxychloroquine (PLAQUENIL) 200 MG tablet Take 200mg  by mouth twice daily, Monday through Friday only. None on Saturday or Sunday. 120 tablet 0   Multiple Vitamin (MULTIVITAMIN) tablet Take 1 tablet by mouth daily.     ondansetron (ZOFRAN) 4 MG tablet Take 1 tablet (4 mg total) by mouth daily as needed for nausea or vomiting. 30 tablet 1   spironolactone (ALDACTONE) 100 MG tablet Take 100 mg by mouth daily.     traZODone (DESYREL) 100 MG tablet Take 2 tablets (200 mg total) by mouth at bedtime.     UBRELVY 100 MG TABS Take by mouth as needed.     venlafaxine (EFFEXOR) 37.5 MG tablet Take 1 tablet (37.5 mg total) by mouth 2 (two) times daily.     ciprofloxacin (CIPRO) 500 MG tablet Take 500 mg by mouth 2 (two) times daily.     ketorolac (TORADOL) 10 MG tablet Take 10 mg by mouth every 6 (six) hours as needed.     oxyCODONE-acetaminophen (PERCOCET/ROXICET) 5-325 MG tablet Take 1 tablet by mouth every 6 (six) hours as needed for severe pain. 15 tablet 0   tamsulosin (FLOMAX) 0.4 MG CAPS capsule Take 1 capsule (0.4 mg total) by mouth daily. 30 capsule 0   No facility-administered medications prior to visit.     EXAM:  BP 92/60 (BP Location: Left Arm, Patient Position: Sitting, Cuff Size: Large)   Pulse 93   Temp 98.5 F (36.9 C) (Oral)   Ht 5\' 5"  (1.651 m)   Wt 188 lb 3.2 oz (85.4 kg)   LMP 12/15/2022 (Exact Date)   SpO2 98%   BMI 31.32 kg/m   Body mass index is 31.32 kg/m.  GENERAL: vitals reviewed and  listed above, alert, oriented, appears well hydrated and in no acute distress tired and not feeling well  non  toxic  HEENT: atraumatic, conjunctiva  clear, no obvious abnormalities on inspection of external nose and ears  no adenopathy but says glands have been tender  OP : no lesion edema or exudate  no inc redness  NECK: no obvious masses on inspection palpation  LUNGS: clear to auscultation bilaterally, no wheezes, rales or rhonchi, good air movement CV: HRRR, no clubbing cyanosis or  peripheral edema nl cap refill  Abdomen:  Sof,t normal bowel sounds without hepatosplenomegaly, no guarding rebound or masses no CVA tenderness tender left abd gutter area  no masses  MS: moves all extremities without noticeable acute  focal  abnormality PSYCH: pleasant and cooperative,  Lab Results  Component Value Date   WBC 5.3 12/30/2022   HGB 12.0 12/30/2022   HCT 37.8 12/30/2022   PLT 177 12/30/2022   GLUCOSE 89 12/30/2022   CHOL 187 06/12/2020   TRIG 115 06/02/2021   HDL 46.60 06/12/2020   LDLCALC 102 (H) 06/12/2020   ALT 12 08/16/2022   AST 11 08/16/2022   NA 138 12/30/2022   K 3.7 12/30/2022   CL 109 12/30/2022   CREATININE 1.06 (H) 12/30/2022   BUN 8 12/30/2022   CO2 24 12/30/2022   TSH 2.478 12/30/2022   HGBA1C 5.7 (A) 08/25/2022   BP Readings from Last 3 Encounters:  01/25/23 92/60  01/05/23 (!) 117/90  12/30/22 136/83   Record  revewi  ASSESSMENT AND PLAN:  Discussed the following assessment and plan:  Other fatigue - Plan: Basic metabolic panel, CBC with Differential/Platelet, Hepatic function panel, C-reactive protein, C. difficile GDH and Toxin A/B, Urinalysis, Routine w reflex microscopic  Diarrhea, unspecified type - Plan: Basic metabolic panel, CBC with Differential/Platelet, Hepatic function panel, C-reactive protein, C. difficile GDH and Toxin A/B, Urinalysis, Routine w reflex microscopic  Lupus (HCC) - uncertain which  sytems involvement at visit today  on high risk medications  Other forms of systemic lupus erythematosus, unspecified organ involvement status (HCC) - Plan:  Basic metabolic panel, CBC with Differential/Platelet, Hepatic function panel, C-reactive protein, C. difficile GDH and Toxin A/B, Urinalysis, Routine w reflex microscopic  History of renal stone - Plan: Basic metabolic panel, CBC with Differential/Platelet, Hepatic function panel, C-reactive protein, C. difficile GDH and Toxin A/B, Urinalysis, Routine w reflex microscopic  Nephrolithiasis  H/O bariatric surgery  High risk medication use  Hospital discharge follow-up Concern about se of antibiotics   check for c diff although  diarrhea is improving she still has  abd tenderness  left side  Hard to tell if sx left over from illness  and will take time to improve  vx  glar of some underlying process  with bilateral hip area  issue like bursitis  May have her  assess by rheum  -Patient advised to return or notify health care team  if  new concerns arise.  Patient Instructions  Lab today and ty to get stool test( collection)  And then go from there  I may need you to see rumatology alos ( Hip bursitis flare etc)   Neta Mends. Alexandre Faries M.D.

## 2023-01-26 LAB — URINALYSIS, ROUTINE W REFLEX MICROSCOPIC
Hgb urine dipstick: NEGATIVE
Leukocytes,Ua: NEGATIVE
Nitrite: NEGATIVE
RBC / HPF: NONE SEEN (ref 0–?)
Specific Gravity, Urine: >=1.03 — AB (ref 1.000–1.030)
Total Protein, Urine: NEGATIVE
Urine Glucose: NEGATIVE
Urobilinogen, UA: 0.2 (ref 0.0–1.0)
pH: 6 (ref 5.0–8.0)

## 2023-01-26 LAB — BASIC METABOLIC PANEL
BUN: 12 mg/dL (ref 6–23)
CO2: 24 meq/L (ref 19–32)
Calcium: 8.9 mg/dL (ref 8.4–10.5)
Chloride: 110 meq/L (ref 96–112)
Creatinine, Ser: 0.97 mg/dL (ref 0.40–1.20)
GFR: 67.18 mL/min (ref >60.00)
Glucose, Bld: 90 mg/dL (ref 70–99)
Potassium: 3.8 meq/L (ref 3.5–5.1)
Sodium: 140 meq/L (ref 135–145)

## 2023-01-26 LAB — CBC WITH DIFFERENTIAL/PLATELET
Basophils Absolute: 0 10*3/uL (ref 0.0–0.1)
Basophils Relative: 0.5 % (ref 0.0–3.0)
Eosinophils Absolute: 0.1 10*3/uL (ref 0.0–0.7)
Eosinophils Relative: 1.3 % (ref 0.0–5.0)
HCT: 39.4 % (ref 36.0–46.0)
Hemoglobin: 12.8 g/dL (ref 12.0–15.0)
Lymphocytes Relative: 18.7 % (ref 12.0–46.0)
Lymphs Abs: 1 10*3/uL (ref 0.7–4.0)
MCHC: 32.6 g/dL (ref 30.0–36.0)
MCV: 85.4 fl (ref 78.0–100.0)
Monocytes Absolute: 0.7 10*3/uL (ref 0.1–1.0)
Monocytes Relative: 12.9 % — ABNORMAL HIGH (ref 3.0–12.0)
Neutro Abs: 3.6 10*3/uL (ref 1.4–7.7)
Neutrophils Relative %: 66.6 % (ref 43.0–77.0)
Platelets: 250 10*3/uL (ref 150.0–400.0)
RBC: 4.61 Mil/uL (ref 3.87–5.11)
RDW: 13.9 % (ref 11.5–15.5)
WBC: 5.5 10*3/uL (ref 4.0–10.5)

## 2023-01-26 LAB — HEPATIC FUNCTION PANEL
ALT: 19 U/L (ref 0–35)
AST: 17 U/L (ref 0–37)
Albumin: 3.6 g/dL (ref 3.5–5.2)
Alkaline Phosphatase: 54 U/L (ref 39–117)
Bilirubin, Direct: 0.1 mg/dL (ref 0.0–0.3)
Total Bilirubin: 0.4 mg/dL (ref 0.2–1.2)
Total Protein: 6.5 g/dL (ref 6.0–8.3)

## 2023-01-26 LAB — C-REACTIVE PROTEIN: CRP: <1 mg/dL (ref 0.5–20.0)

## 2023-01-31 LAB — C. DIFFICILE GDH AND TOXIN A/B
GDH ANTIGEN: NOT DETECTED
MICRO NUMBER:: 15464162
SPECIMEN QUALITY:: ADEQUATE
TOXIN A AND B: NOT DETECTED

## 2023-01-31 NOTE — Progress Notes (Signed)
Stool negative for c diff  and rest of labs improving . And I saw that the ct abd is better  .  This is reassuring . If the gI symptoms do not  improve in the next 2 weeks or worse we may want the GI team to see you.  It is possible that the stone issues and infection may have given you a flair of symptoms that are taking longer to resolve. Consider seeing  rheumatology fu if  pain  fatigue  do not improve  also in next few weeks .  Can make a fu visit with me in next month unless feeling better.

## 2023-02-01 ENCOUNTER — Encounter: Payer: Self-pay | Admitting: Adult Health

## 2023-02-01 ENCOUNTER — Telehealth (INDEPENDENT_AMBULATORY_CARE_PROVIDER_SITE_OTHER): Payer: 59 | Admitting: Adult Health

## 2023-02-01 ENCOUNTER — Telehealth: Payer: Self-pay | Admitting: Adult Health

## 2023-02-01 DIAGNOSIS — G43009 Migraine without aura, not intractable, without status migrainosus: Secondary | ICD-10-CM

## 2023-02-01 DIAGNOSIS — R42 Dizziness and giddiness: Secondary | ICD-10-CM | POA: Diagnosis not present

## 2023-02-01 DIAGNOSIS — M542 Cervicalgia: Secondary | ICD-10-CM

## 2023-02-01 DIAGNOSIS — H538 Other visual disturbances: Secondary | ICD-10-CM | POA: Diagnosis not present

## 2023-02-01 DIAGNOSIS — M5412 Radiculopathy, cervical region: Secondary | ICD-10-CM | POA: Diagnosis not present

## 2023-02-01 MED ORDER — ZOLMITRIPTAN 5 MG NA SOLN
1.0000 | NASAL | 11 refills | Status: DC | PRN
Start: 2023-02-01 — End: 2023-02-07

## 2023-02-01 NOTE — Progress Notes (Signed)
Primary neurologist: Dr. Delena Bali   CC:  headaches  History provided from self  Virtual Visit via Video Note  I connected with Jordan Mayer on 02/01/23 at 10:45 AM EDT by a video enabled telemedicine application and verified that I am speaking with the correct person using two identifiers.  Location: Patient: at home Provider: in office   I discussed the limitations of evaluation and management by telemedicine and the availability of in person appointments. The patient expressed understanding and agreed to proceed.     Follow-up visit:  Prior visit: 06/22/2022  Brief HPI:   Jordan Mayer is a 52 y.o. female who is being seen for follow-up regarding worsening migraine headaches reporting migraines every day at initial visit on 01/04/2022 as well as complaints of intermittent blurred vision. MR brain unremarkable.  At prior visit, continued on Emgality for preventative and started Ubrelvy for rescue as no benefit with Nurtec. C/o vertigo, she questioned if due to underlying connective tissue disorder and recently starting Plaquenil, planned on further discussing with rheumatology, no further work up completed as recent MRI brain normal and neuro exam intact.     Interval history:  Continues on Emgality monthly injection, does have some increased migraines 2-3 days prior to next injection. Unable to say exact frequency of migraine headaches as these can fluctuate from month to month especially with changes in weather, usually has migraine with changes in barometric pressure. Use of Ubrelvy, will take edge off, but does not touch the migraines associated with barometric pressure changes.  Continues to have vertigo at least 4-5x per week, present at least over the past year, can last anywhere from 1 min up to hours. When they last hours, they are debilitating and needs to stay in bed. Symptoms worse with eyes closed. No clear trigger, can occur while sitting still or up and  moving. Does not occur with quick position changes and only a occasionally symptoms present with head movement towards the left. Not associated with headaches.  Denies hearing loss or tinnitus.  Does have chronic neck pain, occasionally radiating down right arm. Had cervical imaging done "years ago" and told C5-6 pinched nerve.  Denies vision changes associated with vertigo but reports overall decline in vision, vision can fluctuate, has difficulty reading small print at times, reports 3-4 episodes of diplopia over the past year, denies actual vision loss, was seen by Dr. Dione Booze recently without clear cause.         Headache History: Onset: several years Triggers: changes in weather Aura: blurry Location: occipital, temples Quality/Description: squeezing, stabbing, throbbing, pressure Associated Symptoms:  Photophobia: yes  Phonophobia: yes  Nausea: yes, takes Zofran as needed Worse with activity?: yes Duration of headaches: 24 hours    Current Treatment: Abortive Ubrelvy   Preventative Emgality  Prior Therapies                                 Tylenol Imitrex 50-100 mg PRN - side effects Maxalt 10 mg PRN - side effects Nurtec  Effexor Topamax - lack of efficacy, kidney stones Neck PT     LABS: CBC    Component Value Date/Time   WBC 5.5 01/25/2023 1622   RBC 4.61 01/25/2023 1622   HGB 12.8 01/25/2023 1622   HCT 39.4 01/25/2023 1622   PLT 250.0 01/25/2023 1622   MCV 85.4 01/25/2023 1622   MCH 28.0 12/30/2022 0452   MCHC  32.6 01/25/2023 1622   RDW 13.9 01/25/2023 1622   LYMPHSABS 1.0 01/25/2023 1622   MONOABS 0.7 01/25/2023 1622   EOSABS 0.1 01/25/2023 1622   BASOSABS 0.0 01/25/2023 1622      Latest Ref Rng & Units 01/25/2023    4:22 PM 12/30/2022    4:52 AM 12/29/2022    4:25 AM  CMP  Glucose 70 - 99 mg/dL 90  89  161   BUN 6 - 23 mg/dL 12  8  14    Creatinine 0.40 - 1.20 mg/dL 0.96  0.45  4.09   Sodium 135 - 145 mEq/L 140  138  140   Potassium 3.5 - 5.1  mEq/L 3.8  3.7  3.7   Chloride 96 - 112 mEq/L 110  109  106   CO2 19 - 32 mEq/L 24  24  21    Calcium 8.4 - 10.5 mg/dL 8.9  8.4  9.2   Total Protein 6.0 - 8.3 g/dL 6.5     Total Bilirubin 0.2 - 1.2 mg/dL 0.4     Alkaline Phos 39 - 117 U/L 54     AST 0 - 37 U/L 17     ALT 0 - 35 U/L 19        IMAGING:  [facility] 11/12/21: partially empty sella, otherwise unremarkable  Imaging independently reviewed on February 01, 2023   Current Outpatient Medications on File Prior to Visit  Medication Sig Dispense Refill   belimumab (BENLYSTA) 400 MG SOLR injection Inject 10 mg/kg into the vein every 30 (thirty) days. Q month iv     Cholecalciferol (D3 PO) Take by mouth. 2000 mg daily     EPINEPHrine 0.3 mg/0.3 mL IJ SOAJ injection      Galcanezumab-gnlm (EMGALITY) 120 MG/ML SOAJ ADMINISTER 1 ML UNDER THE SKIN EVERY 30 DAYS 1 mL 0   [name] 24 FE 1-20 MG-MCG(24) tablet Take 1 tablet by mouth daily.     hydroxychloroquine (PLAQUENIL) 200 MG tablet Take 200mg  by mouth twice daily, Monday through Friday only. None on Saturday or Sunday. 120 tablet 0   Multiple Vitamin (MULTIVITAMIN) tablet Take 1 tablet by mouth daily.     ondansetron (ZOFRAN) 4 MG tablet Take 1 tablet (4 mg total) by mouth daily as needed for nausea or vomiting. 30 tablet 1   spironolactone (ALDACTONE) 100 MG tablet Take 100 mg by mouth daily.     traZODone (DESYREL) 100 MG tablet Take 2 tablets (200 mg total) by mouth at bedtime.     UBRELVY 100 MG TABS Take by mouth as needed.     venlafaxine (EFFEXOR) 37.5 MG tablet Take 1 tablet (37.5 mg total) by mouth 2 (two) times daily.     No current facility-administered medications on file prior to visit.     Allergies: Allergies  Allergen Reactions   Contrast Media [Iodinated Contrast Media] Hives   Iohexol      Code: HIVES, Desc: 1 hive developed on hand post injection of 125cc's Omni 300., Onset Date: [id number]    Penicillins Other (See Comments)    Has patient had a PCN reaction  causing immediate rash, facial/tongue/throat swelling, SOB or lightheadedness with hypotension: Yes Has patient had a PCN reaction causing severe rash involving mucus membranes or skin necrosis: No Has patient had a PCN reaction that required hospitalization: No Has patient had a PCN reaction occurring within the last 10 years: No If all of the above answers are "NO", then may proceed with Cephalosporin use.  Gadolinium Derivatives Hives, Itching and Rash    Family History: Family History  Problem Relation Age of Onset   Varicose Veins Mother    Asthma Mother    Myasthenia gravis Mother        Ocular   Varicose Veins Sister    Arthritis Maternal Grandmother    Arthritis Paternal Grandfather    Hearing loss Paternal Grandfather    Healthy Son    Healthy Son    Birth defects Paternal Aunt        Cognitively Challenged   Miscarriages / Stillbirths Paternal Aunt    Varicose Veins Paternal Aunt    Varicose Veins Paternal Aunt    Melanoma Paternal Aunt        melanoma x2   Prostate cancer Cousin    Colon cancer Neg Hx    Colon polyps Neg Hx    Esophageal cancer Neg Hx    Rectal cancer Neg Hx    Stomach cancer Neg Hx     Past Medical History: Past Medical History:  Diagnosis Date   ADD (attention deficit disorder)    Allergy    Anxiety    Asthma    Chicken pox    as child   Chronic migraines    Concussion    x 2 no residual   Depression    Family history of melanoma    Gallstones    GERD (gastroesophageal reflux disease)    Kidney stones    Lupus (HCC)    PCOS (polycystic ovarian syndrome)    Pneumonia 2003   Renal disorder    kidney disease   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    Shingles     Past Surgical History Past Surgical History:  Procedure Laterality Date   BUNIONECTOMY Right    CHOLECYSTECTOMY     COLONOSCOPY     CYSTOSCOPY W/ URETERAL STENT PLACEMENT Left 12/29/2022   Procedure: CYSTOSCOPY WITH LEFT RETROGRADE PYELOGRAM/URETERAL STENT PLACEMENT;  Surgeon:  Alfredo Martinez, MD;  Location: [facility];  Service: Urology;  Laterality: Left;   CYSTOSCOPY/URETEROSCOPY/HOLMIUM LASER/STENT PLACEMENT Left 01/05/2023   Procedure: CYSTOSCOPY, LEFT URETEROSCOPY, HOLMIUM LASER LITHOTRIPSY, AND LEFT URETERAL STENT PLACEMENT;  Surgeon: Rene Paci, MD;  Location: [facility];  Service: Urology;  Laterality: Left;  45 MINUTES   HIATAL HERNIA REPAIR     with gastric sleeve   LAPAROSCOPIC CHOLECYSTECTOMY SINGLE SITE WITH INTRAOPERATIVE CHOLANGIOGRAM N/A 06/14/2017   Procedure: LAPAROSCOPIC CHOLECYSTECTOMY SINGLE SITE;  Surgeon: Karie Soda, MD;  Location: [facility];  Service: General;  Laterality: N/A;   LAPAROSCOPIC GASTRIC SLEEVE RESECTION  09/2013   LASER ABLATION     Vascular   NASAL SINUS SURGERY  12/2021   TONSILLECTOMY  1988   WISDOM TOOTH EXTRACTION      Social History: Social History   Tobacco Use   Smoking status: Never    Passive exposure: Never   Smokeless tobacco: Never  Vaping Use   Vaping status: Never Used  Substance Use Topics   Alcohol use: Not Currently   Drug use: No    ROS: Negative for fevers, chills. Positive for headaches, blurred vision. All other systems reviewed and negative unless stated otherwise in HPI.       IMPRESSION: 52 year old female with a history of nephrolithiasis, depression, PCOS, connective tissue disorder, migraines who presents for follow up re: worsening headaches and blurred vision. Was seen by Dr. Delena Bali on 01/04/2022. MR brain unremarkable.  Migraines improved on Emgality.  Mentioned vertigo symptoms  at prior visit, has been persistent over the past year, currently reporting 4-5 episodes per week, continued blurred vision concerns with normal ophthalmology exam per patient.     PLAN:  Migraines -Prevention: Continue Emgality monthly injection -Rescue: try Zomig nasal spray for more severe migraines, may be beneficial for migraines associated with barometric pressure  changes.  Continue Bernita Raisin PRN -Counseled on limiting OTCs to avoid medication overuse headache -next steps: consider CGRP, Botox, qulipta for prevention   Vertigo -present over the past year  -Unclear etiology -MRI brain 01/2022 unremarkable -complete MRI c-spine, does have ongoing neck pain with right radiculopathy -discussed with Dr. Delena Bali - Previously declined interest in vestibular therapy, will reoffer but if still declines interest, can consider trial of meclizine but if no benefit, would recommend trying vestibular therapy at that point. Will notify patient via MyChart regarding recommendations    Blurred vision, intermittent -Present over the past year -MRI brain 01/2022 unremarkable -unsure if related to migraine but otherwise no clear neurological cause -continue to follow with ophthalmology Dr. Dione Booze     Follow up in 6 months or call earlier if needed   I spent 40 minutes of face-to-face and non-face-to-face time with patient via MyChart video visit.  This included previsit chart review, lab review, study review, order entry, electronic health record documentation, patient education and discussion regarding the above and answered all the questions to patient's satisfaction  Ihor Austin, [facility]  [facility] [address] [place], [place] [address]  Phone [phone] Fax [phone] Note: This document was prepared with digital dictation and possible smart phrase technology. Any transcriptional errors that result from this process are unintentional.

## 2023-02-01 NOTE — Telephone Encounter (Signed)
Pt was not feeling good changed appt to a MyChart Video Visit.  .. Pt understands that although there may be some limitations with this type of visit, we will take all precautions to reduce any security or privacy concerns.  Pt understands that this will be treated like an in office visit and we will file with pt's insurance, and there may be a patient responsible charge related to this service.

## 2023-02-02 MED ORDER — EMGALITY 120 MG/ML ~~LOC~~ SOAJ: 120.0000 mg | SUBCUTANEOUS | 11 refills | Status: DC

## 2023-02-07 ENCOUNTER — Telehealth: Payer: Self-pay | Admitting: Adult Health

## 2023-02-07 DIAGNOSIS — M542 Cervicalgia: Secondary | ICD-10-CM

## 2023-02-07 DIAGNOSIS — H538 Other visual disturbances: Secondary | ICD-10-CM

## 2023-02-07 DIAGNOSIS — R42 Dizziness and giddiness: Secondary | ICD-10-CM

## 2023-02-07 DIAGNOSIS — M5412 Radiculopathy, cervical region: Secondary | ICD-10-CM

## 2023-02-07 MED ORDER — ELYXYB 120 MG/4.8ML PO SOLN
120.0000 mg | ORAL | 11 refills | Status: DC | PRN
Start: 2023-02-07 — End: 2023-06-15

## 2023-02-07 NOTE — Telephone Encounter (Signed)
Elyxyb or diclofenac are good options since she seems pretty sensitive to the triptans. She could also try naratriptan as it tends to have fewer side effects than some of the other triptans

## 2023-02-07 NOTE — Telephone Encounter (Signed)
[employer] is not going to approve the MRI right now, they are requiring 6 weeks of provider directed treatment to be done in the last 3 months without improving symptoms.  Case #[id number]

## 2023-02-07 NOTE — Addendum Note (Signed)
Addended by: Ihor Austin L on: 02/07/2023 10:13 AM   Modules accepted: Orders

## 2023-02-07 NOTE — Telephone Encounter (Signed)
Patient unable to tolerate Zomig nasal spray. Would you recommend her trying Elyxyb for more severe headaches associated with barometric pressure or recommendations on any other treatment options? Thank you!

## 2023-02-08 NOTE — Telephone Encounter (Signed)
Did you notify patient?

## 2023-02-08 NOTE — Telephone Encounter (Signed)
She was referred to PT but for vestibular rehab, can add on for cervicalgia if needed if she wishes to eventually have imaging completed.  POD 3, can you contact patient regarding this? Thank you!

## 2023-02-08 NOTE — Telephone Encounter (Signed)
What is the treatment she is doing?

## 2023-02-09 NOTE — Addendum Note (Signed)
Addended by: Judi Cong on: 02/09/2023 12:08 PM   Modules accepted: Orders

## 2023-02-15 ENCOUNTER — Ambulatory Visit: Payer: 59 | Admitting: Physical Therapy

## 2023-02-23 ENCOUNTER — Ambulatory Visit: Payer: 59 | Attending: Adult Health

## 2023-02-23 DIAGNOSIS — R2681 Unsteadiness on feet: Secondary | ICD-10-CM | POA: Insufficient documentation

## 2023-02-23 DIAGNOSIS — M5412 Radiculopathy, cervical region: Secondary | ICD-10-CM | POA: Diagnosis not present

## 2023-02-23 DIAGNOSIS — R42 Dizziness and giddiness: Secondary | ICD-10-CM | POA: Diagnosis present

## 2023-02-23 DIAGNOSIS — M542 Cervicalgia: Secondary | ICD-10-CM | POA: Insufficient documentation

## 2023-02-23 DIAGNOSIS — H538 Other visual disturbances: Secondary | ICD-10-CM | POA: Diagnosis not present

## 2023-02-23 DIAGNOSIS — R293 Abnormal posture: Secondary | ICD-10-CM | POA: Insufficient documentation

## 2023-02-23 NOTE — Therapy (Signed)
OUTPATIENT PHYSICAL THERAPY NEURO EVALUATION   Patient Name: Jordan Mayer MRN: [id number] DOB:07-Sep-1970, 52 y.o., female Today's Date: 02/23/2023   PCP: Berniece Andreas, MD REFERRING PROVIDER: Ihor Austin, NP  END OF SESSION:  PT End of Session - 02/23/23 1440     Visit Number 1    Number of Visits 1    Authorization Type U[employer]    PT Start Time 1445    PT Stop Time 1530    PT Time Calculation (min) 45 min    Activity Tolerance Patient tolerated treatment well   limited by nausea   Behavior During Therapy WFL for tasks assessed/performed             Past Medical History:  Diagnosis Date   ADD (attention deficit disorder)    Allergy    Anxiety    Asthma    Chicken pox    as child   Chronic migraines    Concussion    x 2 no residual   Depression    Family history of melanoma    Gallstones    GERD (gastroesophageal reflux disease)    Kidney stones    Lupus    PCOS (polycystic ovarian syndrome)    Pneumonia 2003   Renal disorder    kidney disease   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    Shingles    Past Surgical History:  Procedure Laterality Date   BUNIONECTOMY Right    CHOLECYSTECTOMY     COLONOSCOPY     CYSTOSCOPY W/ URETERAL STENT PLACEMENT Left 12/29/2022   Procedure: CYSTOSCOPY WITH LEFT RETROGRADE PYELOGRAM/URETERAL STENT PLACEMENT;  Surgeon: Alfredo Martinez, MD;  Location: [facility];  Service: Urology;  Laterality: Left;   CYSTOSCOPY/URETEROSCOPY/HOLMIUM LASER/STENT PLACEMENT Left 01/05/2023   Procedure: CYSTOSCOPY, LEFT URETEROSCOPY, HOLMIUM LASER LITHOTRIPSY, AND LEFT URETERAL STENT PLACEMENT;  Surgeon: Rene Paci, MD;  Location: [facility];  Service: Urology;  Laterality: Left;  45 MINUTES   HIATAL HERNIA REPAIR     with gastric sleeve   LAPAROSCOPIC CHOLECYSTECTOMY SINGLE SITE WITH INTRAOPERATIVE CHOLANGIOGRAM N/A 06/14/2017   Procedure: LAPAROSCOPIC CHOLECYSTECTOMY SINGLE SITE;  Surgeon: Karie Soda, MD;  Location: [facility];   Service: General;  Laterality: N/A;   LAPAROSCOPIC GASTRIC SLEEVE RESECTION  09/2013   LASER ABLATION     Vascular   NASAL SINUS SURGERY  12/2021   TONSILLECTOMY  1988   WISDOM TOOTH EXTRACTION     Patient Active Problem List   Diagnosis Date Noted   Nephrolithiasis 12/29/2022   Ureteral stone with hydronephrosis 12/29/2022   Positive ANA (antinuclear antibody) 04/12/2022   Genetic testing 07/21/2021   Family history of melanoma 07/13/2021   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   Vertigo 12/23/2011   Otalgia of both ears 09/17/2010   SLEEP DISORDER/DISTURBANCE 09/27/2008   FATIGUE 07/15/2008   ADHD 12/05/2007   HEADACHE 11/26/2007   ALLERGIC RHINITIS 10/13/2007   NEPHROLITHIASIS, HX OF 10/13/2007   NONSPECIFIC MESENTERIC LYMPHADENITIS 03/17/2007   RENAL CALCULUS, LEFT 03/17/2007   DEPRESSION 03/07/2007   DYSPNEA ON EXERTION 03/07/2007    ONSET DATE: 02/09/23 referral  REFERRING DIAG:  M54.12 (ICD-10-CM) - Cervical radiculopathy  M54.2 (ICD-10-CM) - Cervicalgia  R42 (ICD-10-CM) - Vertigo  H53.8 (ICD-10-CM) - Blurred vision    THERAPY DIAG:  Abnormal posture - Plan: PT plan of care cert/re-cert  Dizziness and giddiness - Plan: PT plan of care cert/re-cert  Unsteadiness on feet -  Plan: PT plan of care cert/re-cert  Rationale for Evaluation and Treatment: Rehabilitation  SUBJECTIVE:                                                                                                                                                                                             SUBJECTIVE STATEMENT: Patient arrives to clinic alone, no AD. Reports year+ long h/o vertigo that is "cramping her lifestyle." Also has a "neck issue" with a pinched nerve and bone spur btwn C5-C6. Vertigo started after neck injury. Does have a distant h/o concussions. Has a h/o migraines and so far only  preventative rx has been effective, not abortive. Reports "discomfort" in R side of neck up into her jaw and into her ear.  Pt accompanied by: self  PERTINENT HISTORY: Aanxiety, asthma, chronic migraines, GERD, Lupus, POCS, CKD  PAIN:  Are you having pain? No  PRECAUTIONS: None   WEIGHT BEARING RESTRICTIONS: No  FALLS: Has patient fallen in last 6 months? Yes. Number of falls at 2x slipping down stairs  LIVING ENVIRONMENT: Lives with: lives with their family Lives in: House/apartment Stairs: Yes: Internal: flight steps; on right going up and External: "a few" steps; on right going up Has following equipment at home: shower chair  PLOF: Independent  PATIENT GOALS: "to not be dizzy"  OBJECTIVE:  Note: Objective measures were completed at Evaluation unless otherwise noted.  DIAGNOSTIC FINDINGS:  01/26/22 brain MRI IMPRESSION:    Unremarkable MRI brain without contrast.  No acute findings.  COGNITION: Overall cognitive status: Within functional limits for tasks assessed   SENSATION: WFL Endorses intermittent R UE tingling  COORDINATION: WFL   POSTURE: No Significant postural limitations   GAIT: Gait pattern:  slow gait speed, step through pattern, and wide BOS Distance walked: clinic Assistive device utilized: None  PATIENT SURVEYS:  FOTO 53, expected to be at 51  Vestibular Asssessment   General Observation: NAD, no AD    Symptom Behavior:   Subjective history: see above   Non-Vestibular symptoms: nausea/vomiting and "vision changes every day, might be related to Lupus"   Type of dizziness: Spinning/Vertigo   Frequency: multiple times a week    Duration: "40 mins to 3 days"   Aggravating factors: Worse outside or in busy environment and Moving eyes   Relieving factors: avoid busy/distracting environments   Progression of symptoms: worse   Oculomotor Exam:   Ocular Alignment: normal   Ocular ROM: No Limitations   Spontaneous Nystagmus:  absent   Gaze-Induced Nystagmus: absent   Smooth Pursuits: intact increased nausea   Saccades: extra eye movements   Convergence/Divergence: ~15 cm  Vestibular-Ocular Reflex (VOR):   Slow VOR: Normal   VOR Cancellation: Normal   Head-Impulse Test: HIT Right: negative HIT Left: negative   Dynamic Visual Acuity: to be assessed    Positional Testing: Right Dix-Hallpike: no nystagmus Left Dix-Hallpike: no nystagmus Right Sidelying: no nystagmus Left Sidelying: no nystagmus    Motion Sensitivity:   Motion Sensitivity Quotient  Intensity: 0 = none, 1 = Lightheaded, 2 = Mild, 3 = Moderate, 4 = Severe, 5 = Vomiting Duration: < 5 s = 0, 5-10s = 1,11-30s = 2, >30s = 3 Score = Intensity + duration     Intensity Duration Score  1. Sitting to supine 0    2. Supine to L side 0    3. Supine to R side 0    4. Supine to sitting 0    5. L Hallpike-Dix     6. Up from L      7. R Hallpike-Dix     8. Up from R      9. Sitting, head  tipped to L knee 0    10. Head up from L  knee 0    11. Sitting, head  tipped to R knee 0    12. Head up from R  knee 0    13. Sitting head turns x5 3    14.Sitting head nods x5 4    15. In stance, 180  turn to L  1    16. In stance, 180  turn to R 0     MSQ = Total score  (# of positions) / 20.48 MSQ = __________________  0-10 mild; 11-30 moderate; 31-100 severe  VBI:    R: (-) L: (+)   TODAY'S TREATMENT:                                                                                                                              N/a eval   PATIENT EDUCATION: Education details: PT POC, exam findings Person educated: Patient Education method: Explanation Education comprehension: verbalized understanding   GOALS: Not indicated as patient would not benefit from skilled PT services at this time.   ASSESSMENT:  CLINICAL IMPRESSION: Patient is a 52 y.o. female who was seen today for physical therapy evaluation and treatment for  dizziness and neck pain. Patient with essentially normal vestibular exam. Does present with some motion sensitivity, but this has been present throughout her life. Increased convergence, but patient endorses significantly fluctuating vision, sometimes on a daily basis. She reports sometimes blurred vision, sometimes vertical diplopia, sometimes horizontal diplopia. This, in conjunction with her positive L VBI is concerning. She would not be a good candidate for PT, especially cervical or vestibular PT, at this time given findings on eval without further imaging.    CLINICAL DECISION MAKING: Unstable/unpredictable  EVALUATION COMPLEXITY: High  PLAN:  PT FREQUENCY: one time visit  PT DURATION: other: 1x visit   Westley Foots, PT Ginette Pitman  Veneta Penton, PT, DPT, CBIS  02/23/2023, 3:49 PM

## 2023-02-24 ENCOUNTER — Telehealth: Payer: Self-pay | Admitting: Adult Health

## 2023-02-24 DIAGNOSIS — G450 Vertebro-basilar artery syndrome: Secondary | ICD-10-CM

## 2023-02-24 DIAGNOSIS — M542 Cervicalgia: Secondary | ICD-10-CM

## 2023-02-24 DIAGNOSIS — H814 Vertigo of central origin: Secondary | ICD-10-CM

## 2023-02-24 NOTE — Telephone Encounter (Signed)
Patient evaluated by PT for vertigo and cervicalgia on 02/23/2023. Received message from Merry Lofty, PT with essentially normal vestibular exam except some motion sensitivity which is chronic, she did note positive L VBI reporting daily fluctuations in vision, including blurred vision, horizontal diplopia and vertical diplopia. She did not feel vestibular PT entirely warranted given exam but she did not feel it was safe to participate in cervical PT without further imaging.   At this juncture, I will place another order for MRI cervical and MRA neck for further evaluation. Prior MRI c-spine denied by insurance and required at least 6 weeks of treatment in the past 3 months without improving symptoms but based on the above, further imaging strongly recommended prior to pursuing any further therapy. Please advise patient. Thank you!

## 2023-02-25 ENCOUNTER — Other Ambulatory Visit (HOSPITAL_COMMUNITY): Payer: Self-pay

## 2023-03-08 ENCOUNTER — Other Ambulatory Visit (HOSPITAL_COMMUNITY): Payer: Self-pay

## 2023-03-08 ENCOUNTER — Telehealth: Payer: Self-pay | Admitting: Pharmacy Technician

## 2023-03-08 NOTE — Telephone Encounter (Signed)
Pharmacy Patient Advocate Encounter   Received notification from CoverMyMeds that prior authorization for Elyxyb 120MG /4.8ML solution is required/requested.   Insurance verification completed.   The patient is insured through [facility] .   Per test claim: PA required; PA submitted to [facility] via CoverMyMeds Key/confirmation #/EOC [id number] Status is pending

## 2023-03-16 ENCOUNTER — Ambulatory Visit (INDEPENDENT_AMBULATORY_CARE_PROVIDER_SITE_OTHER): Payer: 59

## 2023-03-16 DIAGNOSIS — M542 Cervicalgia: Secondary | ICD-10-CM

## 2023-03-16 DIAGNOSIS — G450 Vertebro-basilar artery syndrome: Secondary | ICD-10-CM | POA: Diagnosis not present

## 2023-03-16 DIAGNOSIS — H814 Vertigo of central origin: Secondary | ICD-10-CM | POA: Diagnosis not present

## 2023-03-18 DIAGNOSIS — G450 Vertebro-basilar artery syndrome: Secondary | ICD-10-CM

## 2023-03-18 DIAGNOSIS — H532 Diplopia: Secondary | ICD-10-CM

## 2023-03-18 DIAGNOSIS — M542 Cervicalgia: Secondary | ICD-10-CM

## 2023-03-18 DIAGNOSIS — H538 Other visual disturbances: Secondary | ICD-10-CM

## 2023-03-18 DIAGNOSIS — R42 Dizziness and giddiness: Secondary | ICD-10-CM

## 2023-03-21 NOTE — Telephone Encounter (Signed)
Further discussed plan with Dr. Marjory Lies.  Unfortunately, MRA neck wo contrast was unable to be completed correctly. Previously placed order for imaging wo contrast due to hx of contrast allergy. After discussion with Dr. 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 Patient agreed with proceeding.

## 2023-03-24 ENCOUNTER — Telehealth: Payer: Self-pay | Admitting: Adult Health

## 2023-03-24 NOTE — Telephone Encounter (Signed)
Can someone assist with this and verify this is correct? Dr. Marjory Lies and Dr. Pearlean Brownie both said that they typically will order the allergy protocol medications but if this is not the case, please see what medications need to be ordered and what dosage. Thank you.

## 2023-03-24 NOTE — Telephone Encounter (Signed)
CTA head: [employer] Berkley Harvey: [id number] exp. 03/24/23-05/08/23  CTA neck: UHC auth: [id number] exp. 03/24/23-05/08/23 sent to [facility] [phone]

## 2023-03-30 ENCOUNTER — Ambulatory Visit: Payer: 59 | Admitting: Family Medicine

## 2023-03-30 MED ORDER — PREDNISONE 50 MG PO TABS: ORAL_TABLET | ORAL | 0 refills | Status: DC

## 2023-03-30 NOTE — Addendum Note (Signed)
Addended by: Judi Cong on: 03/30/2023 10:43 AM   Modules accepted: Orders

## 2023-03-30 NOTE — Telephone Encounter (Signed)
Called and spoke to CT technician and confirmed that the patient has hives as allergy to contrast.  She states the protocol is 50 mg prednisone 13 hrs prior to procedure (9 pm), another 50 mg of prednisone 7 hrs prior (3 am) and another 50 mg of prednisone along with 50 mg benadryl 1 hr prior to procedure at 9 am. Will place this order and send to the pharmacy for the patient and make the patient aware.

## 2023-04-01 ENCOUNTER — Ambulatory Visit (HOSPITAL_COMMUNITY)
Admission: RE | Admit: 2023-04-01 | Discharge: 2023-04-01 | Disposition: A | Discharge status: Home / Self Care | Payer: 59 | Source: Ambulatory Visit | Attending: Adult Health | Admitting: Adult Health

## 2023-04-01 DIAGNOSIS — M542 Cervicalgia: Secondary | ICD-10-CM | POA: Diagnosis present

## 2023-04-01 DIAGNOSIS — H532 Diplopia: Secondary | ICD-10-CM | POA: Diagnosis present

## 2023-04-01 DIAGNOSIS — H538 Other visual disturbances: Secondary | ICD-10-CM | POA: Diagnosis present

## 2023-04-01 DIAGNOSIS — G450 Vertebro-basilar artery syndrome: Secondary | ICD-10-CM | POA: Insufficient documentation

## 2023-04-01 DIAGNOSIS — R42 Dizziness and giddiness: Secondary | ICD-10-CM | POA: Diagnosis present

## 2023-04-01 MED ORDER — IOHEXOL 350 MG/ML SOLN
75.0000 mL | Freq: Once | INTRAVENOUS | Status: AC | PRN
  Administered 2023-04-01: 75 mL via INTRAVENOUS

## 2023-04-25 ENCOUNTER — Encounter: Payer: Self-pay | Admitting: Adult Health

## 2023-04-27 NOTE — Telephone Encounter (Signed)
Called pt made a office visit appointment with dr Marylou Flesher 05/04/2023 Pt was appreciative

## 2023-04-29 LAB — LAB REPORT - SCANNED: EGFR: 72

## 2023-05-02 ENCOUNTER — Other Ambulatory Visit (HOSPITAL_COMMUNITY): Payer: Self-pay | Admitting: Obstetrics & Gynecology

## 2023-05-02 DIAGNOSIS — N939 Abnormal uterine and vaginal bleeding, unspecified: Secondary | ICD-10-CM

## 2023-05-04 ENCOUNTER — Ambulatory Visit: Payer: 59 | Admitting: Neurology

## 2023-05-05 ENCOUNTER — Ambulatory Visit (HOSPITAL_COMMUNITY)
Admission: RE | Admit: 2023-05-05 | Discharge: 2023-05-05 | Disposition: A | Discharge status: Home / Self Care | Payer: 59 | Source: Ambulatory Visit | Attending: Obstetrics & Gynecology | Admitting: Obstetrics & Gynecology

## 2023-05-05 DIAGNOSIS — N939 Abnormal uterine and vaginal bleeding, unspecified: Secondary | ICD-10-CM | POA: Insufficient documentation

## 2023-05-30 ENCOUNTER — Telehealth: Payer: Self-pay | Admitting: Neurology

## 2023-05-30 ENCOUNTER — Ambulatory Visit: Payer: 59 | Admitting: Neurology

## 2023-05-30 NOTE — Telephone Encounter (Signed)
 Spouse called back, pt has been rescheduled and is on the wait list.

## 2023-05-30 NOTE — Telephone Encounter (Signed)
 Spouse asked to r/s pt's appointment due to pt being sick.  Spouse sent over to billing re: No Show fee before appointment can be r/s.

## 2023-05-31 ENCOUNTER — Other Ambulatory Visit: Payer: Self-pay | Admitting: Physician Assistant

## 2023-06-13 ENCOUNTER — Encounter (HOSPITAL_BASED_OUTPATIENT_CLINIC_OR_DEPARTMENT_OTHER): Payer: Self-pay | Admitting: Obstetrics and Gynecology

## 2023-06-13 NOTE — Progress Notes (Signed)
Spoke w/ via phone for pre-op interview--- Jordan Mayer needs dos----  CBC, UPT and T&S       Mayer results------ COVID test -----patient states asymptomatic no test needed Arrive at -------1000 NPO after MN NO Solid Food.  Clear liquids from MN until---0900 Med rec completed Medications to take morning of surgery -----Effexor Diabetic medication ----- Patient instructed no nail polish to be worn day of surgery Patient instructed to bring photo id and insurance card day of surgery Patient aware to have Driver (ride ) / caregiver    for 24 hours after surgery - Husband Jordan Mayer Patient Special Instructions ----- Pre-Op special Instructions ----- Patient verbalized understanding of instructions that were given at this phone interview. Patient denies chest pain, sob, fever, cough at the interview.

## 2023-06-14 NOTE — H&P (Signed)
Jordan Mayer is a 53 y.o. female P2 with abnormal uterine bleeding with US showing thickened heterogeneous endometrium and endometrial mass.   Patient received routine GYN care with Dr. Aldona Bar. Was seen by Dr. Aldona Bar for persistent vaginal bleeding after stopping OCPs. Was started on Provera 10mg  then switched to progesterone 200mg  with moderate improvement. During this time she had an ultrasound (05/05/23) which showed: "The uterus is anteverted in position and measures 8.4 x 4.3 x 5.2 cm. It demonstrates a normal, homogeneous echotexture. The endometrium is thickened measuring 1.3 cm, demonstrates a heterogeneous echotexture with a 1.4 x 0.5 x 1.1 cm echogenic area without internal vascularity. Nabothian cysts are visualized within the cervix.   The right ovary measures 2.9 x 1.9 x 2.3 cm and demonstrates a normal echotexture a 2.1 cm anechoic cyst. There is normal color Doppler flow.   The left ovary is not identified"  She was referred to me for consideration of hysteroscopy.   At preop appointment on 06/07/23, noted cloudy urine which was sent for culture, which confirmed UTI. She was treated with ciprofloxacin.   Pertinent Gynecological History: Bleeding: dysfunctional uterine bleeding Contraception: none DES exposure: denies Blood transfusions: none Sexually transmitted diseases: no past history Previous GYN Procedures:  none   Last mammogram: normal Date: 09/23/22 Last pap: normal Date: 08/04/20 OB History: G2, P2   Menstrual History: No LMP recorded (lmp unknown).    Past Medical History:  Diagnosis Date   ADD (attention deficit disorder)    Allergy    Anxiety    Asthma    Chicken pox    as child   Chronic migraines    Concussion    x 2 no residual   Depression    Family history of melanoma    Gallstones    GERD (gastroesophageal reflux disease)    Kidney stones    Lupus    PCOS (polycystic ovarian syndrome)    Pneumonia 2003   Renal disorder    kidney  disease   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    Shingles     Past Surgical History:  Procedure Laterality Date   BUNIONECTOMY Right    CHOLECYSTECTOMY     COLONOSCOPY     CYSTOSCOPY W/ URETERAL STENT PLACEMENT Left 12/29/2022   Procedure: CYSTOSCOPY WITH LEFT RETROGRADE PYELOGRAM/URETERAL STENT PLACEMENT;  Surgeon: Alfredo Martinez, MD;  Location: [facility];  Service: Urology;  Laterality: Left;   CYSTOSCOPY/URETEROSCOPY/HOLMIUM LASER/STENT PLACEMENT Left 01/05/2023   Procedure: CYSTOSCOPY, LEFT URETEROSCOPY, HOLMIUM LASER LITHOTRIPSY, AND LEFT URETERAL STENT PLACEMENT;  Surgeon: Rene Paci, MD;  [facility];  Service: Urology;  Laterality: Left;  45 MINUTES   HIATAL HERNIA REPAIR     with gastric sleeve   LAPAROSCOPIC CHOLECYSTECTOMY SINGLE SITE WITH INTRAOPERATIVE CHOLANGIOGRAM N/A 06/14/2017   Procedure: LAPAROSCOPIC CHOLECYSTECTOMY SINGLE SITE;  Surgeon: Karie Soda, MD;  Location: [facility];  Service: General;  Laterality: N/A;   LAPAROSCOPIC GASTRIC SLEEVE RESECTION  09/2013   LASER ABLATION     Vascular   NASAL SINUS SURGERY  12/2021   TONSILLECTOMY  1988   WISDOM TOOTH EXTRACTION      Family History  Problem Relation Age of Onset   Varicose Veins Mother    Asthma Mother    Myasthenia gravis Mother        Ocular   Varicose Veins Sister    Arthritis Maternal Grandmother    Arthritis Paternal Grandfather    Hearing loss Paternal Grandfather    Healthy Son  Healthy Son    Birth defects Paternal Aunt        Cognitively Challenged   Miscarriages / Stillbirths Paternal Aunt    Varicose Veins Paternal Aunt    Varicose Veins Paternal Aunt    Melanoma Paternal Aunt        melanoma x2   Prostate cancer Cousin    Colon cancer Neg Hx    Colon polyps Neg Hx    Esophageal cancer Neg Hx    Rectal cancer Neg Hx    Stomach cancer Neg Hx     Social History:  reports that she has never smoked. She has never been exposed to tobacco smoke. She has never  used smokeless tobacco. She reports that she does not currently use alcohol. She reports that she does not use drugs.  Allergies:  Allergies  Allergen Reactions   Contrast Media [Iodinated Contrast Media] Hives   Iohexol      Code: HIVES, Desc: 1 hive developed on hand post injection of 125cc's Omni 300., Onset Date: [id number]    Penicillins Other (See Comments)    Has patient had a PCN reaction causing immediate rash, facial/tongue/throat swelling, SOB or lightheadedness with hypotension: Yes Has patient had a PCN reaction causing severe rash involving mucus membranes or skin necrosis: No Has patient had a PCN reaction that required hospitalization: No Has patient had a PCN reaction occurring within the last 10 years: No If all of the above answers are "NO", then may proceed with Cephalosporin use.   Gadolinium Derivatives Hives, Itching and Rash    No medications prior to admission.    Review of Systems  Constitutional:  Negative for fever.  HENT:  Negative for sore throat.   Eyes:  Negative for visual disturbance.  Respiratory:  Negative for shortness of breath.   Cardiovascular:  Negative for chest pain.  Gastrointestinal:  Negative for abdominal pain.  Genitourinary:  Positive for menstrual problem.  Musculoskeletal:  Negative for neck pain.  Skin:  Negative for rash.  Neurological:  Negative for headaches.  Psychiatric/Behavioral:  Negative for suicidal ideas.     Height 5\' 5"  (1.651 m), weight 81.6 kg. Physical Exam  Chaperone Chaperone: present  Constitutional General Appearance: healthy-appearing, well-nourished, well-developed  Psychiatric Orientation: to time, to place, to person Mood and Affect: active and alert, normal mood, normal affect  Abdomen Auscultation/Inspection/Palpation: normal bowel sounds, soft, non-distended, no tenderness, no hepatomegaly, no splenomegaly, no masses, no CVA tenderness Hernia: none palpated  Female Genitalia Vulva: no  masses, no atrophy, no lesions Bladder/Urethra: normal meatus, no urethral discharge, no urethral mass, bladder non distended Vagina no tenderness, no erythema, no abnormal vaginal discharge, no vesicle(s) or ulcers, no cystocele, no rectocele Cervix: grossly normal, no discharge, no cervical motion tenderness Uterus: normal size, normal shape, midline, mobile, non-tender, no uterine prolapse Adnexa/Parametria: no parametrial tenderness, no parametrial mass, no adnexal tenderness, no ovarian mass No results found for this or any previous visit (from the past 24 hours).  No results found.  Assessment/Plan: 52Y P2 with abnormal uterine bleeding, US showing thickened endometrium and endometrial mass.  - Plan: hysteroscopy, dilation and curettage  - Reviewed risks/benefits/alternatives in detail. Reviewed risks including infection, bleeding, uterine perforation, failure to complete procedure or achieve desired result. All questions answered.    Charlett Nose 06/14/2023, 8:56 PM

## 2023-06-15 ENCOUNTER — Encounter (HOSPITAL_BASED_OUTPATIENT_CLINIC_OR_DEPARTMENT_OTHER): Payer: Self-pay | Admitting: Obstetrics and Gynecology

## 2023-06-15 ENCOUNTER — Ambulatory Visit (HOSPITAL_BASED_OUTPATIENT_CLINIC_OR_DEPARTMENT_OTHER): Payer: 59 | Admitting: Anesthesiology

## 2023-06-15 ENCOUNTER — Encounter (HOSPITAL_BASED_OUTPATIENT_CLINIC_OR_DEPARTMENT_OTHER)
Admission: RE | Disposition: A | Discharge status: Home / Self Care | Payer: Self-pay | Source: Home / Self Care | Attending: Obstetrics and Gynecology

## 2023-06-15 ENCOUNTER — Ambulatory Visit (HOSPITAL_BASED_OUTPATIENT_CLINIC_OR_DEPARTMENT_OTHER)
Admission: RE | Admit: 2023-06-15 | Discharge: 2023-06-15 | Disposition: A | Discharge status: Home / Self Care | Payer: 59 | Attending: Obstetrics and Gynecology | Admitting: Obstetrics and Gynecology

## 2023-06-15 ENCOUNTER — Other Ambulatory Visit: Payer: Self-pay

## 2023-06-15 DIAGNOSIS — K219 Gastro-esophageal reflux disease without esophagitis: Secondary | ICD-10-CM | POA: Insufficient documentation

## 2023-06-15 DIAGNOSIS — N939 Abnormal uterine and vaginal bleeding, unspecified: Secondary | ICD-10-CM | POA: Insufficient documentation

## 2023-06-15 DIAGNOSIS — E282 Polycystic ovarian syndrome: Secondary | ICD-10-CM | POA: Insufficient documentation

## 2023-06-15 DIAGNOSIS — M329 Systemic lupus erythematosus, unspecified: Secondary | ICD-10-CM | POA: Diagnosis not present

## 2023-06-15 DIAGNOSIS — N8003 Adenomyosis of the uterus: Secondary | ICD-10-CM | POA: Diagnosis not present

## 2023-06-15 DIAGNOSIS — F418 Other specified anxiety disorders: Secondary | ICD-10-CM

## 2023-06-15 DIAGNOSIS — Z01818 Encounter for other preprocedural examination: Secondary | ICD-10-CM

## 2023-06-15 DIAGNOSIS — J45909 Unspecified asthma, uncomplicated: Secondary | ICD-10-CM | POA: Diagnosis not present

## 2023-06-15 DIAGNOSIS — J189 Pneumonia, unspecified organism: Secondary | ICD-10-CM | POA: Diagnosis not present

## 2023-06-15 HISTORY — PX: DILATATION & CURETTAGE/HYSTEROSCOPY WITH MYOSURE: SHX6511

## 2023-06-15 LAB — CBC
HCT: 41.5 % (ref 36.0–46.0)
Hemoglobin: 13.8 g/dL (ref 12.0–15.0)
MCH: 28 pg (ref 26.0–34.0)
MCHC: 33.3 g/dL (ref 30.0–36.0)
MCV: 84.2 fL (ref 80.0–100.0)
Platelets: 239 10*3/uL (ref 150–400)
RBC: 4.93 MIL/uL (ref 3.87–5.11)
RDW: 12.5 % (ref 11.5–15.5)
WBC: 5.6 10*3/uL (ref 4.0–10.5)
nRBC: 0 % (ref 0.0–0.2)

## 2023-06-15 LAB — POCT PREGNANCY, URINE: Preg Test, Ur: NEGATIVE

## 2023-06-15 LAB — TYPE AND SCREEN
ABO/RH(D): O POS
Antibody Screen: NEGATIVE

## 2023-06-15 LAB — ABO/RH: ABO/RH(D): O POS

## 2023-06-15 SURGERY — DILATATION & CURETTAGE/HYSTEROSCOPY WITH MYOSURE
Anesthesia: General | Site: Uterus

## 2023-06-15 MED ORDER — EPHEDRINE 5 MG/ML INJ
INTRAVENOUS | Status: AC
  Filled 2023-06-15: qty 10

## 2023-06-15 MED ORDER — SUGAMMADEX SODIUM 200 MG/2ML IV SOLN
INTRAVENOUS | Status: DC | PRN
  Administered 2023-06-15: 200 mg via INTRAVENOUS

## 2023-06-15 MED ORDER — PHENYLEPHRINE 80 MCG/ML (10ML) SYRINGE FOR IV PUSH (FOR BLOOD PRESSURE SUPPORT)
PREFILLED_SYRINGE | INTRAVENOUS | Status: AC
  Filled 2023-06-15: qty 30

## 2023-06-15 MED ORDER — FENTANYL CITRATE (PF) 100 MCG/2ML IJ SOLN
INTRAMUSCULAR | Status: AC
  Filled 2023-06-15: qty 2

## 2023-06-15 MED ORDER — DEXAMETHASONE SODIUM PHOSPHATE 10 MG/ML IJ SOLN
INTRAMUSCULAR | Status: AC
  Filled 2023-06-15: qty 2

## 2023-06-15 MED ORDER — ROCURONIUM BROMIDE 100 MG/10ML IV SOLN
INTRAVENOUS | Status: DC | PRN
  Administered 2023-06-15: 50 mg via INTRAVENOUS

## 2023-06-15 MED ORDER — AMISULPRIDE (ANTIEMETIC) 5 MG/2ML IV SOLN
INTRAVENOUS | Status: AC
  Filled 2023-06-15: qty 4

## 2023-06-15 MED ORDER — ACETAMINOPHEN 500 MG PO TABS
ORAL_TABLET | ORAL | Status: AC
  Filled 2023-06-15: qty 2

## 2023-06-15 MED ORDER — OXYCODONE HCL 5 MG PO TABS
ORAL_TABLET | ORAL | Status: AC
  Filled 2023-06-15: qty 1

## 2023-06-15 MED ORDER — OXYCODONE HCL 5 MG/5ML PO SOLN: 5.0000 mg | Freq: Once | ORAL | Status: AC | PRN

## 2023-06-15 MED ORDER — MIDAZOLAM HCL 2 MG/2ML IJ SOLN
INTRAMUSCULAR | Status: DC | PRN
  Administered 2023-06-15: 2 mg via INTRAVENOUS

## 2023-06-15 MED ORDER — FENTANYL CITRATE (PF) 100 MCG/2ML IJ SOLN: 25.0000 ug | INTRAMUSCULAR | Status: DC | PRN

## 2023-06-15 MED ORDER — AMISULPRIDE (ANTIEMETIC) 5 MG/2ML IV SOLN
10.0000 mg | Freq: Once | INTRAVENOUS | Status: AC | PRN
Start: 2023-06-15 — End: 2023-06-15
  Administered 2023-06-15: 10 mg via INTRAVENOUS

## 2023-06-15 MED ORDER — IBUPROFEN 200 MG PO TABS: 600.0000 mg | ORAL_TABLET | Freq: Four times a day (QID) | ORAL | Status: DC | PRN

## 2023-06-15 MED ORDER — OXYCODONE HCL 5 MG PO TABS
5.0000 mg | ORAL_TABLET | Freq: Once | ORAL | Status: AC | PRN
Start: 2023-06-15 — End: 2023-06-15
  Administered 2023-06-15: 5 mg via ORAL

## 2023-06-15 MED ORDER — MIDAZOLAM HCL 2 MG/2ML IJ SOLN
INTRAMUSCULAR | Status: AC
  Filled 2023-06-15: qty 2

## 2023-06-15 MED ORDER — LACTATED RINGERS IV SOLN: INTRAVENOUS | Status: DC

## 2023-06-15 MED ORDER — ACETAMINOPHEN 160 MG/5ML PO SOLN: 325.0000 mg | ORAL | Status: DC | PRN

## 2023-06-15 MED ORDER — ONDANSETRON HCL 4 MG/2ML IJ SOLN
INTRAMUSCULAR | Status: AC
  Filled 2023-06-15: qty 2

## 2023-06-15 MED ORDER — PROPOFOL 1000 MG/100ML IV EMUL
INTRAVENOUS | Status: AC
  Filled 2023-06-15: qty 100

## 2023-06-15 MED ORDER — ACETAMINOPHEN 500 MG PO TABS
1000.0000 mg | ORAL_TABLET | ORAL | Status: AC
  Administered 2023-06-15: 1000 mg via ORAL

## 2023-06-15 MED ORDER — ROCURONIUM BROMIDE 10 MG/ML (PF) SYRINGE
PREFILLED_SYRINGE | INTRAVENOUS | Status: AC
  Filled 2023-06-15: qty 30

## 2023-06-15 MED ORDER — DEXMEDETOMIDINE HCL IN NACL 80 MCG/20ML IV SOLN
INTRAVENOUS | Status: DC | PRN
  Administered 2023-06-15: 4 ug via INTRAVENOUS

## 2023-06-15 MED ORDER — ACETAMINOPHEN 325 MG PO TABS: 650.0000 mg | ORAL_TABLET | Freq: Four times a day (QID) | ORAL | Status: DC | PRN

## 2023-06-15 MED ORDER — MEPERIDINE HCL 25 MG/ML IJ SOLN: 6.2500 mg | INTRAMUSCULAR | Status: DC | PRN

## 2023-06-15 MED ORDER — SODIUM CHLORIDE 0.9 % IR SOLN
Status: DC | PRN
  Administered 2023-06-15: 3000 mL

## 2023-06-15 MED ORDER — LIDOCAINE HCL (CARDIAC) PF 100 MG/5ML IV SOSY
PREFILLED_SYRINGE | INTRAVENOUS | Status: DC | PRN
  Administered 2023-06-15: 50 mg via INTRAVENOUS

## 2023-06-15 MED ORDER — ACETAMINOPHEN 325 MG PO TABS: 325.0000 mg | ORAL_TABLET | ORAL | Status: DC | PRN

## 2023-06-15 MED ORDER — PROPOFOL 10 MG/ML IV BOLUS
INTRAVENOUS | Status: DC | PRN
  Administered 2023-06-15: 150 mg via INTRAVENOUS

## 2023-06-15 MED ORDER — PROPOFOL 500 MG/50ML IV EMUL
INTRAVENOUS | Status: AC
  Filled 2023-06-15: qty 100

## 2023-06-15 MED ORDER — ONDANSETRON HCL 4 MG/2ML IJ SOLN
INTRAMUSCULAR | Status: AC
  Filled 2023-06-15: qty 10

## 2023-06-15 MED ORDER — PHENYLEPHRINE HCL (PRESSORS) 10 MG/ML IV SOLN
INTRAVENOUS | Status: DC | PRN
  Administered 2023-06-15: 80 ug via INTRAVENOUS

## 2023-06-15 MED ORDER — LIDOCAINE HCL 1 % IJ SOLN
INTRAMUSCULAR | Status: DC | PRN
  Administered 2023-06-15: 10 mL

## 2023-06-15 MED ORDER — ONDANSETRON HCL 4 MG/2ML IJ SOLN
4.0000 mg | Freq: Once | INTRAMUSCULAR | Status: AC | PRN
  Administered 2023-06-15: 4 mg via INTRAVENOUS

## 2023-06-15 MED ORDER — ONDANSETRON HCL 4 MG/2ML IJ SOLN
INTRAMUSCULAR | Status: DC | PRN
  Administered 2023-06-15: 4 mg via INTRAVENOUS

## 2023-06-15 MED ORDER — DEXAMETHASONE SODIUM PHOSPHATE 4 MG/ML IJ SOLN
INTRAMUSCULAR | Status: DC | PRN
  Administered 2023-06-15: 8 mg via INTRAVENOUS

## 2023-06-15 MED ORDER — FENTANYL CITRATE (PF) 100 MCG/2ML IJ SOLN
INTRAMUSCULAR | Status: DC | PRN
  Administered 2023-06-15: 50 ug via INTRAVENOUS

## 2023-06-15 SURGICAL SUPPLY — 16 items
CATH ROBINSON RED A/P 16FR (CATHETERS) ×1 IMPLANT
DEVICE MYOSURE LITE (MISCELLANEOUS) IMPLANT
DEVICE MYOSURE REACH (MISCELLANEOUS) IMPLANT
DRSG TELFA 3X8 NADH STRL (GAUZE/BANDAGES/DRESSINGS) ×1 IMPLANT
GAUZE 4X4 16PLY ~~LOC~~+RFID DBL (SPONGE) ×1 IMPLANT
GLOVE BIO SURGEON STRL SZ 6 (GLOVE) ×1 IMPLANT
GLOVE BIOGEL PI IND STRL 6.5 (GLOVE) ×1 IMPLANT
GOWN STRL REUS W/ TWL LRG LVL3 (GOWN DISPOSABLE) ×1 IMPLANT
KIT PROCEDURE FLUENT (KITS) ×1 IMPLANT
KIT TURNOVER CYSTO (KITS) ×1 IMPLANT
PACK VAGINAL MINOR WOMEN LF (CUSTOM PROCEDURE TRAY) ×1 IMPLANT
PAD OB MATERNITY 4.3X12.25 (PERSONAL CARE ITEMS) ×1 IMPLANT
SEAL ROD LENS SCOPE MYOSURE (ABLATOR) ×1 IMPLANT
SLEEVE SCD COMPRESS KNEE MED (STOCKING) ×1 IMPLANT
TOWEL OR 17X24 6PK STRL BLUE (TOWEL DISPOSABLE) ×1 IMPLANT
UNDERPAD 30X36 HEAVY ABSORB (UNDERPADS AND DIAPERS) ×1 IMPLANT

## 2023-06-15 NOTE — Interval H&P Note (Signed)
History and Physical Interval Note:  06/15/2023 11:56 AM  Jordan Mayer  has presented today for surgery, with the diagnosis of abnormal uterine bleeding.  The various methods of treatment have been discussed with the patient and family. After consideration of risks, benefits and other options for treatment, the patient has consented to  Procedure(s) with comments: DILATATION & CURETTAGE/HYSTEROSCOPY WITH MYOSURE (N/A) - instrument needed = myosure lite as a surgical intervention.  The patient's history has been reviewed, patient examined, no change in status, stable for surgery.  I have reviewed the patient's chart and labs.  Questions were answered to the patient's satisfaction.     Charlett Nose

## 2023-06-15 NOTE — Op Note (Signed)
06/15/2023   12:55 PM   PATIENT:  Jordan Mayer  53 y.o. female   PRE-OPERATIVE DIAGNOSIS:  abnormal uterine bleeding   POST-OPERATIVE DIAGNOSIS:  abnormal uterine bleeding   PROCEDURE:  Procedure(s) with comments: DILATATION & CURETTAGE/HYSTEROSCOPY WITH MYOSURE (N/A) - instrument needed = myosure lite   SURGEON:  Surgeons and Role:    * Charlett Nose, MD - Primary   ANESTHESIA:   local and general   EBL:  minimal    BLOOD ADMINISTERED:none   DRAINS: none    LOCAL MEDICATIONS USED:  LIDOCAINE  and Amount: 10 ml   SPECIMEN:  Source of Specimen:  endometrial curettings   DISPOSITION OF SPECIMEN:  PATHOLOGY   COUNTS:  YES   TOURNIQUET:  * No tourniquets in log *   DICTATION: .Note written in EPIC   PLAN OF CARE: Discharge to home after PACU   PATIENT DISPOSITION:  PACU - hemodynamically stable.  FINDINGS: anteverted uterus sounded to 8cm. On hysterscopic view, bilateral ostia visualized. Diffusely thickened polypoid tissue at anterior and posterior uterine walls. Otherwise thin appearing tissue.  Otherwise thin appearing endometrium. Total fluid deficit .  PROCEDURE IN DETAIL:  The patient was appropriately consented and taken to the operating room where anesthesia was administered without difficulty. Thromboguards were placed and connected. She was placed in the dorsal lithotomy position in stirrups. She was examined under anesthesia and found to have an anteverted uterus. The patient was then prepped and draped in normal sterile fashion. A long speculum was inserted into the vagina. A single-tooth tenaculum was used to grasp the anterior lip of the cervix. A paracervical block was obtained by injecting a total of 10mL of 1% lidocaine at the 4 and 8 o'clock positions at the cervicovaginal junction. The uterus was sounded to 8cm. The cervical os was sequentially dilated using Pratt dilators to accommodate the hysteroscope. The hysteroscope was introduced under  direct visualization and the uterus was distended with normal saline. Bilateral ostia were visualized. Findings as noted above. The Myosure Lite device was used to resect the thickened tissue.  Specimen was collected for pathology. Hemostasis was noted in the uterine cavity. The hysteroscope was removed.  The tenaculum was removed from the cervix and good hemostasis was noted at the puncture sites. The patient tolerated the procedure well. The instrument and sponge counts were correct times two. The patient was awakened from anesthesia and taken to the recovery room in stable condition.  Derl Barrow, MD 06/15/23 12:57 PM

## 2023-06-15 NOTE — Anesthesia Postprocedure Evaluation (Signed)
Anesthesia Post Note  Patient: Jordan Mayer  Procedure(s) Performed: DILATATION & CURETTAGE/HYSTEROSCOPY WITH MYOSURE (Uterus)     Patient location during evaluation: PACU Anesthesia Type: General Level of consciousness: awake and alert Pain management: pain level controlled Vital Signs Assessment: post-procedure vital signs reviewed and stable Respiratory status: spontaneous breathing, nonlabored ventilation, respiratory function stable and patient connected to nasal cannula oxygen Cardiovascular status: blood pressure returned to baseline and stable Postop Assessment: no apparent nausea or vomiting Anesthetic complications: no   No notable events documented.  Last Vitals:  Vitals:   06/15/23 1400 06/15/23 1425  BP:  100/69  Pulse:  78  Resp:  14  Temp: 36.7 C   SpO2:  98%    Last Pain:  Vitals:   06/15/23 1425  TempSrc:   PainSc: 3                  Kianna Billet

## 2023-06-15 NOTE — Transfer of Care (Signed)
Immediate Anesthesia Transfer of Care Note  Patient: Jordan Mayer  Procedure(s) Performed: DILATATION & CURETTAGE/HYSTEROSCOPY WITH MYOSURE (Uterus)  Patient Location: PACU  Anesthesia Type:General  Level of Consciousness: awake, alert , oriented, and patient cooperative  Airway & Oxygen Therapy: Patient Spontanous Breathing and Patient connected to nasal cannula oxygen  Post-op Assessment: Report given to RN and Post -op Vital signs reviewed and stable  Post vital signs: Reviewed and stable  Last Vitals:  Vitals Value Taken Time  BP 95/65 06/15/23 1302  Temp    Pulse 100 06/15/23 1306  Resp 12 06/15/23 1306  SpO2 93 % 06/15/23 1306  Vitals shown include unfiled device data.  Last Pain:  Vitals:   06/15/23 1033  TempSrc: Oral  PainSc: 1       Patients Stated Pain Goal: 6 (06/15/23 1033)  Complications: No notable events documented.

## 2023-06-15 NOTE — Discharge Instructions (Signed)

## 2023-06-15 NOTE — Anesthesia Preprocedure Evaluation (Addendum)
Anesthesia Evaluation  Patient identified by MRN, date of birth, ID band Patient awake    Reviewed: Allergy & Precautions, NPO status , Patient's Chart, lab work & pertinent test results  Airway Mallampati: II  TM Distance: >3 FB Neck ROM: Full    Dental no notable dental hx. (+) Teeth Intact, Dental Advisory Given, Caps   Pulmonary asthma , pneumonia   Pulmonary exam normal        Cardiovascular negative cardio ROS Normal cardiovascular exam     Neuro/Psych  Headaches PSYCHIATRIC DISORDERS Anxiety Depression       GI/Hepatic negative GI ROS, Neg liver ROS,GERD  ,,  Endo/Other  PCOS (polycystic ovarian syndrome)  Renal/GU Renal disease     Musculoskeletal negative musculoskeletal ROS (+)    Abdominal  (+) + obese  Peds  Hematology negative hematology ROS (+)   Anesthesia Other Findings LEFT URETERAL STONE  Reproductive/Obstetrics                             Anesthesia Physical Anesthesia Plan  ASA: 2  Anesthesia Plan: General   Post-op Pain Management: Tylenol PO (pre-op)*   Induction: Intravenous and Cricoid pressure planned  PONV Risk Score and Plan: 4 or greater and Ondansetron, Dexamethasone, Midazolam and Treatment may vary due to age or medical condition  Airway Management Planned: Oral ETT  Additional Equipment: None  Intra-op Plan:   Post-operative Plan: Extubation in OR  Informed Consent: I have reviewed the patients History and Physical, chart, labs and discussed the procedure including the risks, benefits and alternatives for the proposed anesthesia with the patient or authorized representative who has indicated his/her understanding and acceptance.     Dental advisory given  Plan Discussed with: CRNA and Anesthesiologist  Anesthesia Plan Comments:        Anesthesia Quick Evaluation

## 2023-06-15 NOTE — Anesthesia Procedure Notes (Signed)
Procedure Name: Intubation Date/Time: 06/15/2023 12:27 PM  Performed by: Earmon Phoenix, CRNAPre-anesthesia Checklist: Patient identified, Emergency Drugs available, Suction available, Patient being monitored and Timeout performed Patient Re-evaluated:Patient Re-evaluated prior to induction Oxygen Delivery Method: Circle system utilized Preoxygenation: Pre-oxygenation with 100% oxygen Induction Type: IV induction Ventilation: Mask ventilation without difficulty Laryngoscope Size: Mac and 3 Grade View: Grade I Tube type: Oral Tube size: 7.0 mm Number of attempts: 1 Airway Equipment and Method: Stylet Placement Confirmation: ETT inserted through vocal cords under direct vision, positive ETCO2 and breath sounds checked- equal and bilateral Secured at: 21 cm Tube secured with: Tape Dental Injury: Teeth and Oropharynx as per pre-operative assessment

## 2023-06-15 NOTE — Brief Op Note (Signed)
06/15/2023  12:55 PM  PATIENT:  Jordan Mayer  53 y.o. female  PRE-OPERATIVE DIAGNOSIS:  abnormal uterine bleeding  POST-OPERATIVE DIAGNOSIS:  abnormal uterine bleeding  PROCEDURE:  Procedure(s) with comments: DILATATION & CURETTAGE/HYSTEROSCOPY WITH MYOSURE (N/A) - instrument needed = myosure lite  SURGEON:  Surgeons and Role:    * Charlett Nose, MD - Primary  ANESTHESIA:   local and general  EBL:  minimal   BLOOD ADMINISTERED:none  DRAINS: none   LOCAL MEDICATIONS USED:  LIDOCAINE  and Amount: 10 ml  SPECIMEN:  Source of Specimen:  endometrial curettings  DISPOSITION OF SPECIMEN:  PATHOLOGY  COUNTS:  YES  TOURNIQUET:  * No tourniquets in log *  DICTATION: .Note written in EPIC  PLAN OF CARE: Discharge to home after PACU  PATIENT DISPOSITION:  PACU - hemodynamically stable.   Delay start of Pharmacological VTE agent (>24hrs) due to surgical blood loss or risk of bleeding: not applicable

## 2023-06-16 ENCOUNTER — Encounter (HOSPITAL_BASED_OUTPATIENT_CLINIC_OR_DEPARTMENT_OTHER): Payer: Self-pay | Admitting: Obstetrics and Gynecology

## 2023-06-16 LAB — SURGICAL PATHOLOGY

## 2023-06-24 ENCOUNTER — Emergency Department (HOSPITAL_COMMUNITY): Payer: 59

## 2023-06-24 ENCOUNTER — Encounter (HOSPITAL_COMMUNITY): Payer: Self-pay | Admitting: Emergency Medicine

## 2023-06-24 ENCOUNTER — Emergency Department (HOSPITAL_COMMUNITY)
Admission: EM | Admit: 2023-06-24 | Discharge: 2023-06-24 | Disposition: A | Discharge status: Home / Self Care | Payer: 59 | Attending: Emergency Medicine | Admitting: Emergency Medicine

## 2023-06-24 DIAGNOSIS — Z20822 Contact with and (suspected) exposure to covid-19: Secondary | ICD-10-CM | POA: Diagnosis not present

## 2023-06-24 DIAGNOSIS — R531 Weakness: Secondary | ICD-10-CM | POA: Diagnosis present

## 2023-06-24 DIAGNOSIS — E860 Dehydration: Secondary | ICD-10-CM | POA: Diagnosis not present

## 2023-06-24 LAB — BASIC METABOLIC PANEL
Anion gap: 12 (ref 5–15)
BUN: 14 mg/dL (ref 6–20)
CO2: 22 mmol/L (ref 22–32)
Calcium: 9.8 mg/dL (ref 8.9–10.3)
Chloride: 104 mmol/L (ref 98–111)
Creatinine, Ser: 1.19 mg/dL — ABNORMAL HIGH (ref 0.44–1.00)
GFR, Estimated: 55 mL/min — ABNORMAL LOW (ref >60)
Glucose, Bld: 92 mg/dL (ref 70–99)
Potassium: 3.8 mmol/L (ref 3.5–5.1)
Sodium: 138 mmol/L (ref 135–145)

## 2023-06-24 LAB — CBC WITH DIFFERENTIAL/PLATELET
Abs Immature Granulocytes: 0.02 10*3/uL (ref 0.00–0.07)
Basophils Absolute: 0 10*3/uL (ref 0.0–0.1)
Basophils Relative: 1 %
Eosinophils Absolute: 0.1 10*3/uL (ref 0.0–0.5)
Eosinophils Relative: 2 %
HCT: 42.5 % (ref 36.0–46.0)
Hemoglobin: 14.5 g/dL (ref 12.0–15.0)
Immature Granulocytes: 0 %
Lymphocytes Relative: 26 %
Lymphs Abs: 1.6 10*3/uL (ref 0.7–4.0)
MCH: 28.2 pg (ref 26.0–34.0)
MCHC: 34.1 g/dL (ref 30.0–36.0)
MCV: 82.7 fL (ref 80.0–100.0)
Monocytes Absolute: 1.1 10*3/uL — ABNORMAL HIGH (ref 0.1–1.0)
Monocytes Relative: 18 %
Neutro Abs: 3.2 10*3/uL (ref 1.7–7.7)
Neutrophils Relative %: 53 %
Platelets: 267 10*3/uL (ref 150–400)
RBC: 5.14 MIL/uL — ABNORMAL HIGH (ref 3.87–5.11)
RDW: 12.4 % (ref 11.5–15.5)
WBC: 6.1 10*3/uL (ref 4.0–10.5)
nRBC: 0 % (ref 0.0–0.2)

## 2023-06-24 LAB — RESP PANEL BY RT-PCR (RSV, FLU A&B, COVID)  RVPGX2
Influenza A by PCR: NEGATIVE
Influenza B by PCR: NEGATIVE
Resp Syncytial Virus by PCR: NEGATIVE
SARS Coronavirus 2 by RT PCR: NEGATIVE

## 2023-06-24 LAB — URINALYSIS, ROUTINE W REFLEX MICROSCOPIC
Glucose, UA: NEGATIVE mg/dL
Hgb urine dipstick: NEGATIVE
Ketones, ur: NEGATIVE mg/dL
Leukocytes,Ua: NEGATIVE
Nitrite: NEGATIVE
Protein, ur: 30 mg/dL — AB
Specific Gravity, Urine: 1.033 — ABNORMAL HIGH (ref 1.005–1.030)
pH: 5 (ref 5.0–8.0)

## 2023-06-24 LAB — TROPONIN I (HIGH SENSITIVITY)
Troponin I (High Sensitivity): 3 ng/L (ref <18)
Troponin I (High Sensitivity): 4 ng/L (ref <18)

## 2023-06-24 LAB — D-DIMER, QUANTITATIVE: D-Dimer, Quant: <0.27 ug{FEU}/mL (ref 0.00–0.50)

## 2023-06-24 LAB — I-STAT CG4 LACTIC ACID, ED: Lactic Acid, Venous: 0.8 mmol/L (ref 0.5–1.9)

## 2023-06-24 MED ORDER — ACETAMINOPHEN 325 MG PO TABS
650.0000 mg | ORAL_TABLET | Freq: Once | ORAL | Status: AC
  Administered 2023-06-24: 650 mg via ORAL
  Filled 2023-06-24: qty 2

## 2023-06-24 MED ORDER — SODIUM CHLORIDE 0.9 % IV BOLUS
1000.0000 mL | Freq: Once | INTRAVENOUS | Status: AC
  Administered 2023-06-24: 1000 mL via INTRAVENOUS

## 2023-06-24 NOTE — ED Triage Notes (Signed)
 Pt reports recent D&C and hysteroscopy. Pt reports SHOB, weakness, and chills. Denies fevers.

## 2023-06-24 NOTE — ED Notes (Signed)
 EDP at bedside

## 2023-06-24 NOTE — ED Provider Notes (Signed)
 [place] EMERGENCY DEPARTMENT AT [facility] Provider Note   CSN: [id number] Arrival date & time: 06/24/23  1432     History Chief Complaint  Patient presents with   Weakness    Jordan Mayer is a 53 y.o. female. Patient with past history significant for depression, lupus presents to the ED with concerns of weakness. She reports that she had a recent D&C with hysteroscopy about 10 days ago with Dr. Diedre. She states that she has been experiencing some shortness of breath, weakness, and chills for several days. Was started on levafloxacin by OBGYN after UA had some concerns for infection. Urine culture pending. No significant abdominal tenderness.  Weakness      Home Medications Prior to Admission medications   Medication Sig Start Date End Date Taking? Authorizing Provider  acetaminophen  (TYLENOL ) 325 MG tablet Take 2 tablets (650 mg total) by mouth every 6 (six) hours as needed for mild pain (pain score 1-3). 06/15/23   Marinone, Michelle E, MD  Cholecalciferol (D3 PO) Take by mouth. 2000 mg daily    [provider]  EPINEPHrine  0.3 mg/0.3 mL IJ SOAJ injection  12/27/17   [provider]  Galcanezumab -gnlm (EMGALITY ) 120 MG/ML SOAJ Inject 120 mg into the skin every 30 (thirty) days. 02/02/23   Whitfield Raisin, NP  hydroxychloroquine  (PLAQUENIL ) 200 MG tablet Take 200mg  by mouth twice daily, Monday through Friday only. None on Saturday or Sunday. 08/18/22   Cheryl Waddell HERO, PA-C  ibuprofen  (ADVIL ) 200 MG tablet Take 3 tablets (600 mg total) by mouth every 6 (six) hours as needed for cramping or moderate pain (pain score 4-6). 06/15/23   Marinone, Michelle E, MD  Multiple Vitamin (MULTIVITAMIN) tablet Take 1 tablet by mouth daily.    [provider]  ondansetron  (ZOFRAN ) 4 MG tablet Take 1 tablet (4 mg total) by mouth daily as needed for nausea or vomiting. 12/30/22 12/30/23  Delia Ole ORN, NP  Semaglutide-Weight Management (WEGOVY) 1.7  MG/0.75ML SOAJ Inject 1.7 mg into the skin once a week.    [provider]  traZODone  (DESYREL ) 100 MG tablet Take 2 tablets (200 mg total) by mouth at bedtime. 12/30/22   Delia Ole ORN, NP  UBRELVY  100 MG TABS Take by mouth as needed. 08/03/22   [provider]  venlafaxine  (EFFEXOR ) 37.5 MG tablet Take 1 tablet (37.5 mg total) by mouth 2 (two) times daily. 12/30/22   Delia Ole ORN, NP      Allergies    Contrast media [iodinated contrast media], Iohexol , Penicillins, and Gadolinium derivatives    Review of Systems   Review of Systems  Neurological:  Positive for weakness.  All other systems reviewed and are negative.   Physical Exam Updated Vital Signs BP (!) 103/53 (BP Location: Right Arm)   Pulse 99   Temp 98.3 F (36.8 C) (Oral)   Resp 19   LMP  (LMP Unknown)   SpO2 100%  Physical Exam Vitals and nursing note reviewed.  Constitutional:      General: She is not in acute distress.    Appearance: She is well-developed. She is not ill-appearing.  HENT:     Head: Normocephalic and atraumatic.  Eyes:     Conjunctiva/sclera: Conjunctivae normal.  Cardiovascular:     Rate and Rhythm: Normal rate and regular rhythm.     Heart sounds: No murmur heard. Pulmonary:     Effort: Pulmonary effort is normal. No respiratory distress.     Breath sounds: Normal  breath sounds.  Abdominal:     Palpations: Abdomen is soft.     Tenderness: There is no abdominal tenderness.  Musculoskeletal:        General: No swelling.     Cervical back: Neck supple.  Skin:    General: Skin is warm and dry.     Capillary Refill: Capillary refill takes less than 2 seconds.  Neurological:     Mental Status: She is alert.  Psychiatric:        Mood and Affect: Mood normal.     ED Results / Procedures / Treatments   Labs (all labs ordered are listed, but only abnormal results are displayed) Labs Reviewed  BASIC METABOLIC PANEL - Abnormal; Notable for the following  components:      Result Value   Creatinine, Ser 1.19 (*)    GFR, Estimated 55 (*)    All other components within normal limits  CBC WITH DIFFERENTIAL/PLATELET - Abnormal; Notable for the following components:   RBC 5.14 (*)    Monocytes Absolute 1.1 (*)    All other components within normal limits  URINALYSIS, ROUTINE W REFLEX MICROSCOPIC - Abnormal; Notable for the following components:   Color, Urine AMBER (*)    APPearance HAZY (*)    Specific Gravity, Urine 1.033 (*)    Bilirubin Urine SMALL (*)    Protein, ur 30 (*)    Bacteria, UA RARE (*)    All other components within normal limits  RESP PANEL BY RT-PCR (RSV, FLU A&B, COVID)  RVPGX2  D-DIMER, QUANTITATIVE  I-STAT CG4 LACTIC ACID, ED  TROPONIN I (HIGH SENSITIVITY)  TROPONIN I (HIGH SENSITIVITY)    EKG EKG Interpretation Date/Time:  Friday June 24 2023 15:53:18 EST Ventricular Rate:  101 PR Interval:  142 QRS Duration:  82 QT Interval:  328 QTC Calculation: 425 R Axis:   61  Text Interpretation: Sinus tachycardia Nonspecific T wave abnormality Abnormal ECG When compared with ECG of 02-Jun-2021 22:38, PREVIOUS ECG IS PRESENT when compared to prior, faster rate.; No STEMI Confirmed by Ginger Barefoot ([id number]) on 06/24/2023 7:52:11 PM  Radiology DG Chest 2 View Result Date: 06/24/2023 CLINICAL DATA:  Shortness of breath EXAM: CHEST - 2 VIEW COMPARISON:  08/30/2014 FINDINGS: The heart size and mediastinal contours are within normal limits. Both lungs are clear. The visualized skeletal structures are unremarkable. IMPRESSION: No active cardiopulmonary disease. Electronically Signed   By: Franky Crease M.D.   On: 06/24/2023 17:12    Procedures Procedures    Medications Ordered in ED Medications  acetaminophen  (TYLENOL ) tablet 650 mg (650 mg Oral Given 06/24/23 1605)  sodium chloride  0.9 % bolus 1,000 mL (0 mLs Intravenous Stopped 06/24/23 2306)    ED Course/ Medical Decision Making/ A&P                                  Medical Decision Making  This patient presents to the ED for concern of weakness. Differential diagnosis includes sepsis, UTI, viral URI, pneumonia   Lab Tests:  I Ordered, and personally interpreted labs.  The pertinent results include:  CBC unremarkable, BMP with slight decline in renal function with GFR at 55 possibly due to dehydration, UA negative for infection with signs of dehydration with increased specific graviity, lactic acid normal, respiratory panel negative, troponin normal at 3 with no significant change on repeat at 4, d-dimer negative   Imaging Studies ordered:  I ordered  imaging studies including chest xray  I independently visualized and interpreted imaging which showed no evidence of any acute cardiopulmonary process seen. I agree with the radiologist interpretation   Medicines ordered and prescription drug management:  I ordered medication including fluids, Tylenol  for weakness, dehydration, pain  Reevaluation of the patient after these medicines showed that the patient improved I have reviewed the patients home medicines and have made adjustments as needed   Problem List / ED Course:  Patient with past history of lupus and depression presents to the ED with concerns of weakness. She reports that she is about 10 days post-op from [facility] and hysteroscopy with OBGYN, Dr. Diedre. She was evaluated several days post-op and diagnosed with a UTI and started of levofloxacin . No significant improvement since then. No recent fevers, chills, bodyaches, nausea, vomiting, or diarrhea. Denies any abdominal pain or tenderness at this time. On exam, no acute focal abnormalities. No evidence of abdominal tenderness or distension. Capillary refill slightly delayed at 3 seconds, indicating some level of dehydration. Will proceed with labs, EKG, and chest xray for assessment. Labs are notable for signs of mild dehydration, but no acutely concerning results. Will provide fluids and  Tylenol  and reassess. D-dimer negative ruling out PE particularly with no symptoms to suggest PE. Troponin is negative and flat on repeat. No significant electrolyte disturbance, but GFR decreased and urine specific gravity increased suggesting likely mild dehydration.  Informed patient of reassuring workup at this time. States she does not feel a pelvic exam is needed as she is not having any distressing symptoms in pelvis and had exam performed by OBGYN recently. Unclear etiology of weakness at this time beyond noted dehydration.  Encouraged patient to follow up with her OBGYN for repeat assessment if symptoms fail to improve. Discussed strict return precautions. Patient otherwise stable at this time for discharge and outpatient follow up.  Final Clinical Impression(s) / ED Diagnoses Final diagnoses:  Weakness  Dehydration    Rx / DC Orders ED Discharge Orders     None         Cecily Legrand LABOR, PA-C 06/26/23 1307    Tegeler, Lonni PARAS, MD 06/26/23 [phone]

## 2023-06-24 NOTE — Discharge Instructions (Signed)
 You were seen in the ER today with concerns of weakness. Your labs showed signs of mild dehydration and you were provided with fluids. The rest of your labs and imaging were thankfully reassuring. I am unsure if your weakness is due to the dehydration itself or any other cause. I would recommend reaching out to your OBGYN for further evaluation if your symptoms are not improving.

## 2023-06-24 NOTE — ED Provider Triage Note (Signed)
 Emergency Medicine Provider Triage Evaluation Note  [name] , a 53 y.o. female  was evaluated in triage.  Pt complains of Recent D&C.  Review of Systems  Positive: SHOB, Weakness, chills Negative: Fever, chest pain, headache, tobacco use  Physical Exam  BP (!) 130/110 (BP Location: Right Arm)   Pulse 98   Temp 98.2 F (36.8 C)   Resp (!) 22   LMP  (LMP Unknown)   SpO2 100%  Gen:   Awake,  Resp:  Normal effort mildly tachypneic MSK:   Moves extremities without difficulty  Other:    Medical Decision Making  Medically screening exam initiated at 3:43 PM.  Appropriate orders placed.  Jordan Mayer was informed that the remainder of the evaluation will be completed by another provider, this initial triage assessment does not replace that evaluation, and the importance of remaining in the ED until their evaluation is complete.  Labs and imaging ordered   Jordan Mayer 06/24/23 8451

## 2023-06-28 LAB — LAB REPORT - SCANNED: EGFR: 60

## 2023-06-29 NOTE — Telephone Encounter (Signed)
Pharmacy Patient Advocate Encounter  Received notification from [facility] that Prior Authorization for Elyxyb 120MG /4.8ML solution  has been This request was denied because you did not meet the following requirements: The requested medication and/or diagnosis are not a covered benefit and excluded from coverage in accordance with the terms and conditions of your plan benefit. Therefore, the request has been administratively denied. If applicable, below are some alternative medications to consider: CELECOXIB   PA #/Case ID/Reference #: [id number]

## 2023-07-01 NOTE — Progress Notes (Signed)
Creatinine 1.11

## 2023-07-12 ENCOUNTER — Ambulatory Visit: Payer: 59 | Admitting: Neurology

## 2023-07-18 ENCOUNTER — Ambulatory Visit: Payer: 59 | Admitting: Neurology

## 2023-07-25 ENCOUNTER — Telehealth: Payer: Self-pay | Admitting: Adult Health

## 2023-07-25 MED ORDER — UBRELVY 100 MG PO TABS: 100.0000 mg | ORAL_TABLET | ORAL | 0 refills | Status: DC | PRN

## 2023-07-25 NOTE — Telephone Encounter (Signed)
Rx sent,

## 2023-07-25 NOTE — Telephone Encounter (Signed)
 Last seen on 02/01/23 per note " Rescue: try Zomig nasal spray for more severe migraines, may be beneficial for migraines associated with barometric pressure changes.  Continue Ubrelvy PRN "  Follow up scheduled on 09/20/23  I don't see Rx for Bernita Raisin, its only listed in Historical provider.  Please advise

## 2023-07-25 NOTE — Telephone Encounter (Signed)
 Per plan, continue Ubrelvy PRN and to try Zomig for more severe migraines. Please place order for Ubrelvy. Thank you.

## 2023-07-25 NOTE — Telephone Encounter (Addendum)
 Pt states that this medicaion Jordan Mayer was prescribed by Shanda Bumps, NP. Pt is asking if Shanda Bumps, NP will write another Rx for her Jordan Mayer until she see's Dr Vickey Huger in May

## 2023-07-27 IMAGING — US US BREAST*L* LIMITED INC AXILLA
1 series · 5 of 5 positions shown · non-contrast
Comparison: Previous exam(s).

CLINICAL DATA: Left breast asymmetry seen in the oblique projection
on a recent screening mammogram.

EXAM:
DIGITAL DIAGNOSTIC UNILATERAL LEFT MAMMOGRAM WITH TOMOSYNTHESIS AND
CAD; ULTRASOUND LEFT BREAST LIMITED
TECHNIQUE: Left digital diagnostic mammography and breast tomosynthesis was
performed. The images were evaluated with computer-aided detection.;
Targeted ultrasound examination of the left breast was performed.

[Series 1: us breast*left* limited inc axilla · 0.06mm/px · 5 of 5 slices shown]
[im 1/5]
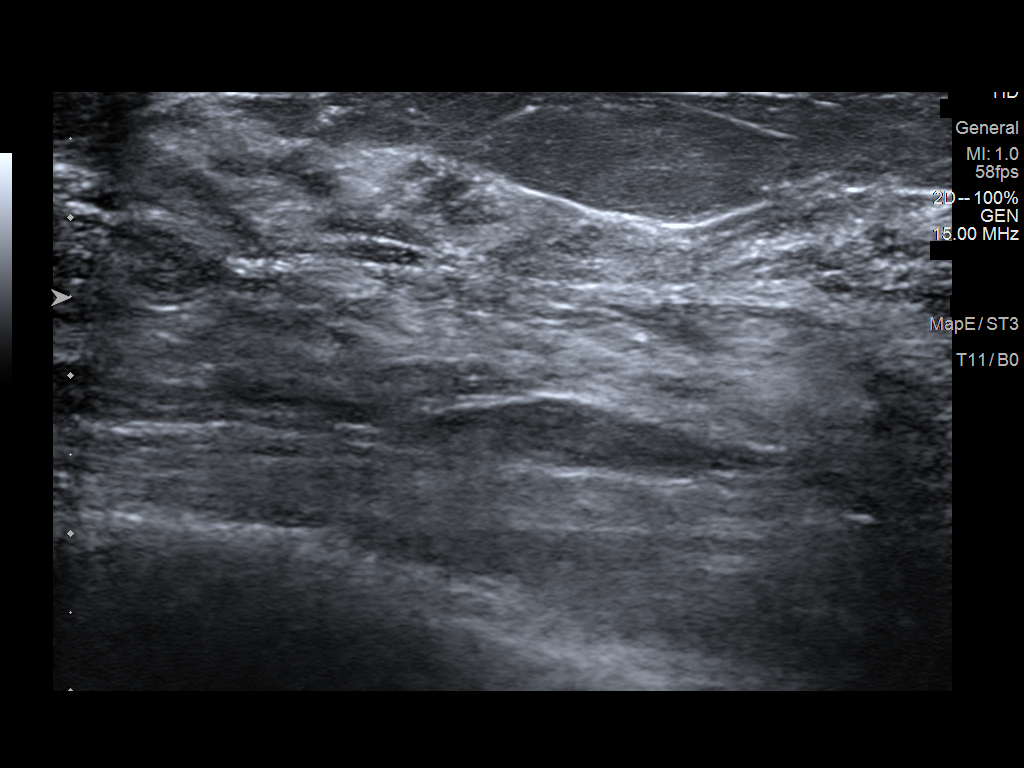
[im 2/5]
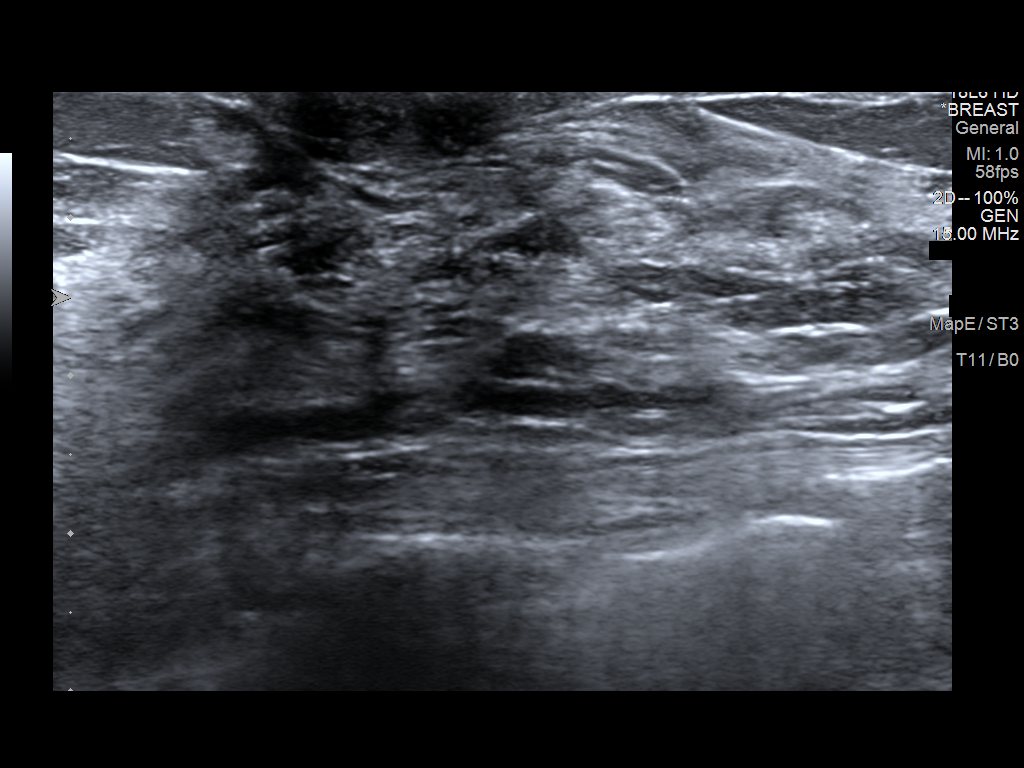
[im 3/5]
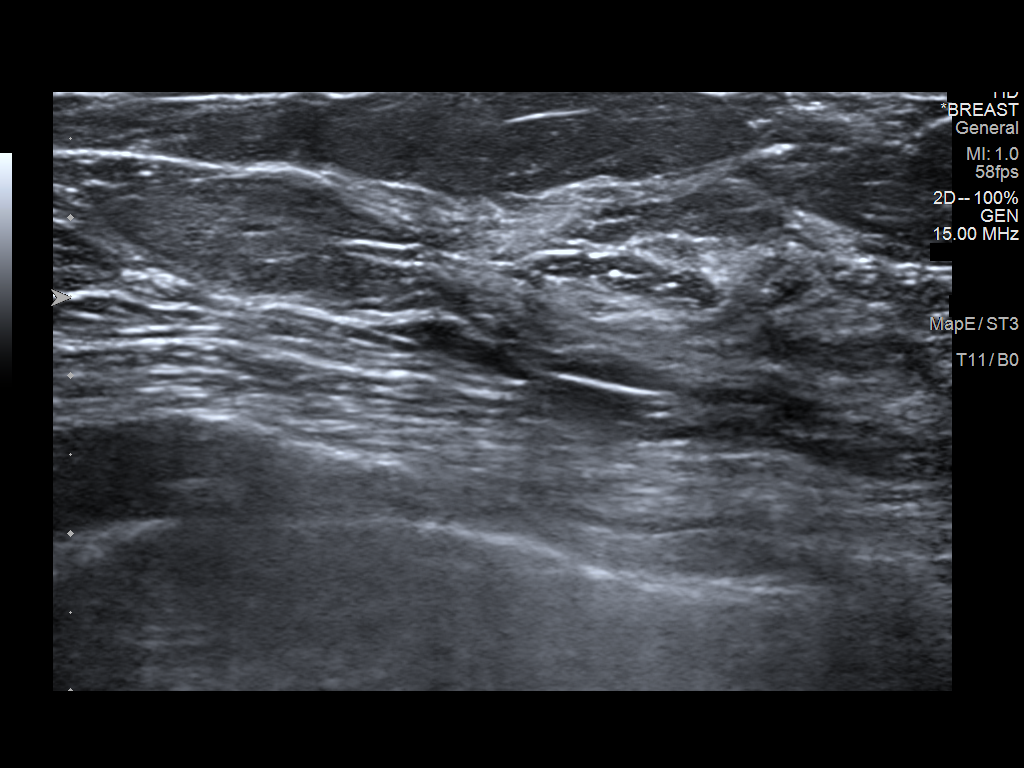
[im 4/5]
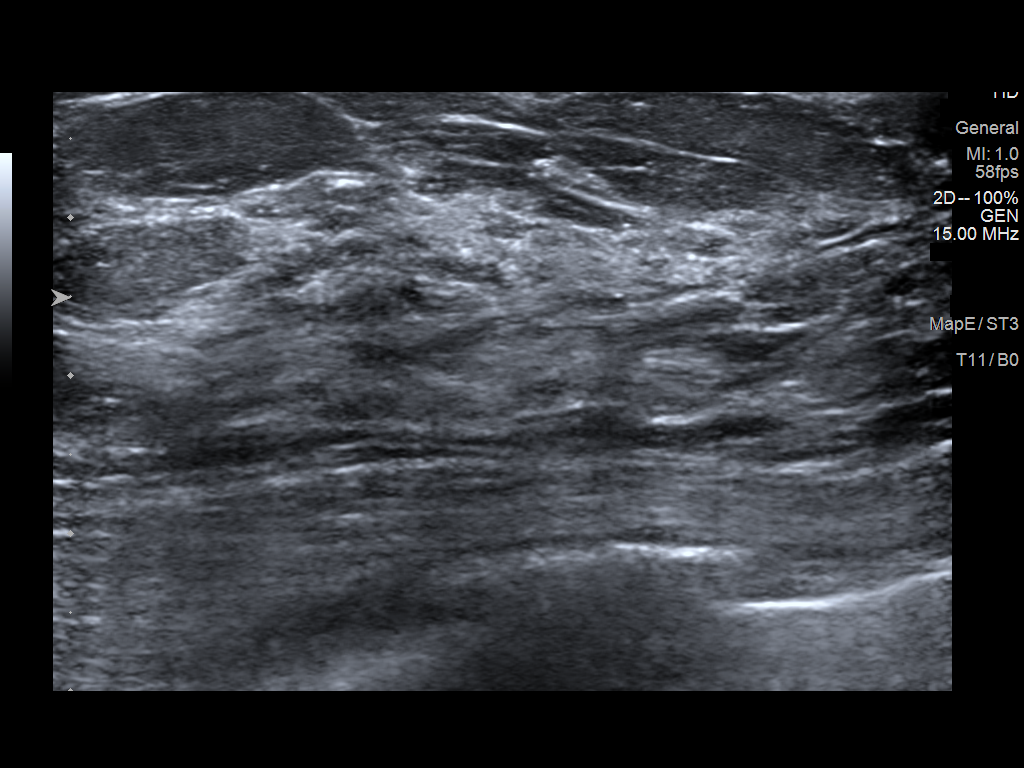
[im 5/5]
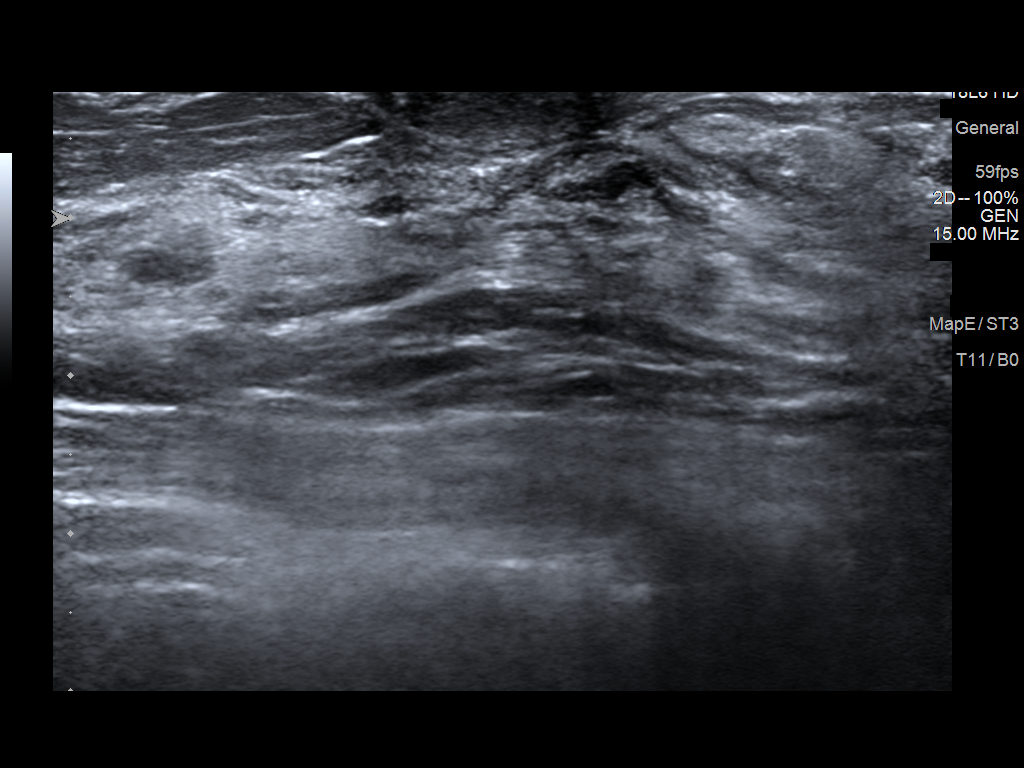

[5 of 5 positions shown; findings below may reference images not displayed]

ACR Breast Density Category d: The breast tissue is extremely dense,
which lowers the sensitivity of mammography.
FINDINGS: 3D tomographic and 2D generated true lateral and spot compression
oblique images of the left breast were obtained. Normal appearing
fibroglandular tissue is demonstrated at the location of the
recently suspected asymmetry in the central left breast in the
oblique projection on today's spot compression images. There is a
suggestion of an oval corresponding asymmetry with some
circumscribed and some indistinct margins in the true lateral
projection. This has an appearance similar to adjacent
fibroglandular tissue. No corresponding abnormality was present in
the craniocaudal projection on the recent screening mammogram.

On physical exam, no mass is palpable in the central left breast.

Targeted ultrasound is performed, showing normal appearing breast
tissue throughout the central left breast.
IMPRESSION: No evidence of malignancy. The recently suspected left breast
asymmetry was close apposition of normal breast tissue.

RECOMMENDATION:
Bilateral screening mammogram in 1 year when due.

I have discussed the findings and recommendations with the patient.
If applicable, a reminder letter will be sent to the patient
regarding the next appointment.

BI-RADS CATEGORY  1: Negative.

## 2023-07-27 IMAGING — MG MM DIGITAL DIAGNOSTIC UNILAT*L* W/ TOMO W/ CAD
4 series · 4 of 12 positions shown · non-contrast
Comparison: Previous exam(s).

CLINICAL DATA: Left breast asymmetry seen in the oblique projection
on a recent screening mammogram.

EXAM:
DIGITAL DIAGNOSTIC UNILATERAL LEFT MAMMOGRAM WITH TOMOSYNTHESIS AND
CAD; ULTRASOUND LEFT BREAST LIMITED
TECHNIQUE: Left digital diagnostic mammography and breast tomosynthesis was
performed. The images were evaluated with computer-aided detection.;
Targeted ultrasound examination of the left breast was performed.

[L ML synth-2D]
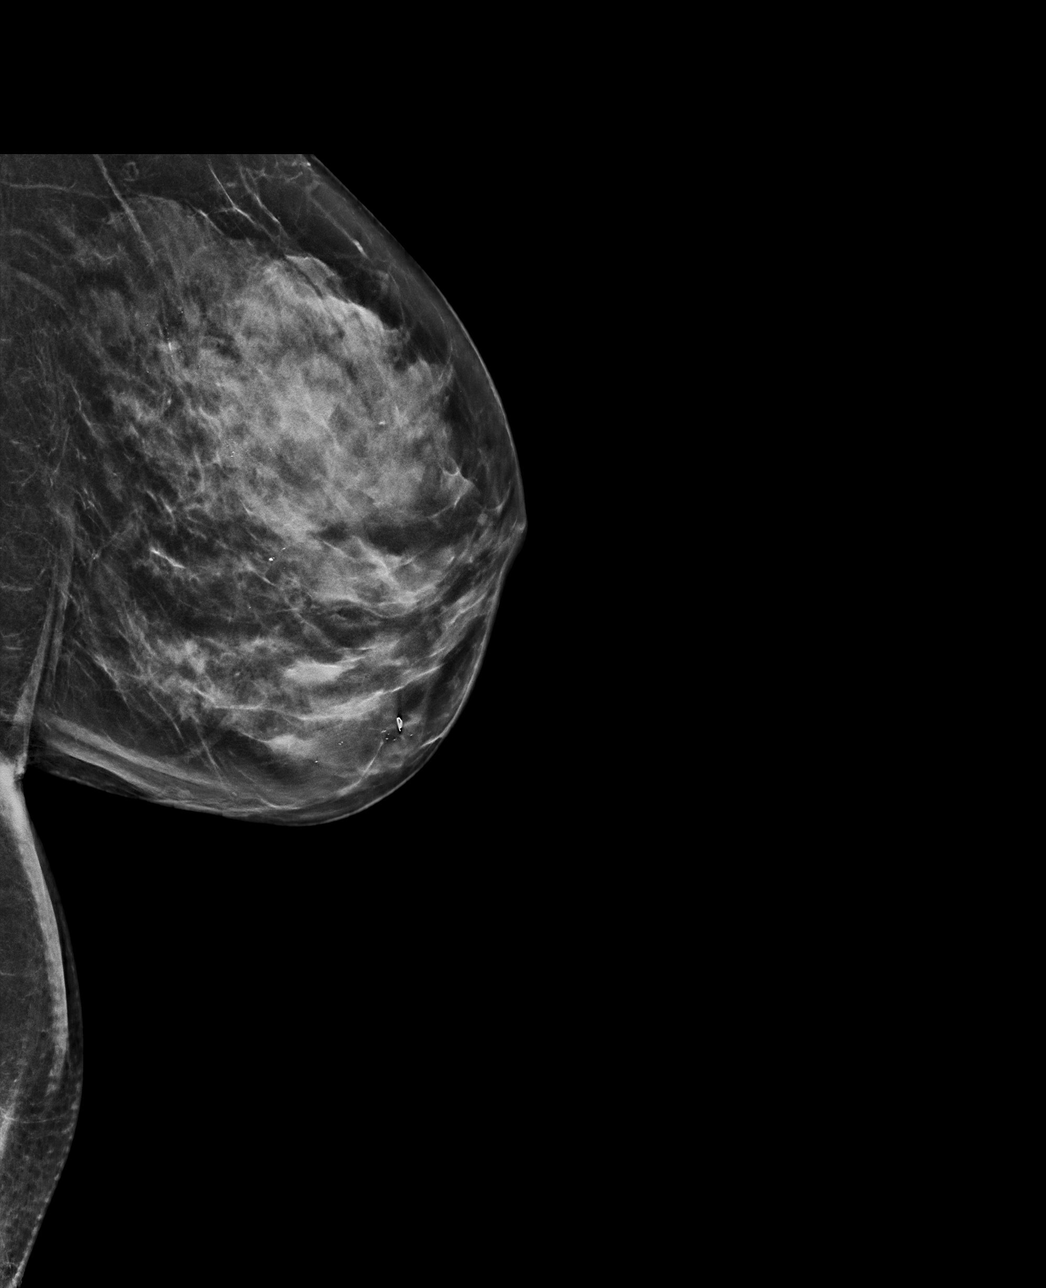

[L MLO synth-2D]
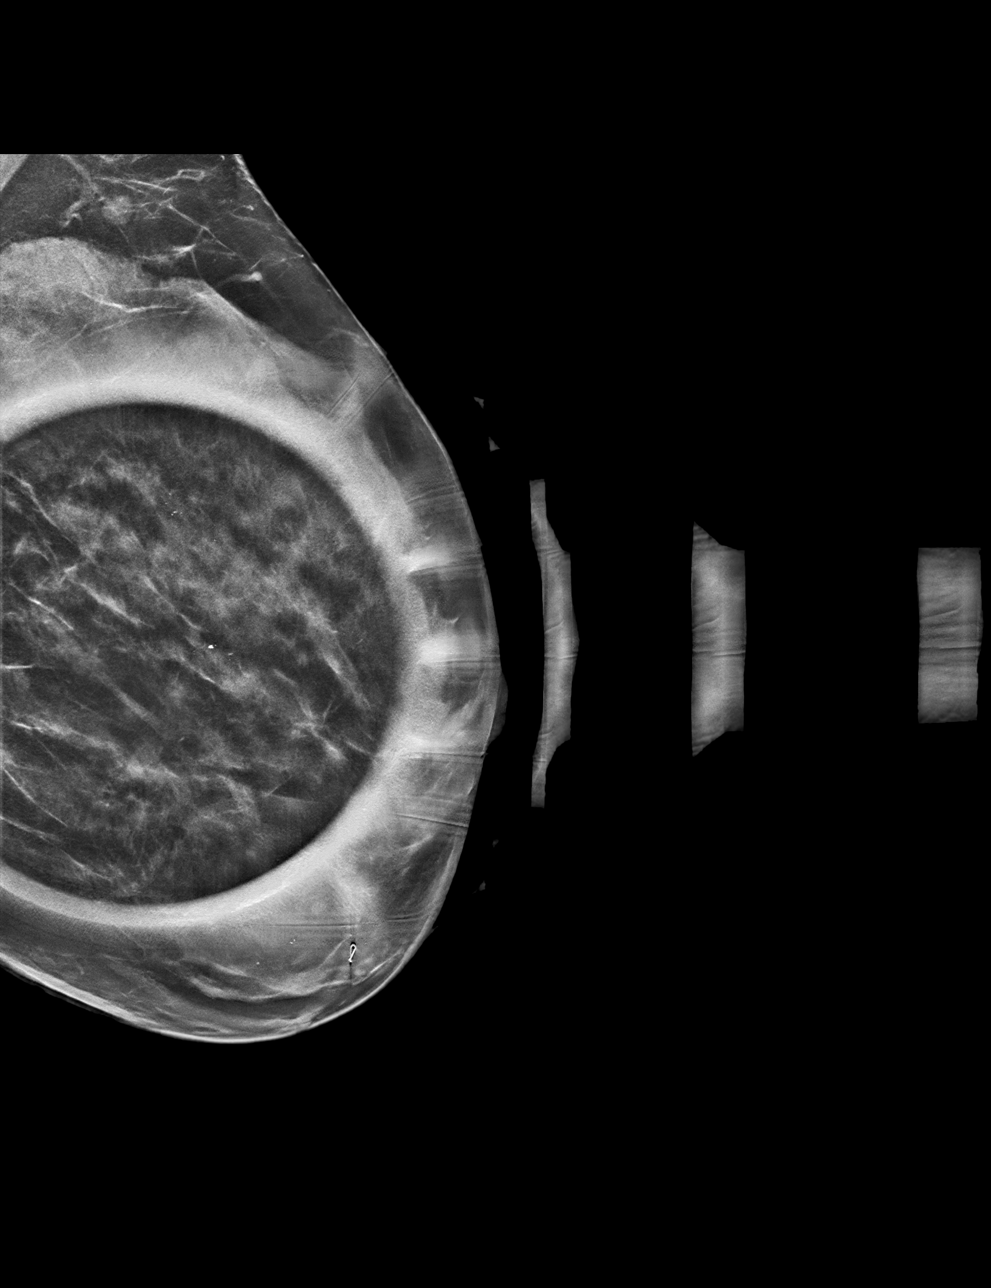

[L ML tomo · tomo slice 32/63.0]
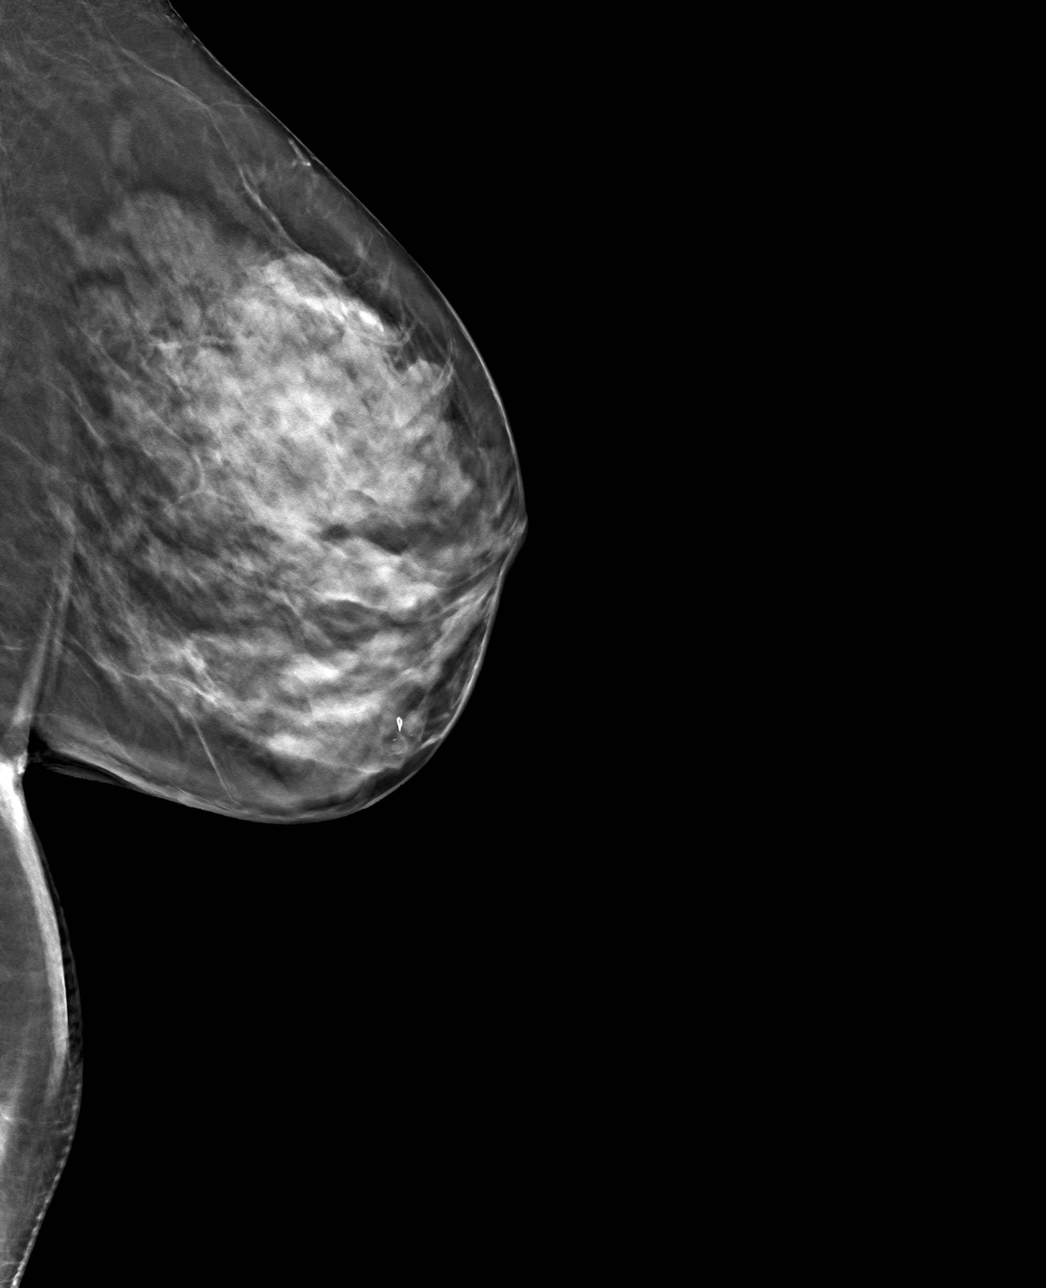

[L MLO tomo · tomo slice 27/54.0]
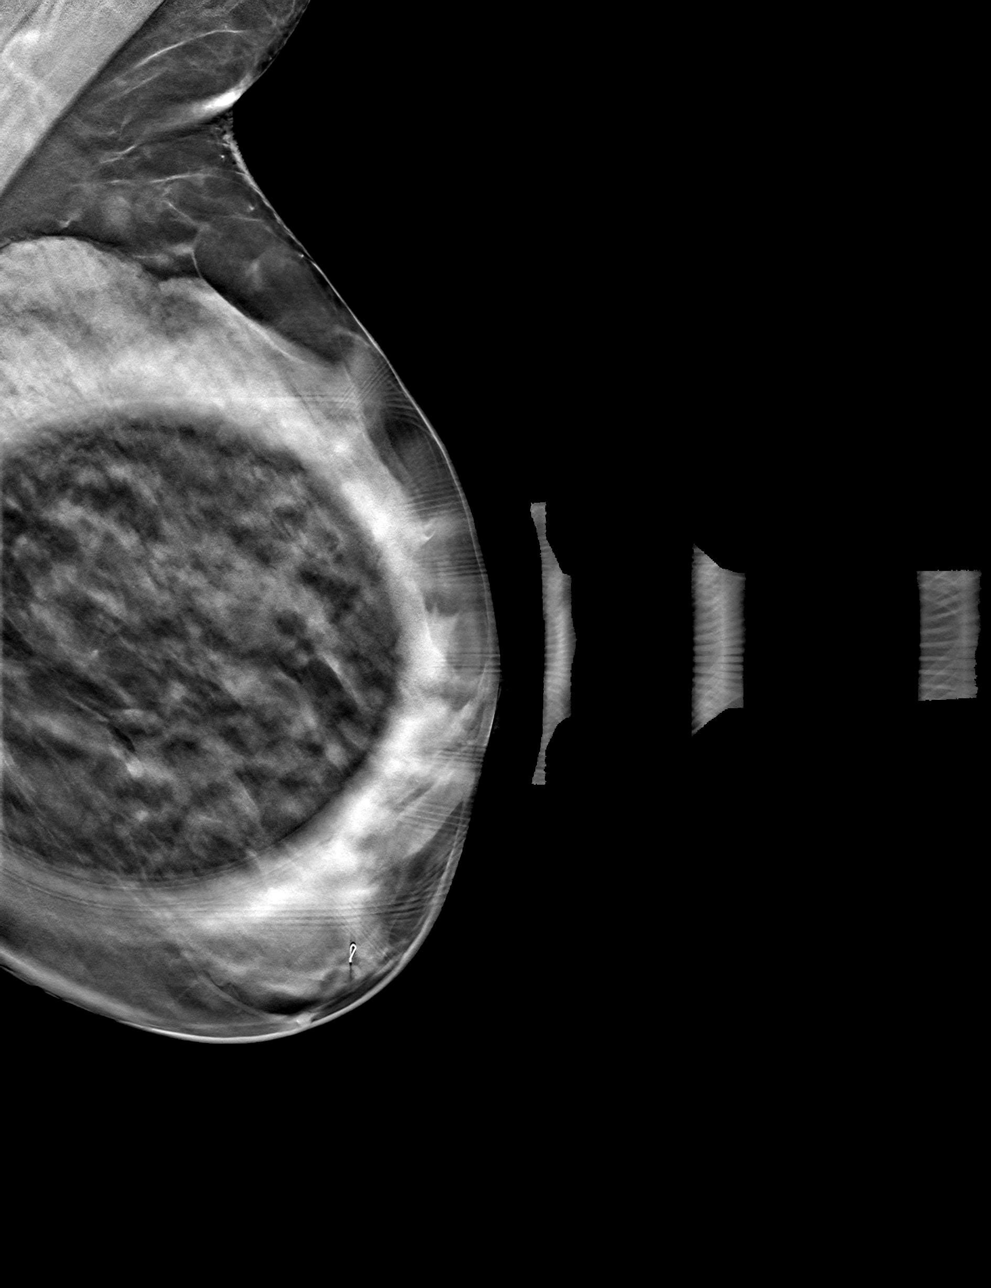

[4 of 12 positions shown; findings below may reference images not displayed]

ACR Breast Density Category d: The breast tissue is extremely dense,
which lowers the sensitivity of mammography.
FINDINGS: 3D tomographic and 2D generated true lateral and spot compression
oblique images of the left breast were obtained. Normal appearing
fibroglandular tissue is demonstrated at the location of the
recently suspected asymmetry in the central left breast in the
oblique projection on today's spot compression images. There is a
suggestion of an oval corresponding asymmetry with some
circumscribed and some indistinct margins in the true lateral
projection. This has an appearance similar to adjacent
fibroglandular tissue. No corresponding abnormality was present in
the craniocaudal projection on the recent screening mammogram.

On physical exam, no mass is palpable in the central left breast.

Targeted ultrasound is performed, showing normal appearing breast
tissue throughout the central left breast.
IMPRESSION: No evidence of malignancy. The recently suspected left breast
asymmetry was close apposition of normal breast tissue.

RECOMMENDATION:
Bilateral screening mammogram in 1 year when due.

I have discussed the findings and recommendations with the patient.
If applicable, a reminder letter will be sent to the patient
regarding the next appointment.

BI-RADS CATEGORY  1: Negative.

## 2023-08-05 ENCOUNTER — Other Ambulatory Visit (HOSPITAL_COMMUNITY): Payer: Self-pay

## 2023-08-05 ENCOUNTER — Telehealth: Payer: Self-pay

## 2023-08-05 NOTE — Telephone Encounter (Signed)
 Pharmacy Patient Advocate Encounter   Received notification from Fax that prior authorization for Ubrelvy 100MG  tablets is required/requested.   Insurance verification completed.   The patient is insured through [facility] .   Per test claim: PA required; PA submitted to above mentioned insurance via CoverMyMeds Key/confirmation #/EOC [id number] Status is pending

## 2023-08-08 ENCOUNTER — Other Ambulatory Visit (HOSPITAL_COMMUNITY): Payer: Self-pay

## 2023-08-08 NOTE — Telephone Encounter (Signed)
 Pharmacy Patient Advocate Encounter  Received notification from [facility] that Prior Authorization for Ubrelvy 100MG  tablets has been APPROVED from 08/05/2023 to 08/04/2024. Ran test claim, Copay is $0. This test claim was processed through [facility]- copay amounts may vary at other pharmacies due to pharmacy/plan contracts, or as the patient moves through the different stages of their insurance plan.   PA #/Case ID/Reference #: PA Case ID #: [id number]

## 2023-08-15 ENCOUNTER — Ambulatory Visit: Payer: 59 | Admitting: Neurology

## 2023-08-15 ENCOUNTER — Telehealth: Payer: Self-pay | Admitting: Adult Health

## 2023-08-15 NOTE — Telephone Encounter (Signed)
 Pt's spouse, Adalaya Irion called on be half of wife. Patient having a migraine today. Have an appt rescheduled May. Asking if can get a bridge for UBRELVY 100 MG TABS until appt. Can send to  [id number]

## 2023-08-15 NOTE — Telephone Encounter (Signed)
 Spoke w/Pt spouse regarding Bernita Raisin. Spouse stated he did thought Pt did not have a new script. Informed spouse a script for Bernita Raisin for one month was sent to pharmacy on record on 07/25/23. Spouse stated he thought that script had been picked up but was not sure. Informed spouse if it had been picked up Pt should have medication until 08/25/23. Spouse stated he would reach out to pharmacy. Stated thankful for call.

## 2023-08-18 ENCOUNTER — Telehealth: Payer: Self-pay | Admitting: Adult Health

## 2023-08-18 NOTE — Telephone Encounter (Signed)
 Spouse reporting that a PA is needed for  UBRELVY 100 MG TABS

## 2023-08-18 NOTE — Telephone Encounter (Signed)
 Looks like PA Ubrelvy already completed:     Pt last refilled 04/05/23 #8.  Rx recently sent 07/25/23.    Called [facility] at [phone]. Spoke w/ tech. 16/30 rejected, they will cover 8/30 days. They will process rx for this instead and get ready for pt.   I called husband, Kathlene November and provided update. He verbalized understanding.

## 2023-08-24 ENCOUNTER — Telehealth: Payer: 59 | Admitting: Adult Health

## 2023-09-05 ENCOUNTER — Ambulatory Visit: Payer: 59 | Admitting: Neurology

## 2023-09-20 ENCOUNTER — Ambulatory Visit (INDEPENDENT_AMBULATORY_CARE_PROVIDER_SITE_OTHER): Admitting: Neurology

## 2023-09-20 ENCOUNTER — Encounter: Payer: Self-pay | Admitting: Neurology

## 2023-09-20 VITALS — BP 114/82 | Ht 65.0 in | Wt 164.2 lb

## 2023-09-20 DIAGNOSIS — I951 Orthostatic hypotension: Secondary | ICD-10-CM | POA: Diagnosis not present

## 2023-09-20 DIAGNOSIS — M3219 Other organ or system involvement in systemic lupus erythematosus: Secondary | ICD-10-CM | POA: Diagnosis not present

## 2023-09-20 DIAGNOSIS — R768 Other specified abnormal immunological findings in serum: Secondary | ICD-10-CM | POA: Diagnosis not present

## 2023-09-20 DIAGNOSIS — R42 Dizziness and giddiness: Secondary | ICD-10-CM

## 2023-09-20 DIAGNOSIS — R278 Other lack of coordination: Secondary | ICD-10-CM

## 2023-09-20 MED ORDER — UBRELVY 100 MG PO TABS: 100.0000 mg | ORAL_TABLET | ORAL | 0 refills | Status: DC | PRN

## 2023-09-20 MED ORDER — ONDANSETRON 8 MG PO TBDP: 8.0000 mg | ORAL_TABLET | Freq: Two times a day (BID) | ORAL | 1 refills | Status: AC

## 2023-09-20 MED ORDER — EMGALITY 120 MG/ML ~~LOC~~ SOAJ: 120.0000 mg | SUBCUTANEOUS | 11 refills | Status: DC

## 2023-09-20 NOTE — Progress Notes (Addendum)
 Provider:  Neomia Banner, MD  Primary Care Physician:  Reginal Capra, MD [address] [place] [address]     Referring Provider: Reginal Capra, Md [address] [place],  [place] [address]          Chief Complaint according to patient   Patient presents with:          TOC from Dr Billy Bue who followed for Migraine , seen by Johny Nap, NP. Here seen for a host of other issues,  Lupus dx  2023.  Brain fog, vertigo , neck pain. Near syncope.       HISTORY OF PRESENT ILLNESS:  Jordan Mayer is a 53 y.o. female  and new to me -patient,  who is here for a TOC visit officially for migraine on 09/20/2023.  Chief concern according to patient :  " I am spending all my time in bed, with neck pain, pre syncopal, I have to brace myself all the time, and I feel the room is spinning around me , I am nauseated , lightheaded". The vertigo is counterclockwise.  Worse when standing up- "sometimes suddenly happening when I already walked a  couple of steps, may fall  backwards." The patient is orthostatic here, her BP was very low and she appeared dehydrated :    Seated position, left arm  the blood pressure was 98/ 80 with a regular heart rate, this (I suppose) was taken on the left arm.   The BP in supine was 114/82  with the legs raised, and 100/80 when flat,  and standing blood pressure was again low at 84/68 mmHg. Regular rate.   Vertigo sensation increased with eyes closed.   Interval history of 2 more kidney stone related painful events and 2 renal procedures in the last 12 months. She has changed rheumatologist , now with Dr. Ebbie Goldmann. Has several specialists outside of [facility] health.     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 " MRI 03-16-2023 though Dr Janett Medin, MD :  The cervical vertebrae demonstrate slight loss of forward lordotic curvature but normal body height and marrow signal characteristics. The intervertebral disc show minor  disc signal abnormalities but without frank discrimination cord compression significant root or foraminal encroachment. There are minor facet degenerative changes noted but without significant foraminal stenosis or root impingement.   2) Had seen vestibular rehab and was deemed not a candidate.   Prior visit: 01/04/2022 with Dr. Billy Bue No abnormal lesions are seen on diffusion-weighted views to suggest acute ischemia. The cortical sulci, fissures and cisterns are normal in size and appearance. Lateral, third and fourth ventricle are normal in size and appearance. No extra-axial fluid collections are seen. No evidence of mass effect or midline shift.     On sagittal views the posterior fossa, pituitary gland and corpus callosum are notable for enlarged partially empty sella. No evidence of intracranial hemorrhage on gradient-echo views. The orbits and their contents, paranasal sinuses and calvarium are notable for mucus thickening in the ethmoid and maxillary sinuses. Intracranial flow voids are present. IMPRESSION: Unremarkable MRI brain without contrast.  No acute findings. Omega Bible, MD   Brief HPI: Jordan Mayer is a 53 y.o. married  Caucasian right handed female who is being seen for follow-up regarding worsening migraine headaches reporting migraines every day at initial visit on 01/04/2022.    At prior visit, she  was started on propranolol  for prevention and Nurtec for rescue although after about 1 month, no benefit on propranolol  therefore discontinued and started on Emgality  monthly injection.  Completed MRI brain which was unremarkable. C/o blurred vision, was referred to ophthalmology.    Reports improvement of migraine headaches since initiating Emgality  currently experiencing 2 migraine days per month, can worsen with storms.  Will use Nurtec without any great benefit but the next day will have more fatigue type symptoms. Migraines can last at least 24 hours.    She does  complain of vertigo type symptoms over the past several months. She does question if underlying rheumatologic diseases are contributing and use of Plaquenil  as this was started a few months ago, she plans on further discussing with Dr. Alvira Josephs at follow-up visit next week.  Reports vertigo/dizziness sensation worse when walking (will feel like she is swaying from side-to-side), difficulty closing eyes when standing, mild vertigo with quick head movements.  No improvement with laying down, still feels like everything is moving.  Denies lightheadedness/syncopal sensation.  Does have chronic neck pain, was told pinched nerve at C5-6, was having issues with right arm radiculopathy but has not experienced the symptoms in several years.           Review of Systems: Out of a complete 14 system review, the patient complains of only the following symptoms, and all other reviewed systems are negative.:   Here on 09-20-2023 with lightheadedness, orthostatic and vertigo reportedly counterclockwise, deemed not to be vestibular in origin . Neck stiffness and pain reported as well .   Social History   Socioeconomic History   Marital status: Married    Spouse name: Bambi Lever   Number of children: 2   Years of education: Not on file   Highest education level: Master's degree (e.g., MA, MS, MEng, MEd, MSW, MBA)  Occupational History   Occupation: Teaching laboratory technician  Tobacco Use   Smoking status: Never    Passive exposure: Never   Smokeless tobacco: Never  Vaping Use   Vaping status: Never Used  Substance and Sexual Activity   Alcohol use: Not Currently   Drug use: No   Sexual activity: Yes    Birth control/protection: None    Comment: husband had vasectomy  Other Topics Concern   Not on file  Social History Narrative   7 hours of sleep per night   Lives with her husband and 2 kids   Does not work/Full Time Volunteer   Has a dog in the home   Caffeine- coffee 1 c but not daily   Social  Drivers of Corporate investment banker Strain: Not on file  Food Insecurity: No Food Insecurity (12/29/2022)   Hunger Vital Sign    Worried About Running Out of Food in the Last Year: Never true    Ran Out of Food in the Last Year: Never true  Transportation Needs: No Transportation Needs (12/29/2022)   PRAPARE - Administrator, Civil Service (Medical): No    Lack of Transportation (Non-Medical): No  Physical Activity: Not on file  Stress: Not on file  Social Connections: Unknown (01/22/2022)   [facility] from [facility], [facility]   Social Network    Social Network: Not on file    Family History  Problem Relation Age of Onset   Varicose Veins Mother    Asthma Mother    Myasthenia gravis Mother        Ocular   Varicose Veins  Sister    Arthritis Maternal Grandmother    Arthritis Paternal Grandfather    Hearing loss Paternal Grandfather    Healthy Son    Healthy Son    Birth defects Paternal Aunt        Cognitively Challenged   Miscarriages / Stillbirths Paternal Aunt    Varicose Veins Paternal Aunt    Varicose Veins Paternal Aunt    Melanoma Paternal Aunt        melanoma x2   Prostate cancer Cousin    Colon cancer Neg Hx    Colon polyps Neg Hx    Esophageal cancer Neg Hx    Rectal cancer Neg Hx    Stomach cancer Neg Hx     Past Medical History:  Diagnosis Date   ADD (attention deficit disorder)    Allergy    Anxiety    Asthma    Chicken pox    as child   Chronic migraines    Concussion    x 2 no residual   Depression    Family history of melanoma    Gallstones    GERD (gastroesophageal reflux disease)    Kidney stones    Lupus    PCOS (polycystic ovarian syndrome)    Pneumonia 2003   Renal disorder    kidney disease   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    Shingles     Past Surgical History:  Procedure Laterality Date   BUNIONECTOMY Right    CHOLECYSTECTOMY     COLONOSCOPY     CYSTOSCOPY W/ URETERAL STENT PLACEMENT Left 12/29/2022   Procedure:  CYSTOSCOPY WITH LEFT RETROGRADE PYELOGRAM/URETERAL STENT PLACEMENT;  Surgeon: Erman Hayward, MD;  Location: [facility];  Service: Urology;  Laterality: Left;   CYSTOSCOPY/URETEROSCOPY/HOLMIUM LASER/STENT PLACEMENT Left 01/05/2023   Procedure: CYSTOSCOPY, LEFT URETEROSCOPY, HOLMIUM LASER LITHOTRIPSY, AND LEFT URETERAL STENT PLACEMENT;  Surgeon: Adelbert Homans, MD;  Location: [facility];  Service: Urology;  Laterality: Left;  45 MINUTES   DILATATION & CURETTAGE/HYSTEROSCOPY WITH MYOSURE N/A 06/15/2023   Procedure: DILATATION & CURETTAGE/HYSTEROSCOPY WITH MYOSURE;  Surgeon: Theodoro Fisherman, MD;  Location: [facility];  Service: Gynecology;  Laterality: N/A;  instrument needed = myosure lite   HIATAL HERNIA REPAIR     with gastric sleeve   LAPAROSCOPIC CHOLECYSTECTOMY SINGLE SITE WITH INTRAOPERATIVE CHOLANGIOGRAM N/A 06/14/2017   Procedure: LAPAROSCOPIC CHOLECYSTECTOMY SINGLE SITE;  Surgeon: Candyce Champagne, MD;  Location: [facility];  Service: General;  Laterality: N/A;   LAPAROSCOPIC GASTRIC SLEEVE RESECTION  09/2013   LASER ABLATION     Vascular   NASAL SINUS SURGERY  12/2021   TONSILLECTOMY  1988   WISDOM TOOTH EXTRACTION       Current Outpatient Medications on File Prior to Visit  Medication Sig Dispense Refill   acetaminophen  (TYLENOL ) 500 MG tablet Take 500 mg by mouth every 6 (six) hours as needed.     Cholecalciferol (D3 PO) Take by mouth. 2000 mg daily     EPINEPHrine  0.3 mg/0.3 mL IJ SOAJ injection      Galcanezumab -gnlm (EMGALITY ) 120 MG/ML SOAJ Inject 120 mg into the skin every 30 (thirty) days. 1 mL 11   hydroxychloroquine  (PLAQUENIL ) 200 MG tablet Take 200mg  by mouth twice daily, Monday through Friday only. None on Saturday or Sunday. 120 tablet 0   Multiple Vitamin (MULTIVITAMIN) tablet Take 1 tablet by mouth daily.     ondansetron  (ZOFRAN -ODT) 8 MG disintegrating tablet Take 8 mg by mouth 2 (two) times daily.  progesterone (PROMETRIUM)  200 MG capsule Take 200 mg by mouth at bedtime.     Semaglutide-Weight Management (WEGOVY) 1.7 MG/0.75ML SOAJ Inject 1.7 mg into the skin once a week.     traZODone  (DESYREL ) 100 MG tablet Take 2 tablets (200 mg total) by mouth at bedtime.     UBRELVY  100 MG TABS Take 1 tablet (100 mg total) by mouth as needed. Can repeat after 2 hours if needed ( not to exceed 2 doses in 24 hours) 16 tablet 0   venlafaxine  (EFFEXOR ) 37.5 MG tablet Take 1 tablet (37.5 mg total) by mouth 2 (two) times daily.     No current facility-administered medications on file prior to visit.    Allergies  Allergen Reactions   Contrast Media [Iodinated Contrast Media] Hives   Iohexol       Code: HIVES, Desc: 1 hive developed on hand post injection of 125cc's Omni 300., Onset Date: [id number]    Penicillins Other (See Comments)    Has patient had a PCN reaction causing immediate rash, facial/tongue/throat swelling, SOB or lightheadedness with hypotension: Yes Has patient had a PCN reaction causing severe rash involving mucus membranes or skin necrosis: No Has patient had a PCN reaction that required hospitalization: No Has patient had a PCN reaction occurring within the last 10 years: No If all of the above answers are "NO", then may proceed with Cephalosporin use.   Gadolinium Derivatives Hives, Itching and Rash     DIAGNOSTIC DATA (LABS, IMAGING, TESTING) - I reviewed patient records, labs, notes, testing and imaging myself where available.  Lab Results  Component Value Date   WBC 6.1 06/24/2023   HGB 14.5 06/24/2023   HCT 42.5 06/24/2023   MCV 82.7 06/24/2023   PLT 267 06/24/2023      Component Value Date/Time   NA 138 06/24/2023 1549   NA 140 07/02/2022 0000   K 3.8 06/24/2023 1549   CL 104 06/24/2023 1549   CO2 22 06/24/2023 1549   GLUCOSE 92 06/24/2023 1549   BUN 14 06/24/2023 1549   BUN 16 07/02/2022 0000   CREATININE 1.19 (H) 06/24/2023 1549   CREATININE 0.94 08/16/2022 0853   CALCIUM 9.8  06/24/2023 1549   CALCIUM 9.2 07/19/2008 0208   PROT 6.5 01/25/2023 1622   ALBUMIN 3.6 01/25/2023 1622   AST 17 01/25/2023 1622   ALT 19 01/25/2023 1622   ALKPHOS 54 01/25/2023 1622   BILITOT 0.4 01/25/2023 1622   GFRNONAA 55 (L) 06/24/2023 1549   GFRAA >60 08/15/2019 1934   Lab Results  Component Value Date   CHOL 187 06/12/2020   HDL 46.60 06/12/2020   LDLCALC 102 (H) 06/12/2020   TRIG 115 06/02/2021   CHOLHDL 4 06/12/2020   Lab Results  Component Value Date   HGBA1C 5.7 (A) 08/25/2022   Lab Results  Component Value Date   VITAMINB12 231 08/18/2022   Lab Results  Component Value Date   TSH 2.478 12/30/2022    PHYSICAL EXAM:  Today's Vitals   09/20/23 1409  BP: 98/80  Weight: 164 lb 3.2 oz (74.5 kg)  Height: 5\' 5"  (1.651 m)   Body mass index is 27.32 kg/m.   Wt Readings from Last 3 Encounters:  09/20/23 164 lb 3.2 oz (74.5 kg)  06/15/23 182 lb 4.8 oz (82.7 kg)  01/25/23 188 lb 3.2 oz (85.4 kg)     Ht Readings from Last 3 Encounters:  09/20/23 5\' 5"  (1.651 m)  06/15/23 5\' 5"  (1.651 m)  01/25/23  5\' 5"  (1.651 m)      General: The patient is awake, alert and appears not in acute distress. The patient is well groomed. Head: Normocephalic, atraumatic. Neck stiffness. Tender paraspinal muscles and shoulders.   Cardiovascular:  Regular rate and cardiac rhythm by pulse. Respiratory:  clear  Skin:  Without evidence of ankle edema, or rash. Trunk: The patient's posture is erect.   NEUROLOGIC EXAM: The patient is awake and alert, oriented to place and time.   Memory subjective described as intact.  Attention span & concentration ability appears normal.  Speech is fluent,  without  dysarthria, dysphonia or aphasia.  Mood and affect are appropriate.   Cranial nerves: no loss of smell or taste reported  Pupils are equal and briskly reactive to light. Funduscopic exam def.  Extraocular movements in vertical and horizontal planes were intact and without  nystagmus. No Diplopia. Visual fields are intact. Hearing was intact to soft voice .    Facial sensation intact.  Facial motor strength is symmetric and tongue and uvula move midline.  Neck ROM : rotation, tilt and flexion extension were normal for age and shoulder shrug was symmetrical.    Motor exam:  Symmetric bulk, tone and ROM.   Normal tone without cog -wheeling, symmetric grip strength .   Sensory:  Fine touch, vibration were normal.  Proprioception tested in the upper extremities was normal.   Coordination: Rapid alternating movements in the fingers/hands were of reduced  speed.  The Finger-to-nose maneuver  with evidence of dysmetria, both index fingers pointed to the right of the nose.  There is no tremor.   Gait and station: Patient could rise unassisted from a seated position, walked without assistive device but very slowly and unsteadily.  Stance is of wider base and the patient turned with 4 steps.  She almost fell when turning.   Noted a drift to the right and retropulsion with standing and walking. Aaron Aas  ROMBERG positive .  Swaying pro- and retro-pulsive  Deep tendon reflexes: in the  upper and lower extremities are symmetric and intact.  Babinski response was deferred.    ASSESSMENT AND PLAN:   53 y.o. year old female  here with:  No concern of migraines today , but vertigo  1)ANA positive dx of lupus, but limited access to rheumatological records. Started on plaquenil  , needs ophthalmological follow up.  Unaware of any nephritis, cerebritis, or if she has phospholipid antibodies. Note from dr Alvira Josephs: 12-22-21 urine protein creatinine ratio normal, ANA 1: 40NH, ENA negative, beta-2  GP 1 negative, anticardiolipin negative, C3-C4 normal, RF negative   2)  Orthostatic  lightheadedness with repeatedly measured low BP, dehydration likely.  BP improved when legs were elevated and dropped in seated and standing position.   3) Had 2 kidney stones removed surgically  this year, had reportedly suffered a blockage by nephrolithiasis and hydronephrosis .  This alone should make her drink more fluid.   4) Dizziness, retropulsion, counter- clockwise rotational  vertigo .This remains unexplained today. Vertebrobasilar migraine unlikely as migraines have become controlled , and vertigo remains unaffected.   5) presenting with some subjective dysequilibrium , affecting gait  with slight drift to the right and trend to retropulsion , associated with vertigo. Worse when she doesn't focus her eyes on an object, even worse with closed eyes ( Romberg) . Normal leg and feet sensation. She takes B 12 supplements sublingual.   5) lupus with scalp lesion, butterfly rash. Is this affecting her  vestibular system, could there be vasculitis cerebritis?   She weaned of prednisone  4 weeks ago, reportedly felt no better on steroids.   PLan : I have no single neurologic lesion to bundle these symptoms to, but I like to obtain an MRI Brain with and without contrast to rule out cerebritis. The patient reports a history of contrast allergy/ she had MRIs in the hospital for that reason, but I do not see any orders in the system for premedication, I will offer xanax as premedication and ask to hydrate , hydrate -Encourage more hydration.   This complex patient is outside the scope of my practice. I am unsure there is a neurological cause.  I would defer further treatment and evaluation to a tertiary care center ( prefers [facility])  if no MRI abnormalities are found.  I refilled her migraine preventing medication and zofran .  PRN follow up if MRI is abnormal.   I would like to thank Panosh, Joaquim Muir, MD for allowing me to meet with and to take care of this pleasant patient.   After spending a total time of  55  minutes face to face and additional time for physical and neurologic examination, review of laboratory studies,  personal review of imaging studies, reports and results of other testing  and review of referral information / records as far as provided in visit,   Electronically signed by: Neomia Banner, MD 09/20/2023 2:28 PM  [facility] Board certified by The [employer] of Sleep Medicine and Diplomate of the [employer] of [employer] Medicine. Board certified In Neurology through the ABPN, Fellow of the [employer] of [employer].

## 2023-09-20 NOTE — Addendum Note (Signed)
 Addended by: Neomia Banner on: 09/20/2023 03:19 PM   Modules accepted: Orders

## 2023-09-20 NOTE — Patient Instructions (Addendum)
 Vertigo Vertigo is the feeling that you or the things around you are moving or spinning when they're not. It's different than feeling dizzy. It can also cause: Loss of balance. Trouble standing or walking. Nausea and vomiting. This feeling can come and go at any time. It can last from a few seconds to minutes or even hours. It may go away on its own or be treated with medicine. What are the types of vertigo? There are two types of vertigo: Peripheral vertigo happens when parts of your inner ear don't work like they should. This is the more common type. Central vertigo happens when your brain and spinal cord don't work like they should. Your health care provider will do tests to find out what kind of vertigo you have. This will help them decide on the right treatment for you. Follow these instructions at home: Eating and drinking Drink enough fluid to keep your pee (urine) pale yellow. Do not drink alcohol. Activity When you get up in the morning, first sit up on the side of the bed. When you feel okay, stand slowly while holding onto something. Move slowly. Avoid sudden body or head movements. Avoid certain positions, as told by your provider. Use a cane if you have trouble standing or walking. Sit down right away if you feel unsteady. Place items in your home so they're easy for you to reach without bending or leaning over. Return to normal activities when you're told. Ask what things are safe for you to do. General instructions Take your medicines only as told by your provider. Contact a health care provider if: Your medicines don't help or make your vertigo worse. You get new symptoms. You have a fever. You have nausea or vomiting. Your family or friends spot any changes in how you're acting. A part of your body goes numb. You feel tingling and prickling in a part of your body. You get very bad headaches. Get help right away if: You're always dizzy or you faint. You have a  stiff neck. You have trouble moving or speaking. Your hands, arms, or legs feel weak. Your hearing or eyesight changes. These symptoms may be an emergency. Call 911 right away. Do not wait to see if the symptoms will go away. Do not drive yourself to the hospital. This information is not intended to replace advice given to you by your health care provider. Make sure you discuss any questions you have with your health care provider. Document Revised: 02/03/2023 Document Reviewed: 08/06/2022 Elsevier Patient Education  2024 Elsevier Inc[employer]Systemic Lupus Erythematosus, Adult Systemic lupus erythematosus (SLE) is a long-term (chronic) disease that can affect many parts of the body. SLE is an autoimmune disease. With this type of disease, the body's defense system (immune system) mistakenly attacks healthy tissues. This can cause damage to the skin, joints, blood vessels, brain, kidneys, lungs, heart, and other internal organs. It causes pain, irritation, and inflammation. What are the causes? The cause of this condition is not known. What increases the risk? The following factors may make you more likely to develop this condition: Being female. Being of Asian, Hispanic, or African American descent. Having a family history of the condition. Being exposed to tobacco smoke or smoking cigarettes. Having an infection with a virus, such as Epstein-Barr virus. Having a history of exposure to silica dust, metals, chemicals, mold or mildew, or insecticides. Using oral contraceptives or hormone replacement therapy. What are the signs or symptoms? This condition can affect almost any organ  or system in the body. Symptoms of the condition depend on which organ or system is affected. The most common symptoms include: Fever. Tiredness (fatigue). Weight loss. Muscle aches. Joint pain. Skin rashes, especially over the nose and cheeks (butterfly rash) and after sun exposure. Symptoms can come and go. A  period of time when symptoms get worse or come back is called a flare. A period of time with no symptoms is called a remission. How is this diagnosed? This condition is diagnosed based on: Your symptoms. Your medical history. A physical exam. You may also have tests, including: Blood tests. Urine tests. A chest X-ray. You may be referred to an autoimmune disease specialist (rheumatologist). How is this treated? There is no cure for this condition, but treatment can help to control symptoms, prevent flares (keep symptoms in remission), and prevent damage to the heart, lungs, kidneys, and other organs. Treatment will depend on what symptoms you are having and what organs or systems are affected. Treatment may involve taking a combination of medicines over time. Common medicines used to treat this condition include: Antimalarial medicines to control symptoms, prevent flares, and protect against organ damage. Corticosteroids and NSAIDs to reduce inflammation. Medicines to weaken your immune system (immunosuppressants). Biologic response modifiers to reduce inflammation and damage. Follow these instructions at home: Medicines Take over-the-counter and prescription medicines only as told by your health care provider. Do not take any medicines that contain estrogen without first checking with your health care provider. Estrogen can trigger flares and may increase your risk for blood clots. Eating and drinking Eat a heart-healthy diet. This may include: Eating high-fiber foods, such as beans, whole grains, and fresh fruits and vegetables. Eating heart-healthy fats (omega-3 fats), such as fish, flaxseed, and flaxseed oil. Limiting foods that are high in saturated fat and cholesterol, such as processed and fried foods, fatty meat, and full-fat dairy. Limiting how much salt (sodium) you eat. Include calcium and vitamin D  in your diet. Good sources of calcium and vitamin D  include: Low-fat dairy  products such as milk, yogurt, and cheese. Certain fish, such as fresh or canned salmon, tuna, and sardines. Products that have calcium and vitamin D  added to them (are fortified), such as fortified cereals or juice. Lifestyle     Stay active, as directed by your health care provider. Do not use any products that contain nicotine or tobacco. These products include cigarettes, chewing tobacco, and vaping devices, such as e-cigarettes. If you need help quitting, ask your health care provider. Protect your skin from the sun by applying sunblock and wearing protective hats and clothing. Learn as much as you can about your condition and have a good support system in place. Support may come from family, friends, or a lupus support group. General instructions Work closely with all of your health care providers to manage your condition. Stay up to date on all vaccines as told by your health care provider. Keep all follow-up visits. This is important. Contact a health care provider if: You have a fever. Your symptoms flare. You develop new symptoms. You have an upper respiratory infection. You have bloody, foamy, or coffee-colored urine. There are changes in your urination. For example, you urinate more often at night. You think that you may be depressed or have anxiety. You become pregnant or plan to become pregnant. Pregnancy in women with this condition is considered high risk. Get help right away if: You have chest pain. You have trouble breathing. You have a seizure. You  suddenly get a very bad headache. You suddenly develop facial or body weakness. You cannot speak. You cannot understand speech. These symptoms may be an emergency. Get help right away. Call 911. Do not wait to see if the symptoms will go away. Do not drive yourself to the hospital. Summary Systemic lupus erythematosus (SLE) is a long-term disease that can affect many parts of the body. SLE is an autoimmune disease.  That means your body's defense system (immune system) mistakenly attacks healthy tissues. There is no cure for this condition, but treatment can help to control symptoms, prevent flares, and prevent damage to your organs. Treatment may involve taking a combination of medicines over time. This information is not intended to replace advice given to you by your health care provider. Make sure you discuss any questions you have with your health care provider.   Hydroxychloroquine  Tablets What is this medication? HYDROXYCHLOROQUINE  (hye drox ee KLOR oh kwin) treats autoimmune conditions, such as rheumatoid arthritis and lupus. It works by slowing down an overactive immune system. It may also be used to prevent and treat malaria. It works by killing the parasite that causes malaria. It belongs to a group of medications called DMARDs. This medicine may be used for other purposes; ask your health care provider or pharmacist if you have questions. COMMON BRAND NAME(S): Plaquenil , Quineprox, SOVUNA  What should I tell my care team before I take this medication? They need to know if you have any of these conditions: Diabetes Eye disease, vision problems Frequently drink alcohol G6PD deficiency Heart disease Irregular heartbeat or rhythm Kidney disease Liver disease Porphyria Psoriasis An unusual or allergic reaction to hydroxychloroquine , other medications, foods, dyes, or preservatives Pregnant or trying to get pregnant Breastfeeding How should I use this medication? Take this medication by mouth with water . Take it as directed on the prescription label. Do not cut, crush, or chew this medication. Swallow the tablets whole. Take it with food. Do not take it more than directed. Take all of this medication unless your care team tells you to stop it early. Keep taking it even if you think you are better. Take products with antacids in them at a different time of day than this medication. Take this  medication 4 hours before or 4 hours after antacids. Talk to your care team if you have questions. Talk to your care team about the use of this medication in children. While this medication may be prescribed for selected conditions, precautions do apply. Overdosage: If you think you have taken too much of this medicine contact a poison control center or emergency room at once. NOTE: This medicine is only for you. Do not share this medicine with others. What if I miss a dose? If you miss a dose, take it as soon as you can. If it is almost time for your next dose, take only that dose. Do not take double or extra doses. What may interact with this medication? Do not take this medication with any of the following: Cisapride Dronedarone Pimozide Thioridazine This medication may also interact with the following: Ampicillin Antacids Cimetidine Cyclosporine Digoxin Kaolin Medications for diabetes, such as insulin, glipizide, glyburide Medications for seizures, such as carbamazepine, phenobarbital, phenytoin Mefloquine Methotrexate Other medications that cause heart rhythm changes Praziquantel This list may not describe all possible interactions. Give your health care provider a list of all the medicines, herbs, non-prescription drugs, or dietary supplements you use. Also tell them if you smoke, drink alcohol, or use illegal drugs.  Some items may interact with your medicine. What should I watch for while using this medication? Visit your care team for regular checks on your progress. Tell your care team if your symptoms do not start to get better or if they get worse. You may need blood work done while you are taking this medication. If you take other medications that can affect heart rhythm, you may need more testing. Talk to your care team if you have questions. Your vision may be tested before and during use of this medication. Tell your care team right away if you have any change in your  eyesight. This medication may cause serious skin reactions. They can happen weeks to months after starting the medication. Contact your care team right away if you notice fevers or flu-like symptoms with a rash. The rash may be red or purple and then turn into blisters or peeling of the skin. Or, you might notice a red rash with swelling of the face, lips or lymph nodes in your neck or under your arms. If you or your family notice any changes in your behavior, such as new or worsening depression, thoughts of harming yourself, anxiety, or other unusual or disturbing thoughts, or memory loss, call your care team right away. What side effects may I notice from receiving this medication? Side effects that you should report to your care team as soon as possible: Allergic reactions--skin rash, itching, hives, swelling of the face, lips, tongue, or throat Aplastic anemia--unusual weakness or fatigue, dizziness, headache, trouble breathing, increased bleeding or bruising Change in vision Heart rhythm changes--fast or irregular heartbeat, dizziness, feeling faint or lightheaded, chest pain, trouble breathing Infection--fever, chills, cough, or sore throat Low blood sugar (hypoglycemia)--tremors or shaking, anxiety, sweating, cold or clammy skin, confusion, dizziness, rapid heartbeat Muscle injury--unusual weakness or fatigue, muscle pain, dark yellow or brown urine, decrease in amount of urine Pain, tingling, or numbness in the hands or feet Rash, fever, and swollen lymph nodes Redness, blistering, peeling, or loosening of the skin, including inside the mouth Thoughts of suicide or self-harm, worsening mood, or feelings of depression Unusual bruising or bleeding Side effects that usually do not require medical attention (report to your care team if they continue or are bothersome): Diarrhea Headache Nausea Stomach pain Vomiting This list may not describe all possible side effects. Call your doctor for  medical advice about side effects. You may report side effects to FDA at 1-800-FDA-1088. Where should I keep my medication? Keep out of the reach of children and pets. Store at room temperature up to 30 degrees C (86 degrees F). Protect from light. Get rid of any unused medication after the expiration date. To get rid of medications that are no longer needed or have expired: Take the medication to a medication take-back program. Check with your pharmacy or law enforcement to find a location. If you cannot return the medication, check the label or package insert to see if the medication should be thrown out in the garbage or flushed down the toilet. If you are not sure, ask your care team. If it is safe to put it in the trash, empty the medication out of the container. Mix the medication with cat litter, dirt, coffee grounds, or other unwanted substance. Seal the mixture in a bag or container. Put it in the trash. NOTE: This sheet is a summary. It may not cover all possible information. If you have questions about this medicine, talk to your doctor, pharmacist, or health  care provider.  2024 Elsevier/Gold Standard (2021-11-09 00:00:00) Document Revised: 02/06/2021 Document Reviewed: 02/06/2021 Elsevier Patient Education  2024 [employer].

## 2023-09-21 ENCOUNTER — Telehealth: Payer: Self-pay | Admitting: Neurology

## 2023-09-21 ENCOUNTER — Ambulatory Visit: Payer: Self-pay

## 2023-09-21 NOTE — Telephone Encounter (Signed)
 Spouse is calling re: pt's low blood pressure during appointment on yesterday, and that still being of an issue today.  Spouse states pt's PCP has no walk in slots available.  He would like ot know if there is anything Dr Albertina Hugger could suggest while pt waiting to hear or be seen by PCP

## 2023-09-21 NOTE — Telephone Encounter (Signed)
 Copied from CRM [id number]. Topic: Clinical - Red Word Triage >> Sep 21, 2023  3:19 PM Jordan Mayer wrote: Red Word that prompted transfer to Nurse Triage: Significant low blood pressure issues and trouble standing.    Chief Complaint: Blood Pressure-Low  Symptoms: Unsteadiness, Pallor, Dyspnea with exertion  Frequency: Acute  Pertinent Negatives: Patient denies chest pain Disposition: [x] ED /[] Urgent Care (no appt availability in office) / [] Appointment(In office/virtual)/ []  [facility]/ [] Home Care/ [] Refused Recommended Disposition /[] [facility]/ []  Follow-up with PCP Additional Notes: JG is being triaged for a low blood pressure that is accompanied by pallor, dizziness, and confusion. The patient states she can barely walk without feeling like she is going to fall. Recommended the ED as the appropriate disposition due to symptoms, As her mother will be driving her to the ED.   Reason for Disposition  [1] Systolic BP < 80 AND [2] NOT dizzy, lightheaded or weak  Answer Assessment - Initial Assessment Questions 1. BLOOD PRESSURE: "What is the blood pressure?" "Did you take at least two measurements 5 minutes apart?"     80/62, Yesterday  2. ONSET: "When did you take your blood pressure?"     ;Yesterday   3. HOW: "How did you obtain the blood pressure?" (e.g., visiting nurse, automatic home BP monitor)     Yesterday, at the neurologist's   4. HISTORY: "Do you have a history of low blood pressure?" "What is your blood pressure normally?"     No  5. MEDICINES: "Are you taking any medications for blood pressure?" If Yes, ask: "Have they been changed recently?"     No  6. PULSE RATE: "Do you know what your pulse rate is?"      No  7. OTHER SYMPTOMS: "Have you been sick recently?" "Have you had a recent injury?"     Unsteadiness  8. PREGNANCY: "Is there any chance you are pregnant?" "When was your last menstrual period?"     No and No  Protocols used: Blood  Pressure - Low-A-AH

## 2023-09-22 LAB — LAB REPORT - SCANNED: EGFR (Non-African Amer.): 53

## 2023-09-26 ENCOUNTER — Telehealth: Payer: Self-pay | Admitting: Neurology

## 2023-09-26 NOTE — Telephone Encounter (Signed)
 Spouse has called re: letter request for [employer]  09-27-23, spouse will submit request to MD via my chart

## 2023-09-27 ENCOUNTER — Other Ambulatory Visit: Payer: Self-pay | Admitting: Obstetrics & Gynecology

## 2023-09-27 DIAGNOSIS — N6322 Unspecified lump in the left breast, upper inner quadrant: Secondary | ICD-10-CM

## 2023-09-30 LAB — HM PAP SMEAR: HPV, high-risk: NEGATIVE

## 2023-10-05 ENCOUNTER — Encounter: Payer: Self-pay | Admitting: Neurology

## 2023-10-05 ENCOUNTER — Ambulatory Visit
Admission: RE | Admit: 2023-10-05 | Discharge: 2023-10-05 | Disposition: A | Discharge status: Home / Self Care | Source: Ambulatory Visit | Attending: Neurology | Admitting: Neurology

## 2023-10-05 ENCOUNTER — Telehealth: Payer: Self-pay

## 2023-10-05 DIAGNOSIS — R42 Dizziness and giddiness: Secondary | ICD-10-CM

## 2023-10-05 DIAGNOSIS — R278 Other lack of coordination: Secondary | ICD-10-CM

## 2023-10-05 DIAGNOSIS — M3219 Other organ or system involvement in systemic lupus erythematosus: Secondary | ICD-10-CM

## 2023-10-05 DIAGNOSIS — I951 Orthostatic hypotension: Secondary | ICD-10-CM

## 2023-10-05 MED ORDER — DIPHENHYDRAMINE HCL 50 MG PO TABS: ORAL_TABLET | ORAL | 0 refills | Status: DC

## 2023-10-05 MED ORDER — PREDNISONE 50 MG PO TABS: ORAL_TABLET | ORAL | 0 refills | Status: DC

## 2023-10-06 ENCOUNTER — Ambulatory Visit: Payer: Self-pay | Admitting: Neurology

## 2023-10-06 ENCOUNTER — Ambulatory Visit
Admission: RE | Admit: 2023-10-06 | Discharge: 2023-10-06 | Disposition: A | Discharge status: Home / Self Care | Source: Ambulatory Visit | Attending: Neurology | Admitting: Neurology

## 2023-10-06 DIAGNOSIS — R42 Dizziness and giddiness: Secondary | ICD-10-CM | POA: Diagnosis not present

## 2023-10-06 MED ORDER — GADOPICLENOL 0.5 MMOL/ML IV SOLN
7.5000 mL | Freq: Once | INTRAVENOUS | Status: AC | PRN
  Administered 2023-10-06: 7.5 mL via INTRAVENOUS

## 2023-10-12 ENCOUNTER — Other Ambulatory Visit: Payer: Self-pay | Admitting: Neurology

## 2023-10-12 ENCOUNTER — Encounter: Payer: Self-pay | Admitting: Neurology

## 2023-10-12 DIAGNOSIS — R519 Headache, unspecified: Secondary | ICD-10-CM

## 2023-10-12 DIAGNOSIS — H532 Diplopia: Secondary | ICD-10-CM

## 2023-10-12 DIAGNOSIS — R768 Other specified abnormal immunological findings in serum: Secondary | ICD-10-CM

## 2023-10-12 DIAGNOSIS — G450 Vertebro-basilar artery syndrome: Secondary | ICD-10-CM

## 2023-10-12 DIAGNOSIS — R7689 Other specified abnormal immunological findings in serum: Secondary | ICD-10-CM

## 2023-10-12 DIAGNOSIS — R278 Other lack of coordination: Secondary | ICD-10-CM

## 2023-10-12 DIAGNOSIS — H538 Other visual disturbances: Secondary | ICD-10-CM

## 2023-10-12 DIAGNOSIS — G43009 Migraine without aura, not intractable, without status migrainosus: Secondary | ICD-10-CM

## 2023-10-12 DIAGNOSIS — H814 Vertigo of central origin: Secondary | ICD-10-CM

## 2023-10-12 MED ORDER — PREDNISONE 10 MG PO TABS: ORAL_TABLET | ORAL | 1 refills | Status: DC

## 2023-10-12 NOTE — Progress Notes (Signed)
 Prednisone  dose pack for 28 days ordered to relief from headaches.

## 2023-10-14 ENCOUNTER — Ambulatory Visit
Admission: RE | Admit: 2023-10-14 | Discharge: 2023-10-14 | Disposition: A | Discharge status: Home / Self Care | Source: Ambulatory Visit | Attending: Obstetrics & Gynecology | Admitting: Obstetrics & Gynecology

## 2023-10-14 DIAGNOSIS — N6322 Unspecified lump in the left breast, upper inner quadrant: Secondary | ICD-10-CM

## 2023-10-18 ENCOUNTER — Ambulatory Visit: Payer: Self-pay

## 2023-10-18 NOTE — Telephone Encounter (Signed)
 Chief Complaint:  Abd Pain- HX of Lupus. This happens when she is on any steriod.    Symptoms:  -Pain: 7/10 -Intermittent -Diarrhea   Onset  4-5 days ago. When Steriod was initiated.    Patient denies  Fever, sever pain, vomiting, new onset abd distention.   Disposition: [ ] ED /[ ] Urgent Care (no appt availability in office) / [ X]Appointment(In office/virtual)/ [ ]  [facility]/ [ ] Home Care/ [ ] Refused Recommended Disposition /[ ] [facility]/ [ ]  Follow-up with PCP   Additional Notes:  Appointment scheduled for patient appropriately.  Patient educated on pertinent s/s that would warrant emergent help/call 911/ ED/UC.  Patient verbalized understanding and agrees with plan . No additional questions/concerns noted during the time of the call.     Complete triage note below:      Copied from CRM [id number]. Topic: Clinical - Red Word Triage >> Oct 18, 2023  4:31 PM Ameerah G wrote: Abdominal pain Reason for Disposition  [1] MODERATE pain (e.g., interferes with normal activities) AND [2] pain comes and goes (cramps) AND [3] present > 24 hours  (Exception: Pain with Vomiting or Diarrhea - see that Guideline.)  Answer Assessment - Initial Assessment Questions 1. LOCATION: "Where does it hurt?"      ----------Upper abd.    2. RADIATION: "Does the pain shoot anywhere else?" (e.g., chest, back)     ---------- Radiate all over    3. ONSET: "When did the pain begin?" (e.g., minutes, hours or days ago)      ------ 5 days ago ( Started steriods around the same time)   4. SUDDEN: "Gradual or sudden onset?"     -------   5. PATTERN "Does the pain come and go, or is it constant?"    - If it comes and goes: "How long does it last?" "Do you have pain now?"     (Note: Comes and goes means the pain is intermittent. It goes away completely between bouts.)    - If constant: "Is it getting better, staying the same, or getting worse?"      (Note: Constant means  the pain never goes away completely; most serious pain is constant and gets worse.)     --------------- Intermittent. When it comes it stays  for quite some time    6. SEVERITY: "How bad is the pain?"  (e.g., Scale 1-10; mild, moderate, or severe)    - MILD (1-3): Doesn't interfere with normal activities, abdomen soft and not tender to touch.     - MODERATE (4-7): Interferes with normal activities or awakens from sleep, abdomen tender to touch.     - SEVERE (8-10): Excruciating pain, doubled over, unable to do any normal activities.        ---------------7/10     7. RECURRENT SYMPTOM: "Have you ever had this type of stomach pain before?" If Yes, ask: "When was the last time?" and "What happened that time?"      --------   8. CAUSE: "What do you think is causing the stomach pain?"     --- Lupus.. this has happened before after prednisone  steroid.   9. RELIEVING/AGGRAVATING FACTORS: "What makes it better or worse?" (e.g., antacids, bending or twisting motion, bowel movement)     -----  10. OTHER SYMPTOMS: "Do you have any other symptoms?" (e.g., back pain, diarrhea, fever, urination pain, vomiting)       -------- Diarrhea  -------------- Chills, no fever  Protocols used: Abdominal Pain -  Female-A-AH

## 2023-10-19 ENCOUNTER — Ambulatory Visit: Admitting: Family Medicine

## 2023-10-26 MED ORDER — ALPRAZOLAM 0.25 MG PO TABS: 0.2500 mg | ORAL_TABLET | Freq: Every day | ORAL | 0 refills | Status: DC | PRN

## 2023-10-26 NOTE — Addendum Note (Signed)
 Addended by: Neomia Banner on: 10/26/2023 09:18 PM   Modules accepted: Orders

## 2023-11-11 ENCOUNTER — Other Ambulatory Visit: Payer: Self-pay | Admitting: *Deleted

## 2023-11-11 MED ORDER — MELOXICAM 15 MG PO TABS: 7.5000 mg | ORAL_TABLET | Freq: Every day | ORAL | 0 refills | Status: DC

## 2023-11-15 ENCOUNTER — Telehealth: Payer: Self-pay | Admitting: Neurology

## 2023-11-15 NOTE — Telephone Encounter (Signed)
 I took a call from Luke Shields at [facility] [phone]) and she said that for the additional CRP lab that we asked if they can do on the order they were not able to find out from her insurance if this lab is covered or how much it would cost the patient. She said that they CAN do the lab but don't know how much the patient would be responsible for. She wanted to know whether we want them to go through with the lab or not. She said that one of their techs had been messaging Dr. Chalice in Epic about it but whatever was said is not documented in her chart and Dr. Chalice is on vacation. Augustin looked over the whole chart and said she was the one who put the order in and we wanted to know if CRP could be added, if so great, if not then still do the rest of the labs on the order. We understand that they CAN do the lab but if they don't know the additional cost then that would be up to the patient whether or not she is agreeable to that one lab without knowing the cost or she can ask her insurance. Kim from GI was continuously rude to me about it saying that she didn't want the patient to come in for the LP and not get the CRP then Dr. Chalice question why it wasn't done if it is detrimental to her care and say she needs it and then the patient would have to go in for a second LP. [facility] is responsible for prior authorizations and billing for all lumbar punctures. Kim wanted to cancel the whole LP appointment for tomorrow and we Merril and I) said no keep the appointment and do all the regular labs that are listed on the LP order. She was very rude and acting like I didn't understand what she was saying. I said please do the LP with the labs on the order and take off the CRP if the patient doesn't want to agree to being responsible for an unknown additional cost and she agreed to keep the appointment for tomorrow. She said that she's tried calling here 3 times today and said we're providing a  service for y'all as if talking to the patient's insurance was them going out of their way for us  and our office wasn't being helpful. She took down my name so that she doesn't get in trouble for anything. This was the first time I have had anything to do with this and Augustin was very helpful in figuring it out for me.

## 2023-11-15 NOTE — Telephone Encounter (Signed)
 Request to discuss LP for pt

## 2023-11-16 ENCOUNTER — Ambulatory Visit
Admission: RE | Admit: 2023-11-16 | Discharge: 2023-11-16 | Disposition: A | Discharge status: Home / Self Care | Source: Ambulatory Visit | Attending: Neurology | Admitting: Neurology

## 2023-11-16 DIAGNOSIS — R768 Other specified abnormal immunological findings in serum: Secondary | ICD-10-CM

## 2023-11-16 DIAGNOSIS — H538 Other visual disturbances: Secondary | ICD-10-CM

## 2023-11-16 DIAGNOSIS — G43009 Migraine without aura, not intractable, without status migrainosus: Secondary | ICD-10-CM

## 2023-11-16 DIAGNOSIS — R278 Other lack of coordination: Secondary | ICD-10-CM

## 2023-11-16 DIAGNOSIS — H532 Diplopia: Secondary | ICD-10-CM

## 2023-11-16 DIAGNOSIS — G450 Vertebro-basilar artery syndrome: Secondary | ICD-10-CM

## 2023-11-16 DIAGNOSIS — R519 Headache, unspecified: Secondary | ICD-10-CM

## 2023-11-16 DIAGNOSIS — H814 Vertigo of central origin: Secondary | ICD-10-CM

## 2023-11-16 NOTE — Discharge Instructions (Addendum)
 Lumbar Puncture Discharge Instructions  You may resume normal activities; however, do not exert yourself strongly or do any heavy lifting today and tomorrow.   DO NOT drive today.    You may resume your normal diet and medications unless otherwise indicated. Drink a lot of extra fluids today and tomorrow.   The incidence of a spinal headache (headache, nausea and/or vomiting is about 5% (one in 20 patients).  If you develop a headache when you are sitting up or standing that gets better when you lie down, please lie flat for 24 hours and drink plenty of fluids until the headache goes away.  Caffeinated beverages may be helpful.   If you develop severe nausea and vomiting or a headache that does not go away with the flat bedrest after 48 hours, please call [phone].   Call your physician for a follow-up appointment.  The results of your myelogram will be sent directly to your physician by the following day.  Please call us  at [phone] if you have any questions or if complications develop after you arrive home.     Thank you for visiting [facility] today!

## 2023-11-16 NOTE — Progress Notes (Signed)
 One red-topped tube of blood drawn for LP labs from left [facility] space; site unremarkable.

## 2023-11-23 ENCOUNTER — Ambulatory Visit: Payer: Self-pay | Admitting: Neurology

## 2023-11-23 DIAGNOSIS — R42 Dizziness and giddiness: Secondary | ICD-10-CM

## 2023-11-23 DIAGNOSIS — H9203 Otalgia, bilateral: Secondary | ICD-10-CM

## 2023-11-23 DIAGNOSIS — R519 Headache, unspecified: Secondary | ICD-10-CM

## 2023-11-23 DIAGNOSIS — R768 Other specified abnormal immunological findings in serum: Secondary | ICD-10-CM

## 2023-11-24 ENCOUNTER — Telehealth: Payer: Self-pay | Admitting: Neurology

## 2023-11-24 DIAGNOSIS — R768 Other specified abnormal immunological findings in serum: Secondary | ICD-10-CM

## 2023-11-24 DIAGNOSIS — G44221 Chronic tension-type headache, intractable: Secondary | ICD-10-CM

## 2023-11-24 DIAGNOSIS — R42 Dizziness and giddiness: Secondary | ICD-10-CM

## 2023-11-24 MED ORDER — ALPRAZOLAM 0.25 MG PO TABS: 0.5000 mg | ORAL_TABLET | Freq: Every day | ORAL | 0 refills | Status: DC | PRN

## 2023-11-24 NOTE — Telephone Encounter (Signed)
 The patient's spinal tap yielded unremarkable results, her MRI had been normal last year, her vertigo sensation has been on and off but has only minimally responded to the use of a steroid Dosepak or of low-dose Xanax .    Plan :  I am happy to refill xanax  for the next 14 days , trying to break the vertigo cycle.    I would like to send her again for a vestibular evaluation as she describes a counterclockwise vertigo.  I placed both orders.   Jordan Gores, MD   I also want to offer a referral to headache clinic with Dr Oneita or at a tertiary center, such as [facility] headache clinic.

## 2023-11-24 NOTE — Telephone Encounter (Signed)
 Referral  for Neuro Rehab sent thru epic to [facility]

## 2023-11-24 NOTE — Telephone Encounter (Signed)
 I have replied to patient mychart message

## 2023-11-25 ENCOUNTER — Other Ambulatory Visit: Payer: Self-pay | Admitting: Neurology

## 2023-11-25 MED ORDER — MELOXICAM 15 MG PO TABS: 7.5000 mg | ORAL_TABLET | Freq: Every day | ORAL | 1 refills | Status: DC

## 2023-11-25 NOTE — Addendum Note (Signed)
 Addended by: CHALICE SAUNAS on: 11/25/2023 09:40 AM   Modules accepted: Orders

## 2023-11-25 NOTE — Progress Notes (Signed)
 Refilled meloxicam  . CD

## 2023-11-26 LAB — ENCEPHALITIS AB EVAL W/ REFLEX TO TITER AND LINE BLOT, CSF
AGNA (SOX1) AB, IFA, CSF: NEGATIVE
AMPAR1 AB, CBA, CSF: NEGATIVE
AMPAR2 AB, CBA, CSF: NEGATIVE
AMPHIPHYSIN AB, IFA, CSF: NEGATIVE
ANNA1 (HU) AB, IFA, CSF: NEGATIVE
ANNA2 (RI) AB, IFA, CSF: NEGATIVE
ANNA3 AB, IFA, CSF: NEGATIVE
AQUAPORIN 4 (AQP4) AB, CBA, CSF: NEGATIVE
AQUAPORIN 4 AB, IFA, CSF: NEGATIVE
CASPR2 AB, CBA, CSF: NEGATIVE
CRMP5 (CV2) AB, IFA, CSF: NEGATIVE
DPPX AB, CBA, CSF: NEGATIVE
GABABR AB, CBA, CSF: NEGATIVE
GAD65 AB, IFA, CSF: NEGATIVE
LGI1 AB, CBA, CSF: NEGATIVE
MA2/TA AB, IFA, CSF: NEGATIVE
MYELIN AB, IFA, CSF: NEGATIVE
NMDAR1 AB, CBA, CSF: NEGATIVE
PCA TR (DNER) AB, IFA,CSF: NEGATIVE
PCA1 (YO) AB, IFA, CSF: NEGATIVE
PCA2 AB, IFA, CSF: NEGATIVE
Voltage Gated Potassium Channel (VGKC)AB,CSF: <20 pmol/L (ref <20)

## 2023-11-26 LAB — CSF CULTURE W GRAM STAIN
MICRO NUMBER:: 16651920
Result:: NO GROWTH
SPECIMEN QUALITY:: ADEQUATE

## 2023-11-26 LAB — CSF CELL COUNT WITH DIFFERENTIAL
RBC Count, CSF: 54 {cells}/uL — ABNORMAL HIGH
TOTAL NUCLEATED CELL: 1 {cells}/uL (ref 0–5)

## 2023-11-26 LAB — PROTEIN, CSF: Total Protein, CSF: 31 mg/dL (ref 15–45)

## 2023-11-26 LAB — GLUCOSE, CSF: Glucose, CSF: 56 mg/dL (ref 40–80)

## 2023-11-26 LAB — OLIGOCLONAL BANDS, CSF + SERM: Oligo Bands: ABSENT

## 2023-11-28 ENCOUNTER — Telehealth: Payer: Self-pay | Admitting: Neurology

## 2023-11-28 NOTE — Telephone Encounter (Signed)
 Referral  for neurology fax to [facility] to see Dr. Santiago Glad. Phone: [phone], Fax: [phone]

## 2023-11-29 NOTE — Telephone Encounter (Signed)
 [facility] called with questions regarding insurance listed on referral. When was the last office visit of patient. Informed her patient last office visit was 09/20/23. Referral [employer] insurance has termed; will call patient with updated insurance information.

## 2023-11-30 NOTE — Therapy (Incomplete)
 OUTPATIENT PHYSICAL THERAPY VESTIBULAR EVALUATION     Patient Name: Jordan Mayer MRN: [id number] DOB:09/20/1970, 53 y.o., female Today's Date: 11/30/2023  END OF SESSION:   Past Medical History:  Diagnosis Date   ADD (attention deficit disorder)    Allergy    Anxiety    Asthma    Chicken pox    as child   Chronic migraines    Concussion    x 2 no residual   Depression    Family history of melanoma    Gallstones    GERD (gastroesophageal reflux disease)    Kidney stones    Lupus    PCOS (polycystic ovarian syndrome)    Pneumonia 2003   Renal disorder    kidney disease   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    Shingles    Past Surgical History:  Procedure Laterality Date   BREAST BIOPSY Left    2019   BUNIONECTOMY Right    CHOLECYSTECTOMY     COLONOSCOPY     CYSTOSCOPY W/ URETERAL STENT PLACEMENT Left 12/29/2022   Procedure: CYSTOSCOPY WITH LEFT RETROGRADE PYELOGRAM/URETERAL STENT PLACEMENT;  Surgeon: Gaston Hamilton, MD;  Location: [facility];  Service: Urology;  Laterality: Left;   CYSTOSCOPY/URETEROSCOPY/HOLMIUM LASER/STENT PLACEMENT Left 01/05/2023   Procedure: CYSTOSCOPY, LEFT URETEROSCOPY, HOLMIUM LASER LITHOTRIPSY, AND LEFT URETERAL STENT PLACEMENT;  Surgeon: Devere Lonni Righter, MD;  Location: [facility];  Service: Urology;  Laterality: Left;  45 MINUTES   DILATATION & CURETTAGE/HYSTEROSCOPY WITH MYOSURE N/A 06/15/2023   Procedure: DILATATION & CURETTAGE/HYSTEROSCOPY WITH MYOSURE;  Surgeon: Diedre Rosaline BRAVO, MD;  Location: [facility];  Service: Gynecology;  Laterality: N/A;  instrument needed = myosure lite   HIATAL HERNIA REPAIR     with gastric sleeve   LAPAROSCOPIC CHOLECYSTECTOMY SINGLE SITE WITH INTRAOPERATIVE CHOLANGIOGRAM N/A 06/14/2017   Procedure: LAPAROSCOPIC CHOLECYSTECTOMY SINGLE SITE;  Surgeon: Sheldon Standing, MD;  Location: [facility];  Service: General;  Laterality: N/A;   LAPAROSCOPIC GASTRIC SLEEVE RESECTION  09/2013    LASER ABLATION     Vascular   NASAL SINUS SURGERY  12/2021   TONSILLECTOMY  1988   WISDOM TOOTH EXTRACTION     Patient Active Problem List   Diagnosis Date Noted   Nephrolithiasis 12/29/2022   Ureteral stone with hydronephrosis 12/29/2022   Positive ANA (antinuclear antibody) 04/12/2022   Genetic testing 07/21/2021   Family history of melanoma 07/13/2021   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   Vertigo 12/23/2011   Otalgia of both ears 09/17/2010   SLEEP DISORDER/DISTURBANCE 09/27/2008   FATIGUE 07/15/2008   ADHD 12/05/2007   HEADACHE 11/26/2007   ALLERGIC RHINITIS 10/13/2007   NEPHROLITHIASIS, HX OF 10/13/2007   NONSPECIFIC MESENTERIC LYMPHADENITIS 03/17/2007   RENAL CALCULUS, LEFT 03/17/2007   DEPRESSION 03/07/2007   DYSPNEA ON EXERTION 03/07/2007    PCP: Charlett Apolinar POUR, MD  REFERRING PROVIDER: Dohmeier, Dedra, MD  REFERRING DIAG: G44.221 (ICD-10-CM) - Chronic tension-type headache, intractable R42 (ICD-10-CM) - Vertigo R76.8 (ICD-10-CM) - Positive ANA (antinuclear antibody)  THERAPY DIAG:  No diagnosis found.  ONSET DATE: ***  Rationale for Evaluation and Treatment: Rehabilitation  SUBJECTIVE:   SUBJECTIVE STATEMENT: *** Pt accompanied by: {accompnied:27141}  PERTINENT HISTORY: ADD, anxiety, asthma, migraines, concussion x2, Lupus, shingles   PAIN:  Are you having pain? {OPRCPAIN:27236}  PRECAUTIONS: {Therapy precautions:24002}  RED FLAGS: {PT Red Flags:29287}   WEIGHT BEARING RESTRICTIONS: {Yes ***/No:24003}  FALLS: Has patient fallen in last 6 months? {fallsyesno:27318}  LIVING ENVIRONMENT: Lives  with: {OPRC lives with:25569::lives with their family} Lives in: {Lives in:25570} Stairs: {opstairs:27293} Has following equipment at home: {Assistive devices:23999}  PLOF: {PLOF:24004}  PATIENT GOALS: ***  OBJECTIVE:  Note: Objective measures were completed at Evaluation unless otherwise  noted.  DIAGNOSTIC FINDINGS: 10/06/23 brain MRI: MRI scan of the brain with and without contrast showing only mild changes of chronic small vessel disease.  COGNITION: Overall cognitive status: {cognition:24006}   SENSATION: {sensation:27233}  POSTURE:  {posture:25561}  Cervical ROM:    Active AROM (deg) eval  Flexion   Extension   Right lateral flexion   Left lateral flexion   Right rotation   Left rotation   (Blank rows = not tested)  STRENGTH: ***  LOWER EXTREMITY MMT:   MMT Right eval Left eval  Hip flexion    Hip abduction    Hip adduction    Hip internal rotation    Hip external rotation    Knee flexion    Knee extension    Ankle dorsiflexion    Ankle plantarflexion    Ankle inversion    Ankle eversion    (Blank rows = not tested)   GAIT: Gait pattern: {gait characteristics:25376} Distance walked: *** Assistive device utilized: {Assistive devices:23999} Level of assistance: {Levels of assistance:24026} Comments: ***  FUNCTIONAL TESTS:  {Functional tests:24029}  PATIENT SURVEYS:  DHI: ***  VESTIBULAR ASSESSMENT   GENERAL OBSERVATION: ***   OCULOMOTOR EXAM: {Ocular Alignment:32714:s}   Ocular ROM: {RANGE OF MOTION:21649}   Spontaneous Nystagmus: {Spontaneous nystagmus:25263}   Gaze-Induced Nystagmus: {gaze-induced nystagmus:25264}   Smooth Pursuits: {smooth pursuit:25265}   Saccades: {saccades:25266}   Convergence/Divergence: *** cm    VESTIBULAR - OCULAR REFLEX:    Slow VOR: {slow VOR:25290}   VOR Cancellation: {vor cancellation:25291}   Head-Impulse Test: {head impulse test:25272}   Dynamic Visual Acuity: {dynamic visual acuity:25273}  AUDITORY SCREEN:  Rinne Test {DESC;Negative/Positive:115700014}    POSITIONAL TESTING:  Right Roll Test: *** Left Roll Test: ***  Right Loaded Dix-Hallpike: *** Left Loaded Dix-Hallpike: ***  Right Sidelying: *** Left Sidelying: ***     MOTION SENSITIVITY:    Motion Sensitivity  Quotient  Intensity: 0 = none, 1 = Lightheaded, 2 = Mild, 3 = Moderate, 4 = Severe, 5 = Vomiting  Intensity  1. Sitting to supine   2. Supine to L side   3. Supine to R side   4. Supine to sitting   5. L Hallpike-Dix   6. Up from L    7. R Hallpike-Dix   8. Up from R    9. Sitting, head  tipped to L knee   10. Head up from L  knee   11. Sitting, head  tipped to R knee   12. Head up from R  knee   13. Sitting head turns x5   14.Sitting head nods x5   15. In stance, 180  turn to L    16. In stance, 180  turn to R        OTHOSTATICS: {Exam; orthostatics:31331}  FUNCTIONAL GAIT: {Functional tests:24029}  TREATMENT DATE: ***   Canalith Repositioning:  {Canalith Repositioning:25283} Gaze Adaptation:  {gaze adaptation:25286} Habituation:  {habituation:25288} Other: ***  PATIENT EDUCATION: Education details: *** Person educated: {Person educated:25204} Education method: {Education Method:25205} Education comprehension: {Education Comprehension:25206}  HOME EXERCISE PROGRAM:  GOALS: Goals reviewed with patient? Yes  SHORT TERM GOALS: Target date: {follow up:25551}  Patient to be independent with initial HEP. Baseline: HEP initiated Goal status: {GOALSTATUS:25110}    LONG TERM GOALS: Target date: {follow up:25551}  Patient to be independent with advanced HEP. Baseline: Not yet initiated  Goal status: {GOALSTATUS:25110}  Patient to report 0/10 dizziness with standing vertical and horizontal VOR for 30 seconds. Baseline: Unable Goal status: {GOALSTATUS:25110}  Patient will report 0/10 dizziness with bed mobility.  Baseline: Symptomatic  Goal status: {GOALSTATUS:25110}  Patient to demonstrate *** sway with M-CTSIB condition with eyes closed/foam surface in order to improve safety in environments with uneven surfaces and dim  lighting. Baseline: *** Goal status: {GOALSTATUS:25110}  Patient to score at least 20/24 on DGI in order to decrease risk of falls. Baseline: *** Goal status: {GOALSTATUS:25110}  Patient will ambulate over outdoor surfaces with LRAD while performing head turns to scan environment with good stability in order to indicate safe community mobility. Baseline: Unable Goal status: {GOALSTATUS:25110}  Patient to score at least 18 points less on DHI in order to meet MCID and improve functional outcomes.  Baseline: *** Goal status: {GOALSTATUS:25110}    ASSESSMENT:  CLINICAL IMPRESSION:   Patient is a 53 y/o F presenting to OPPT with c/o dizziness for the past ***.   Denies head trauma, infection/illness, vision changes/double vision, hearing loss, tinnitus, otalgia, photo/phonophobia.  Oculomotor exam revealed ***.  Positional testing was ***.   Patient was educated on gentle *** HEP and reported understanding. Would benefit from skilled PT services ***x/week for *** weeks to address aforementioned impairments in order to optimize level of function.    OBJECTIVE IMPAIRMENTS: {opptimpairments:25111}.   ACTIVITY LIMITATIONS: {activitylimitations:27494}  PARTICIPATION LIMITATIONS: {participationrestrictions:25113}  PERSONAL FACTORS: {Personal factors:25162} are also affecting patient's functional outcome.   REHAB POTENTIAL: {rehabpotential:25112}  CLINICAL DECISION MAKING: {clinical decision making:25114}  EVALUATION COMPLEXITY: {Evaluation complexity:25115}   PLAN:  PT FREQUENCY: {rehab frequency:25116}  PT DURATION: {rehab duration:25117}  PLANNED INTERVENTIONS: {rehab planned interventions:25118::97110-Therapeutic exercises,97530- Therapeutic [phone]- Neuromuscular re-education,97535- Self Rjmz,02859- Manual therapy}  PLAN FOR NEXT SESSION: PIERRETTE Slater MARLA Campbell, PT 11/30/2023, 9:38 AM

## 2023-12-01 ENCOUNTER — Ambulatory Visit: Admitting: Physical Therapy

## 2023-12-06 ENCOUNTER — Ambulatory Visit: Attending: Neurology | Admitting: Physical Therapy

## 2023-12-06 DIAGNOSIS — R293 Abnormal posture: Secondary | ICD-10-CM | POA: Diagnosis present

## 2023-12-06 DIAGNOSIS — R42 Dizziness and giddiness: Secondary | ICD-10-CM | POA: Insufficient documentation

## 2023-12-06 DIAGNOSIS — R2681 Unsteadiness on feet: Secondary | ICD-10-CM | POA: Insufficient documentation

## 2023-12-06 NOTE — Therapy (Unsigned)
 OUTPATIENT PHYSICAL THERAPY VESTIBULAR EVALUATION     Patient Name: LAURIANA DENES MRN: [id number] DOB:Sep 15, 1970, 53 y.o., female Today's Date: 12/07/2023  END OF SESSION:  PT End of Session - 12/07/23 1310     Visit Number 1    Number of Visits 12    Date for PT Re-Evaluation 01/13/24    Authorization Type [employer]-No VL    PT Start Time 1621    PT Stop Time 1706    PT Time Calculation (min) 45 min    Activity Tolerance Other (comment)   limited by nausea, dizziness   Behavior During Therapy WFL for tasks assessed/performed          Past Medical History:  Diagnosis Date   ADD (attention deficit disorder)    Allergy    Anxiety    Asthma    Chicken pox    as child   Chronic migraines    Concussion    x 2 no residual   Depression    Family history of melanoma    Gallstones    GERD (gastroesophageal reflux disease)    Kidney stones    Lupus    PCOS (polycystic ovarian syndrome)    Pneumonia 2003   Renal disorder    kidney disease   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    Shingles    Past Surgical History:  Procedure Laterality Date   BREAST BIOPSY Left    2019   BUNIONECTOMY Right    CHOLECYSTECTOMY     COLONOSCOPY     CYSTOSCOPY W/ URETERAL STENT PLACEMENT Left 12/29/2022   Procedure: CYSTOSCOPY WITH LEFT RETROGRADE PYELOGRAM/URETERAL STENT PLACEMENT;  Surgeon: Gaston Hamilton, MD;  Location: [facility];  Service: Urology;  Laterality: Left;   CYSTOSCOPY/URETEROSCOPY/HOLMIUM LASER/STENT PLACEMENT Left 01/05/2023   Procedure: CYSTOSCOPY, LEFT URETEROSCOPY, HOLMIUM LASER LITHOTRIPSY, AND LEFT URETERAL STENT PLACEMENT;  Surgeon: Devere Lonni Righter, MD;  Location: [facility];  Service: Urology;  Laterality: Left;  45 MINUTES   DILATATION & CURETTAGE/HYSTEROSCOPY WITH MYOSURE N/A 06/15/2023   Procedure: DILATATION & CURETTAGE/HYSTEROSCOPY WITH MYOSURE;  Surgeon: Diedre Rosaline BRAVO, MD;  Location: [facility];  Service: Gynecology;   Laterality: N/A;  instrument needed = myosure lite   HIATAL HERNIA REPAIR     with gastric sleeve   LAPAROSCOPIC CHOLECYSTECTOMY SINGLE SITE WITH INTRAOPERATIVE CHOLANGIOGRAM N/A 06/14/2017   Procedure: LAPAROSCOPIC CHOLECYSTECTOMY SINGLE SITE;  Surgeon: Sheldon Standing, MD;  Location: [facility];  Service: General;  Laterality: N/A;   LAPAROSCOPIC GASTRIC SLEEVE RESECTION  09/2013   LASER ABLATION     Vascular   NASAL SINUS SURGERY  12/2021   TONSILLECTOMY  1988   WISDOM TOOTH EXTRACTION     Patient Active Problem List   Diagnosis Date Noted   Nephrolithiasis 12/29/2022   Ureteral stone with hydronephrosis 12/29/2022   Positive ANA (antinuclear antibody) 04/12/2022   Genetic testing 07/21/2021   Family history of melanoma 07/13/2021   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   Vertigo 12/23/2011   Otalgia of both ears 09/17/2010   SLEEP DISORDER/DISTURBANCE 09/27/2008   FATIGUE 07/15/2008   ADHD 12/05/2007   HEADACHE 11/26/2007   ALLERGIC RHINITIS 10/13/2007   NEPHROLITHIASIS, HX OF 10/13/2007   NONSPECIFIC MESENTERIC LYMPHADENITIS 03/17/2007   RENAL CALCULUS, LEFT 03/17/2007   DEPRESSION 03/07/2007   DYSPNEA ON EXERTION 03/07/2007    PCP: Charlett Apolinar POUR, MD  REFERRING PROVIDER: Chalice Saunas, MD  REFERRING DIAG: G44.221 (ICD-10-CM) - Chronic tension-type headache, intractable R42 (ICD-10-CM) -  Vertigo R76.8 (ICD-10-CM) - Positive ANA (antinuclear antibody)  THERAPY DIAG:  Dizziness and giddiness  Unsteadiness on feet  ONSET DATE: 11/25/2023 (MD referral)  Rationale for Evaluation and Treatment: Rehabilitation  SUBJECTIVE:   SUBJECTIVE STATEMENT: Have had two plus years of neck pain and headaches and vertigo.  The vertigo has been extreme.  I've felt better since the addition of Mobic .  Vertigo is spinning-like the room is spinning, constant imbalance, lost security in my independence.  I'm basically bedbound.  Headaches  have gone on for years/decades, but the vertigo accompanying is worse in the past 2 years. Pt reports she has been on medications for migraines, but they are not sure the headaches are migraine-related at this point.  She is to see headache specialist, Dr. Oneita, in the coming weeks. Pt accompanied by: self  PERTINENT HISTORY: ADD, anxiety, asthma, migraines, concussion x2, Lupus, shingles   PAIN:  Are you having pain? Yes: NPRS scale: 5/10 Pain location: headache/pressure Pain description: pressure Aggravating factors: unsure Relieving factors: unsure  PRECAUTIONS: Other: Existing neck issue from years ago; she currently does not drive because of the vertigo  RED FLAGS: None   WEIGHT BEARING RESTRICTIONS: No  FALLS: Has patient fallen in last 6 months? Yes. Number of falls multiple  LIVING ENVIRONMENT: Lives with: lives with their family Lives in: House/apartment  PLOF: Independent and has been limited in previous several years-mostly bed bound, per pt report  PATIENT GOALS: To feel better, to walk more and to do more things my age  OBJECTIVE:  Note: Objective measures were completed at Evaluation unless otherwise noted.  DIAGNOSTIC FINDINGS: 10/06/23 brain MRI: MRI scan of the brain with and without contrast showing only mild changes of chronic small vessel disease. Lumbar puncture WNL findings  COGNITION: Overall cognitive status: Within functional limits for tasks assessed   SENSATION: Not tested  POSTURE:  rounded shoulders  Cervical ROM:    Active AROM (deg) eval  Flexion 36  Extension 25  Right lateral flexion   Left lateral flexion   Right rotation 55  Left rotation 55  (Blank rows = not tested)  GAIT: Gait pattern: guarded, slow pace, step through pattern, and wide BOS Distance walked: clinic distances   PATIENT SURVEYS:  DHI: 72  VESTIBULAR ASSESSMENT   GENERAL OBSERVATION: sits at the mat against the wall while providing history.  Things  that make vertigo worse are closing eyes, sometimes turning to L, the shampoo bowl when I get my hair cut.  Has a lot of light sensitivity, noise sensitivity. Vision changes on a daily basis:  some days double vision; some days not.  Some days clarity of vision is not good; not consistent No hearing changes Nausea and vomiting occur-takes Zofran  Tries to keep wide BOS, try to avoid carrying things, stairs are hard. Baseline:  feels 2/10 symptoms as imbalance   OCULOMOTOR EXAM: Slight eye strain with all activities  increased dizziness with focus on the pen, increased nausea   Ocular ROM:  slowed motion overall, feels eyes trailing with horizontal   Spontaneous Nystagmus: absent   Gaze-Induced Nystagmus: absent   Smooth Pursuits: intact   Saccades: slight, small saccadic eye movements L eye with saccades to L and return to center   Convergence/Divergence: 12 cm    VESTIBULAR - OCULAR REFLEX:    Slow VOR: Comment: slowed, once stopped she continues to feel boat type motion 5/10, able to go down to 2-3/10, nausea   VOR Cancellation: Comment: increased symptoms, slowed  Head-Impulse Test: NT    Dynamic Visual Acuity: NT     POSITIONAL TESTING: Did not assess due to pt not complaining of spinning or increased symptoms specific to position changes       MOTION SENSITIVITY:  NOT TESTED AT EVAL    Motion Sensitivity Quotient  Intensity: 0 = none, 1 = Lightheaded, 2 = Mild, 3 = Moderate, 4 = Severe, 5 = Vomiting  Intensity  1. Sitting to supine   2. Supine to L side   3. Supine to R side   4. Supine to sitting   5. L Hallpike-Dix   6. Up from L    7. R Hallpike-Dix   8. Up from R    9. Sitting, head  tipped to L knee   10. Head up from L  knee   11. Sitting, head  tipped to R knee   12. Head up from R  knee   13. Sitting head turns x5   14.Sitting head nods x5   15. In stance, 180  turn to L    16. In stance, 180  turn to R                                                                                                                                    TREATMENT DATE: 12/06/2023    PATIENT EDUCATION: Education details: Eval results, POC, rationale for habituation exercises to improve functional mobility, decrease dizziness Person educated: Patient Education method: Explanation Education comprehension: verbalized understanding  HOME EXERCISE PROGRAM:  GOALS: Goals reviewed with patient? Yes  SHORT TERM GOALS: Target date: 12/30/2023  Patient to be independent with initial HEP. Baseline: HEP initiated Goal status: INITIAL    LONG TERM GOALS: Target date: 01/13/2024  Patient to be independent with advanced HEP. Baseline: Not yet initiated  Goal status: INITIAL  Patient to report 0/10 dizziness with standing vertical and horizontal VOR for 30 seconds. Baseline: Unable Goal status: INITIAL  Patient will report 0/10 dizziness with mobility tasks.  Baseline: Symptomatic  Goal status: INITIAL  Patient to demonstrate mod sway 30 sec, with M-CTSIB condition with eyes closed/foam surface in order to improve safety in environments with uneven surfaces and dim lighting. Baseline: unable Goal status: INITIAL  Patient to score at least 20/24 on DGI in order to decrease risk of falls. Baseline: TBD Goal status: INITIAL  Patient to score at least 18 points less on DHI in order to meet MCID and improve functional outcomes.  Baseline: 72 Goal status: INITIAL    ASSESSMENT:  CLINICAL IMPRESSION:   Patient is a 53 y/o F presenting to OPPT with c/o dizziness for the past 2 + years.  She has had headaches for much longer, but her vertigo in the past 2 years is physically limiting her functional mobility in and outside of the home.  She has hx of lupus, migraines (to see a new headache specialist  soon), diplopia, concussion (not recent); MRI imaging and lumbar puncture are unremarkable.  She does have remote hx of cervical spine nerve  impingement.  Her dizziness and vertigo sensations make her feel like she is on a boat and very unsteady with gait.    Oculomotor exam revealed slowed motion and initial dizziness and nausea to pen tip with attempts at ocular motion. She does not appear to have nystagmus.  Convergence is increased at 12 cm.  She becomes most symptomatic with VOR cancellation and slowed VOR.  Not able to progress further with testing due to time constraints of eval and pt's increaseing symptoms that do not fully resolve between ocular and VOR testing.  Did not attempt positional testing due to pt not having complaints of spinning or position changes inducing increased vertigo symptoms.  Patient was educated on rationale for therapy to address above stated deficits to improve overall functional mobility, decreased dizziness and  reported understanding.   OBJECTIVE IMPAIRMENTS: Abnormal gait, decreased balance, decreased mobility, dizziness, and pain.   ACTIVITY LIMITATIONS: standing, squatting, stairs, transfers, reach over head, locomotion level, and caring for others  PARTICIPATION LIMITATIONS: meal prep, cleaning, laundry, driving, shopping, and community activity  PERSONAL FACTORS: 3+ comorbidities: see PMH above are also affecting patient's functional outcome.   REHAB POTENTIAL: Good  CLINICAL DECISION MAKING: Evolving/moderate complexity  EVALUATION COMPLEXITY: Moderate   PLAN:  PT FREQUENCY: 1-2x/week  PT DURATION: 6 weeks including PT eval week  PLANNED INTERVENTIONS: 97750- Physical Performance Testing, 97110-Therapeutic exercises, 97530- Therapeutic activity, [id number]- Neuromuscular re-education, 97535- Self Care, [id number]- Manual therapy, [phone]- Gait training, Patient/Family education, Balance training, Vestibular training, and DME instructions  PLAN FOR NEXT SESSION: Assess DGI; consider seated aerobic activity to promote improved activity level-monitor vitals.  Habituation exercises (may need to  ask/assess MSQ); initiate HEP-VOR exercises   Elenore Wanninger W., PT 12/07/2023, 3:39 PM  [facility] [address] [place], [address] [place], [place] [address] Phone # [phone] Fax # [phone]

## 2023-12-07 ENCOUNTER — Encounter: Payer: Self-pay | Admitting: Physical Therapy

## 2023-12-07 ENCOUNTER — Other Ambulatory Visit: Payer: Self-pay

## 2023-12-09 ENCOUNTER — Ambulatory Visit: Payer: Self-pay

## 2023-12-09 DIAGNOSIS — R42 Dizziness and giddiness: Secondary | ICD-10-CM | POA: Diagnosis not present

## 2023-12-09 DIAGNOSIS — R2681 Unsteadiness on feet: Secondary | ICD-10-CM

## 2023-12-09 DIAGNOSIS — R293 Abnormal posture: Secondary | ICD-10-CM

## 2023-12-09 NOTE — Therapy (Signed)
 OUTPATIENT PHYSICAL THERAPY VESTIBULAR EVALUATION     Patient Name: Jordan Mayer MRN: [id number] DOB:1970/06/03, 53 y.o., female Today's Date: 12/09/2023  END OF SESSION:  PT End of Session - 12/09/23 0931     Visit Number 2    Number of Visits 12    Date for PT Re-Evaluation 01/13/24    Authorization Type [employer]-No VL    PT Start Time 0930    PT Stop Time 1015    PT Time Calculation (min) 45 min    Activity Tolerance Other (comment)   limited by nausea, dizziness   Behavior During Therapy WFL for tasks assessed/performed          Past Medical History:  Diagnosis Date   ADD (attention deficit disorder)    Allergy    Anxiety    Asthma    Chicken pox    as child   Chronic migraines    Concussion    x 2 no residual   Depression    Family history of melanoma    Gallstones    GERD (gastroesophageal reflux disease)    Kidney stones    Lupus    PCOS (polycystic ovarian syndrome)    Pneumonia 2003   Renal disorder    kidney disease   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    Shingles    Past Surgical History:  Procedure Laterality Date   BREAST BIOPSY Left    2019   BUNIONECTOMY Right    CHOLECYSTECTOMY     COLONOSCOPY     CYSTOSCOPY W/ URETERAL STENT PLACEMENT Left 12/29/2022   Procedure: CYSTOSCOPY WITH LEFT RETROGRADE PYELOGRAM/URETERAL STENT PLACEMENT;  Surgeon: Gaston Hamilton, MD;  Location: [facility];  Service: Urology;  Laterality: Left;   CYSTOSCOPY/URETEROSCOPY/HOLMIUM LASER/STENT PLACEMENT Left 01/05/2023   Procedure: CYSTOSCOPY, LEFT URETEROSCOPY, HOLMIUM LASER LITHOTRIPSY, AND LEFT URETERAL STENT PLACEMENT;  Surgeon: Devere Lonni Righter, MD;  Location: [facility];  Service: Urology;  Laterality: Left;  45 MINUTES   DILATATION & CURETTAGE/HYSTEROSCOPY WITH MYOSURE N/A 06/15/2023   Procedure: DILATATION & CURETTAGE/HYSTEROSCOPY WITH MYOSURE;  Surgeon: Diedre Rosaline BRAVO, MD;  Location: [facility];  Service: Gynecology;   Laterality: N/A;  instrument needed = myosure lite   HIATAL HERNIA REPAIR     with gastric sleeve   LAPAROSCOPIC CHOLECYSTECTOMY SINGLE SITE WITH INTRAOPERATIVE CHOLANGIOGRAM N/A 06/14/2017   Procedure: LAPAROSCOPIC CHOLECYSTECTOMY SINGLE SITE;  Surgeon: Sheldon Standing, MD;  Location: [facility];  Service: General;  Laterality: N/A;   LAPAROSCOPIC GASTRIC SLEEVE RESECTION  09/2013   LASER ABLATION     Vascular   NASAL SINUS SURGERY  12/2021   TONSILLECTOMY  1988   WISDOM TOOTH EXTRACTION     Patient Active Problem List   Diagnosis Date Noted   Nephrolithiasis 12/29/2022   Ureteral stone with hydronephrosis 12/29/2022   Positive ANA (antinuclear antibody) 04/12/2022   Genetic testing 07/21/2021   Family history of melanoma 07/13/2021   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   Vertigo 12/23/2011   Otalgia of both ears 09/17/2010   SLEEP DISORDER/DISTURBANCE 09/27/2008   FATIGUE 07/15/2008   ADHD 12/05/2007   HEADACHE 11/26/2007   ALLERGIC RHINITIS 10/13/2007   NEPHROLITHIASIS, HX OF 10/13/2007   NONSPECIFIC MESENTERIC LYMPHADENITIS 03/17/2007   RENAL CALCULUS, LEFT 03/17/2007   DEPRESSION 03/07/2007   DYSPNEA ON EXERTION 03/07/2007    PCP: Charlett Apolinar POUR, MD  REFERRING PROVIDER: Chalice Saunas, MD  REFERRING DIAG: G44.221 (ICD-10-CM) - Chronic tension-type headache, intractable R42 (ICD-10-CM) -  Vertigo R76.8 (ICD-10-CM) - Positive ANA (antinuclear antibody)  THERAPY DIAG:  Dizziness and giddiness  Unsteadiness on feet  Abnormal posture  ONSET DATE: 11/25/2023 (MD referral)  Rationale for Evaluation and Treatment: Rehabilitation  SUBJECTIVE:   SUBJECTIVE STATEMENT: Still symptomatic w/ HA, dizziness, neck pain. Recently was on a steroid medication which did not seem to affect neck pain. Notes that the NSAID that she is taking has helped the dizziness.    From Evaluation: Have had two plus years of neck pain and  headaches and vertigo.  The vertigo has been extreme.  I've felt better since the addition of Mobic .  Vertigo is spinning-like the room is spinning, constant imbalance, lost security in my independence.  I'm basically bedbound.  Headaches have gone on for years/decades, but the vertigo accompanying is worse in the past 2 years. Pt reports she has been on medications for migraines, but they are not sure the headaches are migraine-related at this point.  She is to see headache specialist, Dr. Oneita, in the coming weeks. Pt accompanied by: self  PERTINENT HISTORY: ADD, anxiety, asthma, migraines, concussion x2, Lupus, shingles   PAIN:  Are you having pain? Yes: NPRS scale: HA: 5/10; dizziness: 6/10; neck pain 6/10 Pain location: headache/pressure Pain description: pressure Aggravating factors: unsure Relieving factors: unsure  PRECAUTIONS: Other: Existing neck issue from years ago; she currently does not drive because of the vertigo  RED FLAGS: None   WEIGHT BEARING RESTRICTIONS: No  FALLS: Has patient fallen in last 6 months? Yes. Number of falls multiple  LIVING ENVIRONMENT: Lives with: lives with their family Lives in: House/apartment  PLOF: Independent and has been limited in previous several years-mostly bed bound, per pt report  PATIENT GOALS: To feel better, to walk more and to do more things my age  OBJECTIVE:   TODAY'S TREATMENT: 12/09/23 Activity Comments  105/81 mmHg, 103 bpm seated  Dynamic Gait Index 21/24--low risk for falls  Baseline symptoms 2/10   MSQ 24.9 = moderate  Cervical extension SNAG Reports increased dizziness  Seated nose to left knee 1x5    Access Code: [email or web address] URL: [email or web address] Date: 12/09/2023 Prepared by: Burnard Sandifer  Exercises - Mid-Lower Cervical Extension SNAG with Strap  - 1 x daily - 7 x weekly - 1 sets - 10 reps - 2-3 sec hold - Upper Cervical Extension SNAG with Strap  - 1 x daily - 7 x weekly - 1 sets - 10  reps - 2-3 sec hold - Seated Nose to Left Knee Vestibular Habituation  - 1 x daily - 7 x weekly - 5 reps   [facility] PT Assessment - 12/09/23 0001       Standardized Balance Assessment   Standardized Balance Assessment Dynamic Gait Index      Dynamic Gait Index   Level Surface Normal    Change in Gait Speed Normal    Gait with Horizontal Head Turns Mild Impairment    Gait with Vertical Head Turns Mild Impairment    Gait and Pivot Turn Normal    Step Over Obstacle Normal    Step Around Obstacles Normal    Steps Mild Impairment    Total Score 21          Note: Objective measures were completed at Evaluation unless otherwise noted.  DIAGNOSTIC FINDINGS: 10/06/23 brain MRI: MRI scan of the brain with and without contrast showing only mild changes of chronic small vessel disease. Lumbar puncture WNL findings  COGNITION: Overall cognitive status: Within functional limits  for tasks assessed   SENSATION: Not tested  POSTURE:  rounded shoulders  Cervical ROM:    Active AROM (deg) eval  Flexion 36  Extension 25  Right lateral flexion   Left lateral flexion   Right rotation 55  Left rotation 55  (Blank rows = not tested)  GAIT: Gait pattern: guarded, slow pace, step through pattern, and wide BOS Distance walked: clinic distances   PATIENT SURVEYS:  DHI: 72  VESTIBULAR ASSESSMENT   GENERAL OBSERVATION: sits at the mat against the wall while providing history.  Things that make vertigo worse are closing eyes, sometimes turning to L, the shampoo bowl when I get my hair cut.  Has a lot of light sensitivity, noise sensitivity. Vision changes on a daily basis:  some days double vision; some days not.  Some days clarity of vision is not good; not consistent No hearing changes Nausea and vomiting occur-takes Zofran  Tries to keep wide BOS, try to avoid carrying things, stairs are hard. Baseline:  feels 2/10 symptoms as imbalance   OCULOMOTOR EXAM: Slight eye strain with all  activities  increased dizziness with focus on the pen, increased nausea   Ocular ROM:  slowed motion overall, feels eyes trailing with horizontal   Spontaneous Nystagmus: absent   Gaze-Induced Nystagmus: absent   Smooth Pursuits: intact   Saccades: slight, small saccadic eye movements L eye with saccades to L and return to center   Convergence/Divergence: 12 cm    VESTIBULAR - OCULAR REFLEX:    Slow VOR: Comment: slowed, once stopped she continues to feel boat type motion 5/10, able to go down to 2-3/10, nausea   VOR Cancellation: Comment: increased symptoms, slowed   Head-Impulse Test: NT    Dynamic Visual Acuity: NT     POSITIONAL TESTING: Did not assess due to pt not complaining of spinning or increased symptoms specific to position changes       MOTION SENSITIVITY:  NOT TESTED AT EVAL    Motion Sensitivity Quotient 12/09/23  Intensity: 0 = none, 1 = Lightheaded, 2 = Mild, 3 = Moderate, 4 = Severe, 5 = Vomiting  Intensity  1. Sitting to supine 1--  2. Supine to L side 0  3. Supine to R side 1  4. Supine to sitting 0  5. L Hallpike-Dix 0--uncomfortable  6. Up from L Dix-Hallpike 1  7. R Hallpike-Dix 0  8. Up from R  2  9. Sitting, head  tipped to L knee 1  10. Head up from L  knee 3  11. Sitting, head  tipped to R knee 0  12. Head up from R  knee 0  13. Sitting head turns x5 3 (when stops motion)  14.Sitting head nods x5 2  15. In stance, 180  turn to L  4  16. In stance, 180  turn to R 3    All positions requiring > 30 sec for resolution   (10 x 51 raw score x 100)/2048= 24.9 = moderate  TREATMENT DATE: 12/06/2023    PATIENT EDUCATION: Education details: Eval results, POC, rationale for habituation exercises to improve functional mobility, decrease dizziness Person educated: Patient Education method: Explanation Education  comprehension: verbalized understanding  HOME EXERCISE PROGRAM:  GOALS: Goals reviewed with patient? Yes  SHORT TERM GOALS: Target date: 12/30/2023  Patient to be independent with initial HEP. Baseline: HEP initiated Goal status: INITIAL    LONG TERM GOALS: Target date: 01/13/2024  Patient to be independent with advanced HEP. Baseline: Not yet initiated  Goal status: INITIAL  Patient to report 0/10 dizziness with standing vertical and horizontal VOR for 30 seconds. Baseline: Unable Goal status: INITIAL  Patient will report 0/10 dizziness with mobility tasks.  Baseline: Symptomatic  Goal status: INITIAL  Patient to demonstrate mod sway 30 sec, with M-CTSIB condition with eyes closed/foam surface in order to improve safety in environments with uneven surfaces and dim lighting. Baseline: unable Goal status: INITIAL  Patient to score at least 20/24 on DGI in order to decrease risk of falls. Baseline: 21/24 Goal status: MET  Patient to score at least 18 points less on DHI in order to meet MCID and improve functional outcomes.  Baseline: 72 Goal status: INITIAL    ASSESSMENT:  CLINICAL IMPRESSION:  Vitals at rest reveal decreased blood pressure and somewhat tachycardic at 103 bpm, although she reports she had just finished drinking coffee.  Dynamic Gait Index score 21/24 indicating low risk for falls. During static standing observe near constant sway back/forth with pt often relying on holding to door frame or wall for stability. Motion Sensitivity Quotient performed with 10 items provoking and total score of 24.9 indicating moderate moderate motion sensitivity.  Initiated HEP on this date to address issues with gentle habituation and pt education verbalizing understanding. Continued sessions to progress POC details to improve mobility and reduce dysfunction.    OBJECTIVE IMPAIRMENTS: Abnormal gait, decreased balance, decreased mobility, dizziness, and pain.   ACTIVITY  LIMITATIONS: standing, squatting, stairs, transfers, reach over head, locomotion level, and caring for others  PARTICIPATION LIMITATIONS: meal prep, cleaning, laundry, driving, shopping, and community activity  PERSONAL FACTORS: 3+ comorbidities: see PMH above are also affecting patient's functional outcome.   REHAB POTENTIAL: Good  CLINICAL DECISION MAKING: Evolving/moderate complexity  EVALUATION COMPLEXITY: Moderate   PLAN:  PT FREQUENCY: 1-2x/week  PT DURATION: 6 weeks including PT eval week  PLANNED INTERVENTIONS: 97750- Physical Performance Testing, 97110-Therapeutic exercises, 97530- Therapeutic activity, [id number]- Neuromuscular re-education, 97535- Self Care, [id number]- Manual therapy, [phone]- Gait training, Patient/Family education, Balance training, Vestibular training, and DME instructions  PLAN FOR NEXT SESSION: ; consider seated aerobic activity to promote improved activity level-monitor vitals.  Habituation exercises ; initiate HEP-VOR exercises   Jonette MARLA Sandifer, PT 12/09/2023, 9:31 AM  [facility] at [facility] [address] [place], [address] [place], [place] [address] Phone # [phone] Fax # [phone]

## 2023-12-14 ENCOUNTER — Ambulatory Visit: Admitting: Physical Therapy

## 2023-12-14 ENCOUNTER — Encounter: Payer: Self-pay | Admitting: Physical Therapy

## 2023-12-14 DIAGNOSIS — R42 Dizziness and giddiness: Secondary | ICD-10-CM

## 2023-12-14 DIAGNOSIS — R293 Abnormal posture: Secondary | ICD-10-CM

## 2023-12-14 DIAGNOSIS — R2681 Unsteadiness on feet: Secondary | ICD-10-CM

## 2023-12-14 NOTE — Telephone Encounter (Signed)
 Final report received, original placed in review for MD.

## 2023-12-14 NOTE — Therapy (Signed)
 OUTPATIENT PHYSICAL THERAPY TREATMENT     Patient Name: Jordan Mayer MRN: [id number] DOB:1971/03/15, 53 y.o., female Today's Date: 12/14/2023  END OF SESSION:  PT End of Session - 12/14/23 0931     Visit Number 3    Number of Visits 12    Date for PT Re-Evaluation 01/13/24    Authorization Type [employer]-No VL    PT Start Time 0932    PT Stop Time 1010    PT Time Calculation (min) 38 min    Activity Tolerance Other (comment)   limited by nausea, dizziness   Behavior During Therapy WFL for tasks assessed/performed           Past Medical History:  Diagnosis Date   ADD (attention deficit disorder)    Allergy    Anxiety    Asthma    Chicken pox    as child   Chronic migraines    Concussion    x 2 no residual   Depression    Family history of melanoma    Gallstones    GERD (gastroesophageal reflux disease)    Kidney stones    Lupus    PCOS (polycystic ovarian syndrome)    Pneumonia 2003   Renal disorder    kidney disease   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    Shingles    Past Surgical History:  Procedure Laterality Date   BREAST BIOPSY Left    2019   BUNIONECTOMY Right    CHOLECYSTECTOMY     COLONOSCOPY     CYSTOSCOPY W/ URETERAL STENT PLACEMENT Left 12/29/2022   Procedure: CYSTOSCOPY WITH LEFT RETROGRADE PYELOGRAM/URETERAL STENT PLACEMENT;  Surgeon: Gaston Hamilton, MD;  Location: [facility];  Service: Urology;  Laterality: Left;   CYSTOSCOPY/URETEROSCOPY/HOLMIUM LASER/STENT PLACEMENT Left 01/05/2023   Procedure: CYSTOSCOPY, LEFT URETEROSCOPY, HOLMIUM LASER LITHOTRIPSY, AND LEFT URETERAL STENT PLACEMENT;  Surgeon: Devere Lonni Righter, MD;  Location: [facility];  Service: Urology;  Laterality: Left;  45 MINUTES   DILATATION & CURETTAGE/HYSTEROSCOPY WITH MYOSURE N/A 06/15/2023   Procedure: DILATATION & CURETTAGE/HYSTEROSCOPY WITH MYOSURE;  Surgeon: Diedre Rosaline BRAVO, MD;  Location: [facility] ;  Service: Gynecology;  Laterality: N/A;   instrument needed = myosure lite   HIATAL HERNIA REPAIR     with gastric sleeve   LAPAROSCOPIC CHOLECYSTECTOMY SINGLE SITE WITH INTRAOPERATIVE CHOLANGIOGRAM N/A 06/14/2017   Procedure: LAPAROSCOPIC CHOLECYSTECTOMY SINGLE SITE;  Surgeon: Sheldon Standing, MD;  Location: [facility];  Service: General;  Laterality: N/A;   LAPAROSCOPIC GASTRIC SLEEVE RESECTION  09/2013   LASER ABLATION     Vascular   NASAL SINUS SURGERY  12/2021   TONSILLECTOMY  1988   WISDOM TOOTH EXTRACTION     Patient Active Problem List   Diagnosis Date Noted   Nephrolithiasis 12/29/2022   Ureteral stone with hydronephrosis 12/29/2022   Positive ANA (antinuclear antibody) 04/12/2022   Genetic testing 07/21/2021   Family history of melanoma 07/13/2021   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   Vertigo 12/23/2011   Otalgia of both ears 09/17/2010   SLEEP DISORDER/DISTURBANCE 09/27/2008   FATIGUE 07/15/2008   ADHD 12/05/2007   HEADACHE 11/26/2007   ALLERGIC RHINITIS 10/13/2007   NEPHROLITHIASIS, HX OF 10/13/2007   NONSPECIFIC MESENTERIC LYMPHADENITIS 03/17/2007   RENAL CALCULUS, LEFT 03/17/2007   DEPRESSION 03/07/2007   DYSPNEA ON EXERTION 03/07/2007    PCP: Charlett Apolinar POUR, MD  REFERRING PROVIDER: Chalice Saunas, MD  REFERRING DIAG: G44.221 (ICD-10-CM) - Chronic tension-type headache, intractable R42 (ICD-10-CM) -  Vertigo R76.8 (ICD-10-CM) - Positive ANA (antinuclear antibody)  THERAPY DIAG:  Dizziness and giddiness  Unsteadiness on feet  Abnormal posture  ONSET DATE: 11/25/2023 (MD referral)  Rationale for Evaluation and Treatment: Rehabilitation  SUBJECTIVE:   SUBJECTIVE STATEMENT: Pt states she woke up this morning with vertigo -- feels more imbalance but not spinning. Neck is not hurting more than usual.    From Evaluation: Have had two plus years of neck pain and headaches and vertigo.  The vertigo has been extreme.  I've felt better since the  addition of Mobic .  Vertigo is spinning-like the room is spinning, constant imbalance, lost security in my independence.  I'm basically bedbound.  Headaches have gone on for years/decades, but the vertigo accompanying is worse in the past 2 years. Pt reports she has been on medications for migraines, but they are not sure the headaches are migraine-related at this point.  She is to see headache specialist, Dr. Oneita, in the coming weeks. Pt accompanied by: self  PERTINENT HISTORY: ADD, anxiety, asthma, migraines, concussion x2, Lupus, shingles   PAIN: 12/14/23 Are you having pain? Yes: NPRS scale: HA: 5/10; dizziness: 6 or 7/10; neck pain 6/10 Pain location: headache/pressure Pain description: pressure Aggravating factors: unsure Relieving factors: unsure  PRECAUTIONS: Other: Existing neck issue from years ago; she currently does not drive because of the vertigo  RED FLAGS: None   WEIGHT BEARING RESTRICTIONS: No  FALLS: Has patient fallen in last 6 months? Yes. Number of falls multiple  LIVING ENVIRONMENT: Lives with: lives with their family Lives in: House/apartment  PLOF: Independent and has been limited in previous several years-mostly bed bound, per pt report  PATIENT GOALS: To feel better, to walk more and to do more things my age  OBJECTIVE:  TODAY'S TREATMENT: 12/14/2023 Activity Comments  UBE L1; 2 min fwd, 2 min bwd seated  Manual therapy STM & TPR suboccipitals, cervical paraspinals, UTs Lateral glides grade II to III 21/24--low risk for falls  Supine  Cervical retraction 10x3 Cervical rotation iso 3x5 R&L Deep neck flexor training 3x5    Fatigues within 5 sec  Cawthorne cooksey exercises in supine: Eyes up/down x30 Head nods x10 Head turns x10     No increase in symptoms Very mild increase Increased dizziness by .5 points things are moving even when we stop  Supine VOR x 1 head turns; 2x30 Most dizzy with this, had to stop to readjust and  decrease speed and cervical ROM with cueing  Supine smooth pursuit attempted   Seated smooth pursuit with plain background horizontal and vertical 2x30 Pt became too dizzy with the pattern of the ceiling       TREATMENT: 12/09/23 Activity Comments  105/81 mmHg, 103 bpm seated  Dynamic Gait Index 21/24--low risk for falls  Baseline symptoms 2/10   MSQ 24.9 = moderate  Cervical extension SNAG Reports increased dizziness  Seated nose to left knee 1x5       Note: Objective measures were completed at Evaluation unless otherwise noted.  DIAGNOSTIC FINDINGS: 10/06/23 brain MRI: MRI scan of the brain with and without contrast showing only mild changes of chronic small vessel disease. Lumbar puncture WNL findings  COGNITION: Overall cognitive status: Within functional limits for tasks assessed   SENSATION: Not tested  POSTURE:  rounded shoulders  Cervical ROM:    Active AROM (deg) eval  Flexion 36  Extension 25  Right lateral flexion   Left lateral flexion   Right rotation 55  Left rotation 55  (  Blank rows = not tested)  GAIT: Gait pattern: guarded, slow pace, step through pattern, and wide BOS Distance walked: clinic distances   PATIENT SURVEYS:  DHI: 72  VESTIBULAR ASSESSMENT   GENERAL OBSERVATION: sits at the mat against the wall while providing history.  Things that make vertigo worse are closing eyes, sometimes turning to L, the shampoo bowl when I get my hair cut.  Has a lot of light sensitivity, noise sensitivity. Vision changes on a daily basis:  some days double vision; some days not.  Some days clarity of vision is not good; not consistent No hearing changes Nausea and vomiting occur-takes Zofran  Tries to keep wide BOS, try to avoid carrying things, stairs are hard. Baseline:  feels 2/10 symptoms as imbalance   OCULOMOTOR EXAM: Slight eye strain with all activities  increased dizziness with focus on the pen, increased nausea   Ocular ROM:  slowed motion  overall, feels eyes trailing with horizontal   Spontaneous Nystagmus: absent   Gaze-Induced Nystagmus: absent   Smooth Pursuits: intact   Saccades: slight, small saccadic eye movements L eye with saccades to L and return to center   Convergence/Divergence: 12 cm    VESTIBULAR - OCULAR REFLEX:    Slow VOR: Comment: slowed, once stopped she continues to feel boat type motion 5/10, able to go down to 2-3/10, nausea   VOR Cancellation: Comment: increased symptoms, slowed   Head-Impulse Test: NT    Dynamic Visual Acuity: NT     POSITIONAL TESTING: Did not assess due to pt not complaining of spinning or increased symptoms specific to position changes       MOTION SENSITIVITY:  NOT TESTED AT EVAL    Motion Sensitivity Quotient 12/09/23  Intensity: 0 = none, 1 = Lightheaded, 2 = Mild, 3 = Moderate, 4 = Severe, 5 = Vomiting  Intensity  1. Sitting to supine 1--  2. Supine to L side 0  3. Supine to R side 1  4. Supine to sitting 0  5. L Hallpike-Dix 0--uncomfortable  6. Up from L Dix-Hallpike 1  7. R Hallpike-Dix 0  8. Up from R  2  9. Sitting, head  tipped to L knee 1  10. Head up from L  knee 3  11. Sitting, head  tipped to R knee 0  12. Head up from R  knee 0  13. Sitting head turns x5 3 (when stops motion)  14.Sitting head nods x5 2  15. In stance, 180  turn to L  4  16. In stance, 180  turn to R 3    All positions requiring > 30 sec for resolution   (10 x 51 raw score x 100)/2048= 24.9 = moderate                                                                                                                            PATIENT EDUCATION: Education details: Eval results, POC, rationale for habituation exercises  to improve functional mobility, decrease dizziness Person educated: Patient Education method: Explanation Education comprehension: verbalized understanding  HOME EXERCISE PROGRAM: Access Code: [email or web address] URL: [email or web address] Date:  12/09/2023 Prepared by: Burnard Sandifer  Exercises - Mid-Lower Cervical Extension SNAG with Strap  - 1 x daily - 7 x weekly - 1 sets - 10 reps - 2-3 sec hold - Upper Cervical Extension SNAG with Strap  - 1 x daily - 7 x weekly - 1 sets - 10 reps - 2-3 sec hold - Seated Nose to Left Knee Vestibular Habituation  - 1 x daily - 7 x weekly - 5 reps   Access Code: [email or web address] URL: [email or web address] Date: 12/14/2023 Prepared by: Chezney Huether April Earnie Starring  Exercises - Supine Cervical Flexion Extension on Pillow  - 1 x daily - 7 x weekly - 1 sets - 10 reps - Supine Cervical Rotation AROM on Pillow  - 1 x daily - 7 x weekly - 1 sets - 10 reps - Seated Gaze Stabilization with Head Nod  - 1 x daily - 7 x weekly - 2 sets - 20 sec hold - Seated Gaze Stabilization with Head Rotation  - 1 x daily - 7 x weekly - 2 sets - 20 sec hold - Seated Horizontal Smooth Pursuit  - 1 x daily - 7 x weekly - 2 sets - 30 sec hold - Seated Vertical Smooth Pursuit  - 1 x daily - 7 x weekly - 2 sets - 30 sec hold  GOALS: Goals reviewed with patient? Yes  SHORT TERM GOALS: Target date: 12/30/2023  Patient to be independent with initial HEP. Baseline: HEP initiated Goal status: INITIAL    LONG TERM GOALS: Target date: 01/13/2024  Patient to be independent with advanced HEP. Baseline: Not yet initiated  Goal status: INITIAL  Patient to report 0/10 dizziness with standing vertical and horizontal VOR for 30 seconds. Baseline: Unable Goal status: INITIAL  Patient will report 0/10 dizziness with mobility tasks.  Baseline: Symptomatic  Goal status: INITIAL  Patient to demonstrate mod sway 30 sec, with M-CTSIB condition with eyes closed/foam surface in order to improve safety in environments with uneven surfaces and dim lighting. Baseline: unable Goal status: INITIAL  Patient to score at least 20/24 on DGI in order to decrease risk of falls. Baseline: 21/24 Goal status: MET  Patient to score at  least 18 points less on DHI in order to meet MCID and improve functional outcomes.  Baseline: 72 Goal status: INITIAL    ASSESSMENT:  CLINICAL IMPRESSION: Reports increased dizziness this morning. Worked on aerobics with UBE. Manual work for neck pain, initiated cervical strengthening/ROM. Fatigued with deep neck flexor stabilizer activation. Initiated gaze stabilization exercises and habituation exercises with pt most symptomatic with VOR x 1 exercises in horizontal plane. Difficulty performing smooth pursuit with busy ceiling background in supine -- better in sitting with plain blue wall as background. No issues with supine Cawthorne Cooksey exercises for habituation.   OBJECTIVE IMPAIRMENTS: Abnormal gait, decreased balance, decreased mobility, dizziness, and pain.   ACTIVITY LIMITATIONS: standing, squatting, stairs, transfers, reach over head, locomotion level, and caring for others  PARTICIPATION LIMITATIONS: meal prep, cleaning, laundry, driving, shopping, and community activity  PERSONAL FACTORS: 3+ comorbidities: see PMH above are also affecting patient's functional outcome.   REHAB POTENTIAL: Good  CLINICAL DECISION MAKING: Evolving/moderate complexity  EVALUATION COMPLEXITY: Moderate   PLAN:  PT FREQUENCY: 1-2x/week  PT DURATION: 6 weeks including PT eval week  PLANNED INTERVENTIONS: 97750- Physical Performance Testing, 97110-Therapeutic  exercises, 97530- Therapeutic activity, [id number]- Neuromuscular re-education, [phone]- Self Care, 02859- Manual therapy, [phone]- Gait training, Patient/Family education, Balance training, Vestibular training, and DME instructions  PLAN FOR NEXT SESSION: ; consider seated aerobic activity to promote improved activity level-monitor vitals.  Habituation exercises ; initiate HEP-VOR exercises, cervical ROM/deep neck stabilizer strengthening   Cassanda Walmer April Honor LITTIE Starring, PT, DPT 12/14/2023, 9:32 AM  [facility] at [facility] [address] [place], [address] [place], [place] [address] Phone # [phone] Fax # [phone]

## 2023-12-19 ENCOUNTER — Ambulatory Visit: Attending: Neurology | Admitting: Physical Therapy

## 2023-12-19 ENCOUNTER — Encounter: Payer: Self-pay | Admitting: Physical Therapy

## 2023-12-19 ENCOUNTER — Other Ambulatory Visit (HOSPITAL_COMMUNITY): Payer: Self-pay

## 2023-12-19 ENCOUNTER — Other Ambulatory Visit (HOSPITAL_BASED_OUTPATIENT_CLINIC_OR_DEPARTMENT_OTHER): Payer: Self-pay

## 2023-12-19 ENCOUNTER — Telehealth: Payer: Self-pay

## 2023-12-19 DIAGNOSIS — R42 Dizziness and giddiness: Secondary | ICD-10-CM | POA: Diagnosis present

## 2023-12-19 DIAGNOSIS — R293 Abnormal posture: Secondary | ICD-10-CM | POA: Insufficient documentation

## 2023-12-19 DIAGNOSIS — R2681 Unsteadiness on feet: Secondary | ICD-10-CM | POA: Diagnosis present

## 2023-12-19 NOTE — Telephone Encounter (Signed)
 Pharmacy Patient Advocate Encounter   Received notification from CoverMyMeds that prior authorization for Ubrelvy  100MG  tablets is required/requested.   Insurance verification completed.   The patient is insured through [facility] .   Per test claim: PA required; PA submitted to above mentioned insurance via CoverMyMeds Key/confirmation #/EOC [id number] Status is pending

## 2023-12-19 NOTE — Therapy (Addendum)
 OUTPATIENT PHYSICAL THERAPY TREATMENT     Patient Name: Jordan Mayer MRN: [id number] DOB:May 10, 1971, 53 y.o., female Today's Date: 12/19/2023  END OF SESSION:  PT End of Session - 12/19/23 1102     Visit Number 4    Number of Visits 12    Date for PT Re-Evaluation 01/13/24    Authorization Type [employer]-No VL    PT Start Time 1105    PT Stop Time 1145    PT Time Calculation (min) 40 min    Activity Tolerance Patient tolerated treatment well   some nausea with vertical gaze stabilization   Behavior During Therapy WFL for tasks assessed/performed            Past Medical History:  Diagnosis Date   ADD (attention deficit disorder)    Allergy    Anxiety    Asthma    Chicken pox    as child   Chronic migraines    Concussion    x 2 no residual   Depression    Family history of melanoma    Gallstones    GERD (gastroesophageal reflux disease)    Kidney stones    Lupus    PCOS (polycystic ovarian syndrome)    Pneumonia 2003   Renal disorder    kidney disease   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    Shingles    Past Surgical History:  Procedure Laterality Date   BREAST BIOPSY Left    2019   BUNIONECTOMY Right    CHOLECYSTECTOMY     COLONOSCOPY     CYSTOSCOPY W/ URETERAL STENT PLACEMENT Left 12/29/2022   Procedure: CYSTOSCOPY WITH LEFT RETROGRADE PYELOGRAM/URETERAL STENT PLACEMENT;  Surgeon: Gaston Hamilton, MD;  Location: [facility];  Service: Urology;  Laterality: Left;   CYSTOSCOPY/URETEROSCOPY/HOLMIUM LASER/STENT PLACEMENT Left 01/05/2023   Procedure: CYSTOSCOPY, LEFT URETEROSCOPY, HOLMIUM LASER LITHOTRIPSY, AND LEFT URETERAL STENT PLACEMENT;  Surgeon: Devere Lonni Righter, MD;  Location: [facility];  Service: Urology;  Laterality: Left;  45 MINUTES   DILATATION & CURETTAGE/HYSTEROSCOPY WITH MYOSURE N/A 06/15/2023   Procedure: DILATATION & CURETTAGE/HYSTEROSCOPY WITH MYOSURE;  Surgeon: Diedre Rosaline BRAVO, MD;  Location: [facility];   Service: Gynecology;  Laterality: N/A;  instrument needed = myosure lite   HIATAL HERNIA REPAIR     with gastric sleeve   LAPAROSCOPIC CHOLECYSTECTOMY SINGLE SITE WITH INTRAOPERATIVE CHOLANGIOGRAM N/A 06/14/2017   Procedure: LAPAROSCOPIC CHOLECYSTECTOMY SINGLE SITE;  Surgeon: Sheldon Standing, MD;  Location: [facility];  Service: General;  Laterality: N/A;   LAPAROSCOPIC GASTRIC SLEEVE RESECTION  09/2013   LASER ABLATION     Vascular   NASAL SINUS SURGERY  12/2021   TONSILLECTOMY  1988   WISDOM TOOTH EXTRACTION     Patient Active Problem List   Diagnosis Date Noted   Nephrolithiasis 12/29/2022   Ureteral stone with hydronephrosis 12/29/2022   Positive ANA (antinuclear antibody) 04/12/2022   Genetic testing 07/21/2021   Family history of melanoma 07/13/2021   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   Vertigo 12/23/2011   Otalgia of both ears 09/17/2010   SLEEP DISORDER/DISTURBANCE 09/27/2008   FATIGUE 07/15/2008   ADHD 12/05/2007   HEADACHE 11/26/2007   ALLERGIC RHINITIS 10/13/2007   NEPHROLITHIASIS, HX OF 10/13/2007   NONSPECIFIC MESENTERIC LYMPHADENITIS 03/17/2007   RENAL CALCULUS, LEFT 03/17/2007   DEPRESSION 03/07/2007   DYSPNEA ON EXERTION 03/07/2007    PCP: Charlett Apolinar POUR, MD  REFERRING PROVIDER: Chalice Saunas, MD  REFERRING DIAG: [phone] (ICD-10-CM) - Chronic  tension-type headache, intractable R42 (ICD-10-CM) - Vertigo R76.8 (ICD-10-CM) - Positive ANA (antinuclear antibody)  THERAPY DIAG:  Dizziness and giddiness  Unsteadiness on feet  ONSET DATE: 11/25/2023 (MD referral)  Rationale for Evaluation and Treatment: Rehabilitation  SUBJECTIVE:   SUBJECTIVE STATEMENT: I don't look forward to coming-it stirs it up.  I tend to overdo it when I'm up-can be up and out for 3 hours and then I pay for it tomorrow. No dizziness movement, but still feel imbalanced.  Planning to go to [employer] with my son after  therapy.   From Evaluation: Have had two plus years of neck pain and headaches and vertigo.  The vertigo has been extreme.  I've felt better since the addition of Mobic .  Vertigo is spinning-like the room is spinning, constant imbalance, lost security in my independence.  I'm basically bedbound.  Headaches have gone on for years/decades, but the vertigo accompanying is worse in the past 2 years. Pt reports she has been on medications for migraines, but they are not sure the headaches are migraine-related at this point.  She is to see headache specialist, Dr. Oneita, in the coming weeks. Pt accompanied by: self  PERTINENT HISTORY: ADD, anxiety, asthma, migraines, concussion x2, Lupus, shingles   PAIN: 12/14/23 Are you having pain? Yes: NPRS scale: HA: 5/10; dizziness: No/10; neck pain 6-7/10 Pain location: headache/pressure Pain description: pressure Aggravating factors: unsure Relieving factors: unsure  PRECAUTIONS: Other: Existing neck issue from years ago; she currently does not drive because of the vertigo  RED FLAGS: None   WEIGHT BEARING RESTRICTIONS: No  FALLS: Has patient fallen in last 6 months? Yes. Number of falls multiple  LIVING ENVIRONMENT: Lives with: lives with their family Lives in: House/apartment  PLOF: Independent and has been limited in previous several years-mostly bed bound, per pt report  PATIENT GOALS: To feel better, to walk more and to do more things my age  OBJECTIVE:    TODAY'S TREATMENT: 12/19/2023 Activity Comments  Reviewed HEP: Seated gaze stabilization horiz Seated gaze stabilization-vert Seated smooth pursuits -horizontal and vertical Supine head turns Supine neck retraction  4-5/10 moving after she stops Nausea and stops with vertical 2-3/10 symptoms (Feels movement upon stopping)  Cues to perform at 50% to lessen guarding  Gait 50 ft x 6 reps Good speed, rocking fwd/back upon ending-cues to look at steady target and she reports  this helps a little  NuStep, Level 3, 4 extremities x 6 minutes Pt reports no symptoms, feels good with Nustep motion (does not like the seat turning)             TREATMENT: 12/14/2023 Activity Comments  UBE L1; 2 min fwd, 2 min bwd seated  Manual therapy STM & TPR suboccipitals, cervical paraspinals, UTs Lateral glides grade II to III 21/24--low risk for falls  Supine  Cervical retraction 10x3 Cervical rotation iso 3x5 R&L Deep neck flexor training 3x5    Fatigues within 5 sec  Cawthorne cooksey exercises in supine: Eyes up/down x30 Head nods x10 Head turns x10     No increase in symptoms Very mild increase Increased dizziness by .5 points things are moving even when we stop  Supine VOR x 1 head turns; 2x30 Most dizzy with this, had to stop to readjust and decrease speed and cervical ROM with cueing  Supine smooth pursuit attempted   Seated smooth pursuit with plain background horizontal and vertical 2x30 Pt became too dizzy with the pattern of the ceiling  TREATMENT: 12/09/23 Activity Comments  105/81 mmHg, 103 bpm seated  Dynamic Gait Index 21/24--low risk for falls  Baseline symptoms 2/10   MSQ 24.9 = moderate  Cervical extension SNAG Reports increased dizziness  Seated nose to left knee 1x5    PATIENT EDUCATION: Education details: Continue current HEP, benefits of aerobic activity-NuStep, short bouts of walking; provided cues for smaller head motion with gaze stabilization and smaller motion for neck retraction Person educated: Patient Education method: Explanation and Demonstration Education comprehension: verbalized understanding and returned demonstration    Note: Objective measures were completed at Evaluation unless otherwise noted.  DIAGNOSTIC FINDINGS: 10/06/23 brain MRI: MRI scan of the brain with and without contrast showing only mild changes of chronic small vessel disease. Lumbar puncture WNL findings  COGNITION: Overall cognitive  status: Within functional limits for tasks assessed   SENSATION: Not tested  POSTURE:  rounded shoulders  Cervical ROM:    Active AROM (deg) eval  Flexion 36  Extension 25  Right lateral flexion   Left lateral flexion   Right rotation 55  Left rotation 55  (Blank rows = not tested)  GAIT: Gait pattern: guarded, slow pace, step through pattern, and wide BOS Distance walked: clinic distances   PATIENT SURVEYS:  DHI: 72  VESTIBULAR ASSESSMENT   GENERAL OBSERVATION: sits at the mat against the wall while providing history.  Things that make vertigo worse are closing eyes, sometimes turning to L, the shampoo bowl when I get my hair cut.  Has a lot of light sensitivity, noise sensitivity. Vision changes on a daily basis:  some days double vision; some days not.  Some days clarity of vision is not good; not consistent No hearing changes Nausea and vomiting occur-takes Zofran  Tries to keep wide BOS, try to avoid carrying things, stairs are hard. Baseline:  feels 2/10 symptoms as imbalance   OCULOMOTOR EXAM: Slight eye strain with all activities  increased dizziness with focus on the pen, increased nausea   Ocular ROM:  slowed motion overall, feels eyes trailing with horizontal   Spontaneous Nystagmus: absent   Gaze-Induced Nystagmus: absent   Smooth Pursuits: intact   Saccades: slight, small saccadic eye movements L eye with saccades to L and return to center   Convergence/Divergence: 12 cm    VESTIBULAR - OCULAR REFLEX:    Slow VOR: Comment: slowed, once stopped she continues to feel boat type motion 5/10, able to go down to 2-3/10, nausea   VOR Cancellation: Comment: increased symptoms, slowed   Head-Impulse Test: NT    Dynamic Visual Acuity: NT     POSITIONAL TESTING: Did not assess due to pt not complaining of spinning or increased symptoms specific to position changes       MOTION SENSITIVITY:  NOT TESTED AT EVAL    Motion Sensitivity Quotient  12/09/23  Intensity: 0 = none, 1 = Lightheaded, 2 = Mild, 3 = Moderate, 4 = Severe, 5 = Vomiting  Intensity  1. Sitting to supine 1--  2. Supine to L side 0  3. Supine to R side 1  4. Supine to sitting 0  5. L Hallpike-Dix 0--uncomfortable  6. Up from L Dix-Hallpike 1  7. R Hallpike-Dix 0  8. Up from R  2  9. Sitting, head  tipped to L knee 1  10. Head up from L  knee 3  11. Sitting, head  tipped to R knee 0  12. Head up from R  knee 0  13. Sitting head turns x5  3 (when stops motion)  14.Sitting head nods x5 2  15. In stance, 180  turn to L  4  16. In stance, 180  turn to R 3    All positions requiring > 30 sec for resolution   (10 x 51 raw score x 100)/2048= 24.9 = moderate                                                                                                                            PATIENT EDUCATION: Education details: Eval results, POC, rationale for habituation exercises to improve functional mobility, decrease dizziness Person educated: Patient Education method: Explanation Education comprehension: verbalized understanding  HOME EXERCISE PROGRAM: Access Code: [email or web address] URL: [email or web address] Date: 12/09/2023 Prepared by: Burnard Sandifer  Exercises - Mid-Lower Cervical Extension SNAG with Strap  - 1 x daily - 7 x weekly - 1 sets - 10 reps - 2-3 sec hold - Upper Cervical Extension SNAG with Strap  - 1 x daily - 7 x weekly - 1 sets - 10 reps - 2-3 sec hold - Seated Nose to Left Knee Vestibular Habituation  - 1 x daily - 7 x weekly - 5 reps   Access Code: [email or web address] URL: [email or web address] Date: 12/14/2023 Prepared by: Gellen April Earnie Starring  Exercises - Supine Cervical Flexion Extension on Pillow  - 1 x daily - 7 x weekly - 1 sets - 10 reps - Supine Cervical Rotation AROM on Pillow  - 1 x daily - 7 x weekly - 1 sets - 10 reps - Seated Gaze Stabilization with Head Nod  - 1 x daily - 7 x weekly - 2 sets - 20 sec  hold - Seated Gaze Stabilization with Head Rotation  - 1 x daily - 7 x weekly - 2 sets - 20 sec hold - Seated Horizontal Smooth Pursuit  - 1 x daily - 7 x weekly - 2 sets - 30 sec hold - Seated Vertical Smooth Pursuit  - 1 x daily - 7 x weekly - 2 sets - 30 sec hold  GOALS: Goals reviewed with patient? Yes  SHORT TERM GOALS: Target date: 12/30/2023  Patient to be independent with initial HEP. Baseline: HEP initiated Goal status: INITIAL    LONG TERM GOALS: Target date: 01/13/2024  Patient to be independent with advanced HEP. Baseline: Not yet initiated  Goal status: INITIAL  Patient to report 0/10 dizziness with standing vertical and horizontal VOR for 30 seconds. Baseline: Unable Goal status: INITIAL  Patient will report 0/10 dizziness with mobility tasks.  Baseline: Symptomatic  Goal status: INITIAL  Patient to demonstrate mod sway 30 sec, with M-CTSIB condition with eyes closed/foam surface in order to improve safety in environments with uneven surfaces and dim lighting. Baseline: unable Goal status: INITIAL  Patient to score at least 20/24 on DGI in order to decrease risk of falls. Baseline: 21/24 Goal status: MET  Patient to score at least 18 points less on DHI in order to  meet MCID and improve functional outcomes.  Baseline: 72 Goal status: INITIAL    ASSESSMENT:  CLINICAL IMPRESSION: Pt presents today and reports she has no dizziness, just imbalance upon starting session today. Skilled PT session focused on review of HEP and trying to add in some graded aerobic activity.  Pt continues to be symptomatic with VOR x 1 exercises, today more in vertical plane than horizontal; PT did provide cues for smaller range of motion while performing VOR.  Utilized NuStep for aerobic activity and she feels very good with this; however, with turning the seat and anytime we stop motion, she has increased rocking sensation (visible to PT that it is forward/back rocking).  Pt will  continue to benefit from skilled PT towards goals for improved functional mobility and decreased dizziness/imbalance.   OBJECTIVE IMPAIRMENTS: Abnormal gait, decreased balance, decreased mobility, dizziness, and pain.   ACTIVITY LIMITATIONS: standing, squatting, stairs, transfers, reach over head, locomotion level, and caring for others  PARTICIPATION LIMITATIONS: meal prep, cleaning, laundry, driving, shopping, and community activity  PERSONAL FACTORS: 3+ comorbidities: see PMH above are also affecting patient's functional outcome.   REHAB POTENTIAL: Good  CLINICAL DECISION MAKING: Evolving/moderate complexity  EVALUATION COMPLEXITY: Moderate   PLAN:  PT FREQUENCY: 1-2x/week  PT DURATION: 6 weeks including PT eval week  PLANNED INTERVENTIONS: 97750- Physical Performance Testing, 97110-Therapeutic exercises, 97530- Therapeutic activity, 97112- Neuromuscular re-education, 97535- Self Care, [id number]- Manual therapy, [phone]- Gait training, Patient/Family education, Balance training, Vestibular training, and DME instructions  PLAN FOR NEXT SESSION: Continue to try seated aerobic activity (like the Nustep) to promote improved activity level-monitor vitals.  Habituation exercises; ask how she did at [employer]; Check MCTSIB and try standing multi-sensory balance as able   STARLET GREIG ORN., PT, DPT 12/19/2023, 11:48 AM  [facility] at [facility] [address] [place], [address] [place], [place] [address] Phone # [phone] Fax # [phone]

## 2023-12-21 ENCOUNTER — Ambulatory Visit: Admitting: Physical Therapy

## 2023-12-21 NOTE — Therapy (Deleted)
 OUTPATIENT PHYSICAL THERAPY TREATMENT     Patient Name: Jordan Mayer MRN: [id number] DOB:June 19, 1970, 53 y.o., female Today's Date: 12/21/2023  END OF SESSION:      Past Medical History:  Diagnosis Date   ADD (attention deficit disorder)    Allergy    Anxiety    Asthma    Chicken pox    as child   Chronic migraines    Concussion    x 2 no residual   Depression    Family history of melanoma    Gallstones    GERD (gastroesophageal reflux disease)    Kidney stones    Lupus    PCOS (polycystic ovarian syndrome)    Pneumonia 2003   Renal disorder    kidney disease   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    Shingles    Past Surgical History:  Procedure Laterality Date   BREAST BIOPSY Left    2019   BUNIONECTOMY Right    CHOLECYSTECTOMY     COLONOSCOPY     CYSTOSCOPY W/ URETERAL STENT PLACEMENT Left 12/29/2022   Procedure: CYSTOSCOPY WITH LEFT RETROGRADE PYELOGRAM/URETERAL STENT PLACEMENT;  Surgeon: Gaston Hamilton, MD;  Location: [facility];  Service: Urology;  Laterality: Left;   CYSTOSCOPY/URETEROSCOPY/HOLMIUM LASER/STENT PLACEMENT Left 01/05/2023   Procedure: CYSTOSCOPY, LEFT URETEROSCOPY, HOLMIUM LASER LITHOTRIPSY, AND LEFT URETERAL STENT PLACEMENT;  Surgeon: Devere Lonni Righter, MD;  Location: [facility];  Service: Urology;  Laterality: Left;  45 MINUTES   DILATATION & CURETTAGE/HYSTEROSCOPY WITH MYOSURE N/A 06/15/2023   Procedure: DILATATION & CURETTAGE/HYSTEROSCOPY WITH MYOSURE;  Surgeon: Diedre Rosaline BRAVO, MD;  Location: [facility] [place];  Service: Gynecology;  Laterality: N/A;  instrument needed = myosure lite   HIATAL HERNIA REPAIR     with gastric sleeve   LAPAROSCOPIC CHOLECYSTECTOMY SINGLE SITE WITH INTRAOPERATIVE CHOLANGIOGRAM N/A 06/14/2017   Procedure: LAPAROSCOPIC CHOLECYSTECTOMY SINGLE SITE;  Surgeon: Sheldon Standing, MD;  Location: [facility];  Service: General;  Laterality: N/A;   LAPAROSCOPIC GASTRIC SLEEVE RESECTION  09/2013   LASER  ABLATION     Vascular   NASAL SINUS SURGERY  12/2021   TONSILLECTOMY  1988   WISDOM TOOTH EXTRACTION     Patient Active Problem List   Diagnosis Date Noted   Nephrolithiasis 12/29/2022   Ureteral stone with hydronephrosis 12/29/2022   Positive ANA (antinuclear antibody) 04/12/2022   Genetic testing 07/21/2021   Family history of melanoma 07/13/2021   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   Vertigo 12/23/2011   Otalgia of both ears 09/17/2010   SLEEP DISORDER/DISTURBANCE 09/27/2008   FATIGUE 07/15/2008   ADHD 12/05/2007   HEADACHE 11/26/2007   ALLERGIC RHINITIS 10/13/2007   NEPHROLITHIASIS, HX OF 10/13/2007   NONSPECIFIC MESENTERIC LYMPHADENITIS 03/17/2007   RENAL CALCULUS, LEFT 03/17/2007   DEPRESSION 03/07/2007   DYSPNEA ON EXERTION 03/07/2007    PCP: Charlett Apolinar POUR, MD  REFERRING PROVIDER: Dohmeier, Dedra, MD  REFERRING DIAG: G44.221 (ICD-10-CM) - Chronic tension-type headache, intractable R42 (ICD-10-CM) - Vertigo R76.8 (ICD-10-CM) - Positive ANA (antinuclear antibody)  THERAPY DIAG:  No diagnosis found.  ONSET DATE: 11/25/2023 (MD referral)  Rationale for Evaluation and Treatment: Rehabilitation  SUBJECTIVE:   SUBJECTIVE STATEMENT: *** I don't look forward to coming-it stirs it up.  I tend to overdo it when I'm up-can be up and out for 3 hours and then I pay for it tomorrow. No dizziness movement, but still feel imbalanced.  Planning to go to [employer] with my son after therapy.  From Evaluation: Have had two plus years of neck pain and headaches and vertigo.  The vertigo has been extreme.  I've felt better since the addition of Mobic .  Vertigo is spinning-like the room is spinning, constant imbalance, lost security in my independence.  I'm basically bedbound.  Headaches have gone on for years/decades, but the vertigo accompanying is worse in the past 2 years. Pt reports she has been on medications for  migraines, but they are not sure the headaches are migraine-related at this point.  She is to see headache specialist, Dr. Oneita, in the coming weeks. Pt accompanied by: self  PERTINENT HISTORY: ADD, anxiety, asthma, migraines, concussion x2, Lupus, shingles   PAIN: 12/14/23 Are you having pain? Yes: NPRS scale: HA: 5/10; dizziness: No/10; neck pain 6-7/10 Pain location: headache/pressure Pain description: pressure Aggravating factors: unsure Relieving factors: unsure  PRECAUTIONS: Other: Existing neck issue from years ago; she currently does not drive because of the vertigo  RED FLAGS: None   WEIGHT BEARING RESTRICTIONS: No  FALLS: Has patient fallen in last 6 months? Yes. Number of falls multiple  LIVING ENVIRONMENT: Lives with: lives with their family Lives in: House/apartment  PLOF: Independent and has been limited in previous several years-mostly bed bound, per pt report  PATIENT GOALS: To feel better, to walk more and to do more things my age  OBJECTIVE:  TODAY'S TREATMENT: 12/21/2023 Activity Comments  Reviewed HEP: ***Seated gaze stabilization horiz Seated gaze stabilization-vert Seated smooth pursuits -horizontal and vertical Supine head turns Supine neck retraction  4-5/10 moving after she stops Nausea and stops with vertical 2-3/10 symptoms (Feels movement upon stopping)  Cues to perform at 50% to lessen guarding  Gait 50 ft x 6 reps Good speed, rocking fwd/back upon ending-cues to look at steady target and she reports this helps a little  NuStep, Level 3, 4 extremities x 6 minutes Pt reports no symptoms, feels good with Nustep motion (does not like the seat turning)             TODAY'S TREATMENT: 12/19/2023 Activity Comments  Reviewed HEP: Seated gaze stabilization horiz Seated gaze stabilization-vert Seated smooth pursuits -horizontal and vertical Supine head turns Supine neck retraction  4-5/10 moving after she stops Nausea and stops with  vertical 2-3/10 symptoms (Feels movement upon stopping)  Cues to perform at 50% to lessen guarding  Gait 50 ft x 6 reps Good speed, rocking fwd/back upon ending-cues to look at steady target and she reports this helps a little  NuStep, Level 3, 4 extremities x 6 minutes Pt reports no symptoms, feels good with Nustep motion (does not like the seat turning)             TREATMENT: 12/14/2023 Activity Comments  UBE L1; 2 min fwd, 2 min bwd seated  Manual therapy STM & TPR suboccipitals, cervical paraspinals, UTs Lateral glides grade II to III 21/24--low risk for falls  Supine  Cervical retraction 10x3 Cervical rotation iso 3x5 R&L Deep neck flexor training 3x5    Fatigues within 5 sec  Cawthorne cooksey exercises in supine: Eyes up/down x30 Head nods x10 Head turns x10     No increase in symptoms Very mild increase Increased dizziness by .5 points things are moving even when we stop  Supine VOR x 1 head turns; 2x30 Most dizzy with this, had to stop to readjust and decrease speed and cervical ROM with cueing  Supine smooth pursuit attempted   Seated smooth pursuit with plain background horizontal and vertical  2x30 Pt became too dizzy with the pattern of the ceiling         PATIENT EDUCATION: Education details: Continue current HEP, benefits of aerobic activity-NuStep, short bouts of walking; provided cues for smaller head motion with gaze stabilization and smaller motion for neck retraction Person educated: Patient Education method: Explanation and Demonstration Education comprehension: verbalized understanding and returned demonstration    Note: Objective measures were completed at Evaluation unless otherwise noted.  DIAGNOSTIC FINDINGS: 10/06/23 brain MRI: MRI scan of the brain with and without contrast showing only mild changes of chronic small vessel disease. Lumbar puncture WNL findings  COGNITION: Overall cognitive status: Within functional limits for  tasks assessed   SENSATION: Not tested  POSTURE:  rounded shoulders  Cervical ROM:    Active AROM (deg) eval  Flexion 36  Extension 25  Right lateral flexion   Left lateral flexion   Right rotation 55  Left rotation 55  (Blank rows = not tested)  GAIT: Gait pattern: guarded, slow pace, step through pattern, and wide BOS Distance walked: clinic distances   PATIENT SURVEYS:  DHI: 72  VESTIBULAR ASSESSMENT   GENERAL OBSERVATION: sits at the mat against the wall while providing history.  Things that make vertigo worse are closing eyes, sometimes turning to L, the shampoo bowl when I get my hair cut.  Has a lot of light sensitivity, noise sensitivity. Vision changes on a daily basis:  some days double vision; some days not.  Some days clarity of vision is not good; not consistent No hearing changes Nausea and vomiting occur-takes Zofran  Tries to keep wide BOS, try to avoid carrying things, stairs are hard. Baseline:  feels 2/10 symptoms as imbalance   OCULOMOTOR EXAM: Slight eye strain with all activities  increased dizziness with focus on the pen, increased nausea   Ocular ROM:  slowed motion overall, feels eyes trailing with horizontal   Spontaneous Nystagmus: absent   Gaze-Induced Nystagmus: absent   Smooth Pursuits: intact   Saccades: slight, small saccadic eye movements L eye with saccades to L and return to center   Convergence/Divergence: 12 cm    VESTIBULAR - OCULAR REFLEX:    Slow VOR: Comment: slowed, once stopped she continues to feel boat type motion 5/10, able to go down to 2-3/10, nausea   VOR Cancellation: Comment: increased symptoms, slowed   Head-Impulse Test: NT    Dynamic Visual Acuity: NT     POSITIONAL TESTING: Did not assess due to pt not complaining of spinning or increased symptoms specific to position changes       MOTION SENSITIVITY:  NOT TESTED AT EVAL    Motion Sensitivity Quotient 12/09/23  Intensity: 0 = none, 1 = Lightheaded, 2  = Mild, 3 = Moderate, 4 = Severe, 5 = Vomiting  Intensity  1. Sitting to supine 1--  2. Supine to L side 0  3. Supine to R side 1  4. Supine to sitting 0  5. L Hallpike-Dix 0--uncomfortable  6. Up from L Dix-Hallpike 1  7. R Hallpike-Dix 0  8. Up from R  2  9. Sitting, head  tipped to L knee 1  10. Head up from L  knee 3  11. Sitting, head  tipped to R knee 0  12. Head up from R  knee 0  13. Sitting head turns x5 3 (when stops motion)  14.Sitting head nods x5 2  15. In stance, 180  turn to L  4  16. In stance, 180  turn to R 3    All positions requiring > 30 sec for resolution   (10 x 51 raw score x 100)/2048= 24.9 = moderate                                                                                                                            PATIENT EDUCATION: Education details: Eval results, POC, rationale for habituation exercises to improve functional mobility, decrease dizziness Person educated: Patient Education method: Explanation Education comprehension: verbalized understanding  HOME EXERCISE PROGRAM: Access Code: [email or web address] URL: [email or web address] Date: 12/09/2023 Prepared by: Burnard Sandifer  Exercises - Mid-Lower Cervical Extension SNAG with Strap  - 1 x daily - 7 x weekly - 1 sets - 10 reps - 2-3 sec hold - Upper Cervical Extension SNAG with Strap  - 1 x daily - 7 x weekly - 1 sets - 10 reps - 2-3 sec hold - Seated Nose to Left Knee Vestibular Habituation  - 1 x daily - 7 x weekly - 5 reps   Access Code: [email or web address] URL: [email or web address] Date: 12/14/2023 Prepared by: Ameyah Bangura April Earnie Starring  Exercises - Supine Cervical Flexion Extension on Pillow  - 1 x daily - 7 x weekly - 1 sets - 10 reps - Supine Cervical Rotation AROM on Pillow  - 1 x daily - 7 x weekly - 1 sets - 10 reps - Seated Gaze Stabilization with Head Nod  - 1 x daily - 7 x weekly - 2 sets - 20 sec hold - Seated Gaze Stabilization with Head Rotation   - 1 x daily - 7 x weekly - 2 sets - 20 sec hold - Seated Horizontal Smooth Pursuit  - 1 x daily - 7 x weekly - 2 sets - 30 sec hold - Seated Vertical Smooth Pursuit  - 1 x daily - 7 x weekly - 2 sets - 30 sec hold  GOALS: Goals reviewed with patient? Yes  SHORT TERM GOALS: Target date: 12/30/2023  Patient to be independent with initial HEP. Baseline: HEP initiated Goal status: INITIAL    LONG TERM GOALS: Target date: 01/13/2024  Patient to be independent with advanced HEP. Baseline: Not yet initiated  Goal status: INITIAL  Patient to report 0/10 dizziness with standing vertical and horizontal VOR for 30 seconds. Baseline: Unable Goal status: INITIAL  Patient will report 0/10 dizziness with mobility tasks.  Baseline: Symptomatic  Goal status: INITIAL  Patient to demonstrate mod sway 30 sec, with M-CTSIB condition with eyes closed/foam surface in order to improve safety in environments with uneven surfaces and dim lighting. Baseline: unable Goal status: INITIAL  Patient to score at least 20/24 on DGI in order to decrease risk of falls. Baseline: 21/24 Goal status: MET  Patient to score at least 18 points less on DHI in order to meet MCID and improve functional outcomes.  Baseline: 72 Goal status: INITIAL    ASSESSMENT:  CLINICAL IMPRESSION: *** Pt presents today and reports she has  no dizziness, just imbalance upon starting session today. Skilled PT session focused on review of HEP and trying to add in some graded aerobic activity.  Pt continues to be symptomatic with VOR x 1 exercises, today more in vertical plane than horizontal; PT did provide cues for smaller range of motion while performing VOR.  Utilized NuStep for aerobic activity and she feels very good with this; however, with turning the seat and anytime we stop motion, she has increased rocking sensation (visible to PT that it is forward/back rocking).  Pt will continue to benefit from skilled PT towards goals for  improved functional mobility and decreased dizziness/imbalance.   OBJECTIVE IMPAIRMENTS: Abnormal gait, decreased balance, decreased mobility, dizziness, and pain.   ACTIVITY LIMITATIONS: standing, squatting, stairs, transfers, reach over head, locomotion level, and caring for others  PARTICIPATION LIMITATIONS: meal prep, cleaning, laundry, driving, shopping, and community activity  PERSONAL FACTORS: 3+ comorbidities: see PMH above are also affecting patient's functional outcome.   REHAB POTENTIAL: Good  CLINICAL DECISION MAKING: Evolving/moderate complexity  EVALUATION COMPLEXITY: Moderate   PLAN:  PT FREQUENCY: 1-2x/week  PT DURATION: 6 weeks including PT eval week  PLANNED INTERVENTIONS: 97750- Physical Performance Testing, 97110-Therapeutic exercises, 97530- Therapeutic activity, 97112- Neuromuscular re-education, 97535- Self Care, [id number]- Manual therapy, [phone]- Gait training, Patient/Family education, Balance training, Vestibular training, and DME instructions  PLAN FOR NEXT SESSION: Continue to try seated aerobic activity (like the Nustep) to promote improved activity level-monitor vitals.  Habituation exercises; ask how she did at [employer]; Check MCTSIB and try standing multi-sensory balance as able   Sharicka Pogorzelski April Ma L Akhilesh Sassone, PT, DPT 12/21/2023, 8:45 AM  [facility] at [facility] [address] [place], [address] [place], [place] [address] Phone # [phone] Fax # [phone]

## 2023-12-22 NOTE — Telephone Encounter (Signed)
 Pharmacy Patient Advocate Encounter  Received notification from [facility] that Prior Authorization for Ubrelvy  100MG  tablets has been APPROVED from 12/19/2023 to 12/18/2024   PA #/Case ID/Reference #: PA Case ID #: [id number]

## 2023-12-27 ENCOUNTER — Ambulatory Visit: Admitting: Physical Therapy

## 2023-12-27 NOTE — Therapy (Signed)
 Pt request to speak to PT prior to signing in fully. Pt reports flare up with her lupus and increased dizziness this morning. States just sitting and laying down she is feeling dizzy and that the room is moving. Reports that turning and moving she feels nauseous and close to vomiting. Discussed with pt to hold on PT for today and for her to just practice eyes only exercises at home (I.e. keeping image/target in one place and smooth pursuit/saccadic movements). Pt verbalizes understanding and will see how she feels prior to her next treatment session on Thursday.   Airik Goodlin April Ma L Donyea Gafford, PT, DPT

## 2023-12-29 ENCOUNTER — Ambulatory Visit: Admitting: Physical Therapy

## 2024-01-05 ENCOUNTER — Ambulatory Visit: Payer: Self-pay | Admitting: Neurology

## 2024-01-17 ENCOUNTER — Encounter (HOSPITAL_BASED_OUTPATIENT_CLINIC_OR_DEPARTMENT_OTHER): Payer: Self-pay

## 2024-01-18 ENCOUNTER — Encounter: Payer: Self-pay | Admitting: Cardiology

## 2024-01-18 ENCOUNTER — Ambulatory Visit: Attending: Cardiology | Admitting: Cardiology

## 2024-01-18 VITALS — BP 117/81 | HR 89 | Resp 16 | Ht 65.0 in | Wt 170.2 lb

## 2024-01-18 DIAGNOSIS — R5382 Chronic fatigue, unspecified: Secondary | ICD-10-CM

## 2024-01-18 DIAGNOSIS — I959 Hypotension, unspecified: Secondary | ICD-10-CM

## 2024-01-18 DIAGNOSIS — I7300 Raynaud's syndrome without gangrene: Secondary | ICD-10-CM | POA: Diagnosis not present

## 2024-01-18 DIAGNOSIS — M329 Systemic lupus erythematosus, unspecified: Secondary | ICD-10-CM

## 2024-01-18 MED ORDER — SPIRONOLACTONE 50 MG PO TABS: 50.0000 mg | ORAL_TABLET | Freq: Every day | ORAL | 3 refills | Status: AC

## 2024-01-18 NOTE — Progress Notes (Signed)
 Cardiology Office Note:    Date:  01/18/2024  NAME:  Jordan Mayer    MRN: [id number] DOB:  09/09/1970   PCP:  Charlett Apolinar POUR, MD  Former Cardiology Providers: None Primary Cardiologist:  Madonna Large, DO, [facility] (established care 01/18/2024) Electrophysiologist:  None   Referring MD: Rayburn Pac, MD  Reason of Consult: Hypotension  Chief Complaint  Patient presents with   Hypotension   New Patient (Initial Visit)    History of Present Illness:    Jordan Mayer is a 53 y.o. Caucasian female whose past medical history and cardiovascular risk factors includes: History of PCOS, Raynaud's syndrome, lupus, chronic fatigue syndrome, anxiety, ADD/ADHD. She is being seen today for the evaluation of hypotension at the request of Rayburn Pac, MD.  She has a history of consistently low blood pressure, which has been present for as long as she can remember. Her siblings also experience low blood pressure. She has not had a prior work-up for this issue. She describes difficulty in obtaining blood pressure readings, both at home and in medical settings, with automatic cuffs often showing errors and manual cuffs failing to detect her blood pressure. She experiences symptoms of lightheadedness and dizziness, though she has not fainted.  She has a history of headaches since her teenage years, which have now become daily occurrences. She has completed vestibular therapy with slight improvement, allowing her to drive for the first time in over a year.  She experiences significant fatigue, sometimes sleeping up to 20 hours a day, which she attributes to her autoimmune conditions, lupus and chronic fatigue syndrome.  Her menstrual history is irregular due to polycystic ovary syndrome (PCOS), and she is currently on spironolactone  100 mg once daily for her skin and PCOS. She is also on Plaquenil , Mobic , and Zofran , and uses prednisone  during lupus flares. She previously tried trazodone  but  discontinued it due to feeling faint.  Current Medications: Current Meds  Medication Sig   ALPRAZolam  (XANAX ) 0.25 MG tablet Take 2 tablets (0.5 mg total) by mouth daily as needed (dizziness/vertigo).   Cholecalciferol (D3 PO) Take by mouth. 2000 mg daily   EPINEPHrine  0.3 mg/0.3 mL IJ SOAJ injection    Galcanezumab -gnlm (EMGALITY ) 120 MG/ML SOAJ Inject 120 mg into the skin every 30 (thirty) days.   hydroxychloroquine  (PLAQUENIL ) 200 MG tablet Take 200mg  by mouth twice daily, Monday through Friday only. None on Saturday or Sunday.   meloxicam  (MOBIC ) 15 MG tablet Take 0.5 tablets (7.5 mg total) by mouth daily.   Multiple Vitamin (MULTIVITAMIN) tablet Take 1 tablet by mouth daily.   ondansetron  (ZOFRAN -ODT) 8 MG disintegrating tablet Take 1 tablet (8 mg total) by mouth 2 (two) times daily.   predniSONE  (DELTASONE ) 10 MG tablet Take 30 mg in AM after breakfast po for 7 days, followed by 20 mg for 7 days and followed by 10 mg for 14 days.   progesterone (PROMETRIUM) 200 MG capsule Take 200 mg by mouth at bedtime.   traZODone  (DESYREL ) 100 MG tablet Take 2 tablets (200 mg total) by mouth at bedtime.   venlafaxine  (EFFEXOR ) 37.5 MG tablet Take 1 tablet (37.5 mg total) by mouth 2 (two) times daily.   [DISCONTINUED] spironolactone  (ALDACTONE ) 100 MG tablet Take 100 mg by mouth daily.     Allergies:    Contrast media [iodinated contrast media], Gadolinium derivatives, Iohexol , and Penicillins   Past Medical History: Past Medical History:  Diagnosis Date   ADD (attention deficit disorder)    Allergy  Anxiety    Asthma    Chicken pox    as child   Chronic migraines    Concussion    x 2 no residual   Depression    Family history of melanoma    Gallstones    GERD (gastroesophageal reflux disease)    Kidney stones    Lupus    PCOS (polycystic ovarian syndrome)    Pneumonia 2003   Renal disorder    kidney disease   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    Shingles     Past Surgical  History: Past Surgical History:  Procedure Laterality Date   BREAST BIOPSY Left    2019   BUNIONECTOMY Right    CHOLECYSTECTOMY     COLONOSCOPY     CYSTOSCOPY W/ URETERAL STENT PLACEMENT Left 12/29/2022   Procedure: CYSTOSCOPY WITH LEFT RETROGRADE PYELOGRAM/URETERAL STENT PLACEMENT;  Surgeon: Gaston Hamilton, MD;  Location: [facility];  Service: Urology;  Laterality: Left;   CYSTOSCOPY/URETEROSCOPY/HOLMIUM LASER/STENT PLACEMENT Left 01/05/2023   Procedure: CYSTOSCOPY, LEFT URETEROSCOPY, HOLMIUM LASER LITHOTRIPSY, AND LEFT URETERAL STENT PLACEMENT;  Surgeon: Devere Lonni Righter, MD;  Location: [facility];  Service: Urology;  Laterality: Left;  45 MINUTES   DILATATION & CURETTAGE/HYSTEROSCOPY WITH MYOSURE N/A 06/15/2023   Procedure: DILATATION & CURETTAGE/HYSTEROSCOPY WITH MYOSURE;  Surgeon: Diedre Rosaline BRAVO, MD;  Location: [facility];  Service: Gynecology;  Laterality: N/A;  instrument needed = myosure lite   HIATAL HERNIA REPAIR     with gastric sleeve   LAPAROSCOPIC CHOLECYSTECTOMY SINGLE SITE WITH INTRAOPERATIVE CHOLANGIOGRAM N/A 06/14/2017   Procedure: LAPAROSCOPIC CHOLECYSTECTOMY SINGLE SITE;  Surgeon: Sheldon Standing, MD;  Location: [facility];  Service: General;  Laterality: N/A;   LAPAROSCOPIC GASTRIC SLEEVE RESECTION  09/2013   LASER ABLATION     Vascular   NASAL SINUS SURGERY  12/2021   TONSILLECTOMY  1988   WISDOM TOOTH EXTRACTION      Social History: Social History   Tobacco Use   Smoking status: Never    Passive exposure: Never   Smokeless tobacco: Never  Vaping Use   Vaping status: Never Used  Substance Use Topics   Alcohol use: Not Currently   Drug use: No    Family History: Family History  Problem Relation Age of Onset   Varicose Veins Mother    Asthma Mother    Myasthenia gravis Mother        Ocular   Varicose Veins Sister    Arthritis Maternal Grandmother    Arthritis Paternal Grandfather    Hearing loss Paternal  Grandfather    Healthy Son    Healthy Son    Birth defects Paternal Aunt        Cognitively Challenged   Miscarriages / Stillbirths Paternal Aunt    Varicose Veins Paternal Aunt    Varicose Veins Paternal Aunt    Melanoma Paternal Aunt        melanoma x2   Prostate cancer Cousin    Colon cancer Neg Hx    Colon polyps Neg Hx    Esophageal cancer Neg Hx    Rectal cancer Neg Hx    Stomach cancer Neg Hx     ROS:   Review of Systems  Cardiovascular:  Negative for chest pain, claudication, irregular heartbeat, leg swelling, orthopnea, palpitations, paroxysmal nocturnal dyspnea and syncope.  Respiratory:  Negative for shortness of breath.   Hematologic/Lymphatic: Negative for bleeding problem.  Neurological:  Positive for dizziness (chronic, stable), light-headedness (chronic, stable) and vertigo.  EKGs/Labs/Other Studies Reviewed:   EKG: EKG Interpretation Date/Time:  Wednesday January 18 2024 09:47:08 EDT Ventricular Rate:  89 PR Interval:  146 QRS Duration:  82 QT Interval:  356 QTC Calculation: 433 R Axis:   81  Text Interpretation: Normal sinus rhythm Normal ECG When compared with ECG of 24-Jun-2023 15:53, Nonspecific T wave abnormality no longer evident in Inferior leads Nonspecific T wave abnormality no longer evident in Anterolateral leads Confirmed by Michele Richardson [phone]) on 01/18/2024 10:05:34 AM  Echocardiogram: None  Labs:    Latest Ref Rng & Units 06/24/2023    3:49 PM 06/15/2023   10:50 AM 01/25/2023    4:22 PM  CBC  WBC 4.0 - 10.5 K/uL 6.1  5.6  5.5   Hemoglobin 12.0 - 15.0 g/dL 85.4  86.1  87.1   Hematocrit 36.0 - 46.0 % 42.5  41.5  39.4   Platelets 150 - 400 K/uL 267  239  250.0        Latest Ref Rng & Units 06/24/2023    3:49 PM 01/25/2023    4:22 PM 12/30/2022    4:52 AM  BMP  Glucose 70 - 99 mg/dL 92  90  89   BUN 6 - 20 mg/dL 14  12  8    Creatinine 0.44 - 1.00 mg/dL 8.80  9.02  8.93   Sodium 135 - 145 mmol/L 138  140  138   Potassium 3.5 - 5.1  mmol/L 3.8  3.8  3.7   Chloride 98 - 111 mmol/L 104  110  109   CO2 22 - 32 mmol/L 22  24  24    Calcium 8.9 - 10.3 mg/dL 9.8  8.9  8.4       Latest Ref Rng & Units 06/24/2023    3:49 PM 01/25/2023    4:22 PM 12/30/2022    4:52 AM  CMP  Glucose 70 - 99 mg/dL 92  90  89   BUN 6 - 20 mg/dL 14  12  8    Creatinine 0.44 - 1.00 mg/dL 8.80  9.02  8.93   Sodium 135 - 145 mmol/L 138  140  138   Potassium 3.5 - 5.1 mmol/L 3.8  3.8  3.7   Chloride 98 - 111 mmol/L 104  110  109   CO2 22 - 32 mmol/L 22  24  24    Calcium 8.9 - 10.3 mg/dL 9.8  8.9  8.4   Total Protein 6.0 - 8.3 g/dL  6.5    Total Bilirubin 0.2 - 1.2 mg/dL  0.4    Alkaline Phos 39 - 117 U/L  54    AST 0 - 37 U/L  17    ALT 0 - 35 U/L  19      Lab Results  Component Value Date   CHOL 187 06/12/2020   HDL 46.60 06/12/2020   LDLCALC 102 (H) 06/12/2020   TRIG 115 06/02/2021   CHOLHDL 4 06/12/2020   No results for input(s): LIPOA in the last 8760 hours. No components found for: NTPROBNP No results for input(s): PROBNP in the last 8760 hours. No results for input(s): TSH in the last 8760 hours.  Physical Exam:    Today's Vitals   01/18/24 0943  BP: 117/81  Pulse: 89  Resp: 16  SpO2: 98%  Weight: 170 lb 3.2 oz (77.2 kg)  Height: 5' 5 (1.651 m)   Body mass index is 28.32 kg/m. Wt Readings from Last 3 Encounters:  01/18/24 170 lb 3.2  oz (77.2 kg)  09/20/23 164 lb 3.2 oz (74.5 kg)  06/15/23 182 lb 4.8 oz (82.7 kg)   Orthostatic VS for the past 72 hrs (Last 3 readings):  Orthostatic BP Patient Position BP Location Cuff Size Orthostatic Pulse  01/18/24 0949 115/83 Standing Left Arm Normal 103  01/18/24 0948 105/73 Sitting Left Arm Normal 89  01/18/24 0945 98/73 Supine Left Arm Normal 89      Physical Exam  Constitutional: No distress.  hemodynamically stable  Neck: No JVD present.  Cardiovascular: Normal rate, regular rhythm, S1 normal and S2 normal. Exam reveals no gallop, no S3 and no S4.  No murmur  heard. Pulmonary/Chest: Effort normal and breath sounds normal. No stridor. She has no wheezes. She has no rales.  Musculoskeletal:        General: No edema.     Cervical back: Neck supple.  Skin: Skin is warm.     Impression & Recommendation(s):  Impression:   ICD-10-CM   1. Hypotension, unspecified hypotension type  I95.9 EKG 12-Lead    ECHOCARDIOGRAM COMPLETE    2. SLE (systemic lupus erythematosus related syndrome) (HCC)  M32.9     3. Raynaud's disease without gangrene  I73.00     4. Chronic fatigue  R53.82        Recommendation(s):  Hypotension, unspecified hypotension type I suspect her hypotension is likely precipitated by high doses of spironolactone . In the past she was on spironolactone  200 mg p.o. daily. Currently she take spironolactone  100 mg p.o. daily. When asked patient states that the spironolactone  is for her PCOS and skin. Encouraged reaching out to her GYN to reduce the dose of spironolactone , patient states that her GYN would be okay reducing it if cardiology felt it is needed. Recommended reducing spironolactone  to 50 mg p.o. daily and to monitor symptoms. Orthostatic vital signs negative. Increase water  intake. Re emphasized the importance of 3 heart healthy balanced meals. Encouraged increasing physical activity with a goal of 30 minutes of moderate intensity exercise 5 days a week as tolerated, as simple as walking Vestibular therapy has helped. Change positions slowly. Avoid extreme hot waters if possible. Increase salt and days when she is more symptomatic Change positions slowly Seek medical attention if she has a syncopal event. Echo will be ordered to evaluate for structural heart disease and left ventricular systolic function. As long as the echocardiogram is unremarkable no additional workup is warranted at this time and I will see her on appearing basis.  SLE (systemic lupus erythematosus related syndrome) (HCC) Raynaud's disease without  gangrene Chronic fatigue Follows rheumatology.   Orders Placed:  Orders Placed This Encounter  Procedures   EKG 12-Lead   ECHOCARDIOGRAM COMPLETE    Standing Status:   Future    Expiration Date:   01/17/2025    Where [facility] performed[facility]    Does the patient weigh less than or greater than 250 lbs?:   Patient weighs less than 250 lbs    Perflutren DEFINITY (image enhancing agent) should be administered unless hypersensitivity or allergy exist:   Administer Perflutren    Reason for exam-Echo:   Other-Full Diagnosis List    Full ICD-10/Reason for Exam:   Orthostatic hypotension [458.0.ICD-9-CM]     Final Medication List:    Meds ordered this encounter  Medications   spironolactone  (ALDACTONE ) 50 MG tablet    Sig: Take 1 tablet (50 mg total) by mouth daily.    Dispense:  90  tablet    Refill:  3    Medications Discontinued During This Encounter  Medication Reason   UBRELVY  100 MG TABS Patient Preference   acetaminophen  (TYLENOL ) 500 MG tablet No longer needed (for PRN medications)   diphenhydrAMINE  (BENADRYL ) 50 MG tablet Patient Preference   Semaglutide-Weight Management (WEGOVY) 1.7 MG/0.75ML SOAJ Patient Preference   spironolactone  (ALDACTONE ) 100 MG tablet Reorder     Current Outpatient Medications:    ALPRAZolam  (XANAX ) 0.25 MG tablet, Take 2 tablets (0.5 mg total) by mouth daily as needed (dizziness/vertigo)., Disp: 30 tablet, Rfl: 0   Cholecalciferol (D3 PO), Take by mouth. 2000 mg daily, Disp: , Rfl:    EPINEPHrine  0.3 mg/0.3 mL IJ SOAJ injection, , Disp: , Rfl:    Galcanezumab -gnlm (EMGALITY ) 120 MG/ML SOAJ, Inject 120 mg into the skin every 30 (thirty) days., Disp: 1 mL, Rfl: 11   hydroxychloroquine  (PLAQUENIL ) 200 MG tablet, Take 200mg  by mouth twice daily, Monday through Friday only. None on Saturday or Sunday., Disp: 120 tablet, Rfl: 0   meloxicam  (MOBIC ) 15 MG tablet, Take 0.5 tablets (7.5 mg total) by mouth daily., Disp: 30 tablet,  Rfl: 1   Multiple Vitamin (MULTIVITAMIN) tablet, Take 1 tablet by mouth daily., Disp: , Rfl:    ondansetron  (ZOFRAN -ODT) 8 MG disintegrating tablet, Take 1 tablet (8 mg total) by mouth 2 (two) times daily., Disp: 60 tablet, Rfl: 1   predniSONE  (DELTASONE ) 10 MG tablet, Take 30 mg in AM after breakfast po for 7 days, followed by 20 mg for 7 days and followed by 10 mg for 14 days., Disp: 50 tablet, Rfl: 1   progesterone (PROMETRIUM) 200 MG capsule, Take 200 mg by mouth at bedtime., Disp: , Rfl:    traZODone  (DESYREL ) 100 MG tablet, Take 2 tablets (200 mg total) by mouth at bedtime., Disp: , Rfl:    venlafaxine  (EFFEXOR ) 37.5 MG tablet, Take 1 tablet (37.5 mg total) by mouth 2 (two) times daily., Disp: , Rfl:    spironolactone  (ALDACTONE ) 50 MG tablet, Take 1 tablet (50 mg total) by mouth daily., Disp: 90 tablet, Rfl: 3  Consent:   N/A  Disposition:   As needed Patient may be asked to follow-up sooner based on the results of the above-mentioned testing.  Her questions and concerns were addressed to her satisfaction. She voices understanding of the recommendations provided during this encounter.    Signed, Madonna Michele HAS, [facility]  A Division of [facility] [address] [place], [place] [address]  01/18/2024 7:29 PM

## 2024-01-18 NOTE — Patient Instructions (Addendum)
 Medication Instructions:   DECREASE SPIRONOLACTONE  TO 50 MG ONCE DAILY= 1/2 OF THE 100 MG TABLET ONCE DAILY  *If you need a refill on your cardiac medications before your next appointment, please call your pharmacy*  Testing/Procedures:  Your physician has requested that you have an echocardiogram. Echocardiography is a painless test that uses sound waves to create images of your heart. It provides your doctor with information about the size and shape of your heart and how well your heart's chambers and valves are working. This procedure takes approximately one hour. There are no restrictions for this procedure. Please do NOT wear cologne, perfume, aftershave, or lotions (deodorant is allowed). Please arrive 15 minutes prior to your appointment time.  Please note: We ask at that you not bring children with you during ultrasound (echo/ vascular) testing. Due to room size and safety concerns, children are not allowed in the ultrasound rooms during exams. Our front office staff cannot provide observation of children in our lobby area while testing is being conducted. An adult accompanying a patient to their appointment will only be allowed in the ultrasound room at the discretion of the ultrasound technician under special circumstances. We apologize for any inconvenience. [address]  Follow-Up: At [facility], you and your health needs are our priority.  As part of our continuing mission to provide you with exceptional heart care, our providers are all part of one team.  This team includes your primary Cardiologist (physician) and Advanced Practice Providers or APPs (Physician Assistants and Nurse Practitioners) who all work together to provide you with the care you need, when you need it.  Your next appointment:    AS NEEDED  Other Instructions  INCREASE HYDRATION AVOIDING CAFFEINE

## 2024-01-26 ENCOUNTER — Telehealth: Payer: Self-pay

## 2024-01-26 MED ORDER — COVID-19 MRNA VAC-TRIS(PFIZER) 30 MCG/0.3ML IM SUSY: 0.3000 mL | PREFILLED_SYRINGE | Freq: Once | INTRAMUSCULAR | 0 refills | Status: DC

## 2024-01-26 MED ORDER — COVID-19 MRNA VAC-TRIS(PFIZER) 30 MCG/0.3ML IM SUSY: 0.3000 mL | PREFILLED_SYRINGE | Freq: Once | INTRAMUSCULAR | 0 refills | Status: AC

## 2024-01-26 NOTE — Addendum Note (Signed)
 Addended by: Cloteal Isaacson on: 01/26/2024 02:54 PM   Modules accepted: Orders

## 2024-01-26 NOTE — Telephone Encounter (Signed)
 Copied from CRM #[id number]. Topic: Clinical - Prescription Issue >> Jan 25, 2024  9:37 AM Berneda FALCON wrote: Reason for CRM: Pt states she has an appt at 10:50 this morning at the [facility] on [address] [facility] [address] [facility] [address] [facility] for a covid vaccine and needs an order placed for this if possible please.  Patient callback is [phone]

## 2024-01-26 NOTE — Telephone Encounter (Signed)
 Contacted pt. Left detail message that Rx sent to requested pharmacy.

## 2024-01-26 NOTE — Addendum Note (Signed)
 Addended by: Marquitta Persichetti on: 01/26/2024 02:56 PM   Modules accepted: Orders

## 2024-02-03 ENCOUNTER — Other Ambulatory Visit (HOSPITAL_COMMUNITY): Payer: Self-pay

## 2024-02-20 ENCOUNTER — Telehealth (HOSPITAL_COMMUNITY): Payer: Self-pay | Admitting: Cardiology

## 2024-02-20 NOTE — Telephone Encounter (Signed)
 Patient called and cancelled echocardiogram. Order will be removed from the echo WQ. If patient calls back to rescheduled we will reinstate the order.   02/20/2024 7:54 AM Ab:TZORY, Daune Colgate B  Cancel Rsn: Patient (PT LVM TO CANCEL 02/18/24 10:31 am (Saturday))

## 2024-02-22 ENCOUNTER — Ambulatory Visit (HOSPITAL_COMMUNITY)

## 2024-03-10 ENCOUNTER — Ambulatory Visit (HOSPITAL_COMMUNITY)
Admission: EM | Admit: 2024-03-10 | Discharge: 2024-03-10 | Disposition: A | Discharge status: Home / Self Care | Attending: Family Medicine | Admitting: Family Medicine

## 2024-03-10 ENCOUNTER — Encounter (HOSPITAL_COMMUNITY): Payer: Self-pay

## 2024-03-10 DIAGNOSIS — M79604 Pain in right leg: Secondary | ICD-10-CM | POA: Diagnosis not present

## 2024-03-10 NOTE — ED Provider Notes (Signed)
 [facility]    CSN: [id number] Arrival date & time: 03/10/24  1717      History   Chief Complaint Chief Complaint  Patient presents with   Leg Pain    HPI Jordan Mayer is a 53 y.o. female.    Leg Pain  Here for pain in her right lower leg.  It began 2 hours prior to arrival.  She had pain started in her right lower leg just medial to the right shin below the knee.  Now it is bothering her more closer to the calf muscle.  It is throbbing but not intense pain.  No swelling noted.  No fever or chills  She has a history of lupus and states that she is spends a lot of time lying down and in bed because of her medical conditions and how she feels.  She is very concerned that she has a DVT.  She is allergic to penicillin and gadolinium and iodinated contrast.  She mentions that she does have a little shortness of breath but this is not unusual for her but this episode did just start today. Past Medical History:  Diagnosis Date   ADD (attention deficit disorder)    Allergy    Anxiety    Asthma    Chicken pox    as child   Chronic migraines    Concussion    x 2 no residual   Depression    Family history of melanoma    Gallstones    GERD (gastroesophageal reflux disease)    Kidney stones    Lupus    PCOS (polycystic ovarian syndrome)    Pneumonia 2003   Renal disorder    kidney disease   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    Shingles     Patient Active Problem List   Diagnosis Date Noted   Nephrolithiasis 12/29/2022   Ureteral stone with hydronephrosis 12/29/2022   Positive ANA (antinuclear antibody) 04/12/2022   Genetic testing 07/21/2021   Family history of melanoma 07/13/2021   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   Vertigo 12/23/2011   Otalgia of both ears 09/17/2010   SLEEP DISORDER/DISTURBANCE 09/27/2008   FATIGUE 07/15/2008   ADHD 12/05/2007   HEADACHE 11/26/2007   ALLERGIC RHINITIS 10/13/2007    NEPHROLITHIASIS, HX OF 10/13/2007   NONSPECIFIC MESENTERIC LYMPHADENITIS 03/17/2007   RENAL CALCULUS, LEFT 03/17/2007   DEPRESSION 03/07/2007   DYSPNEA ON EXERTION 03/07/2007    Past Surgical History:  Procedure Laterality Date   BREAST BIOPSY Left    2019   BUNIONECTOMY Right    CHOLECYSTECTOMY     COLONOSCOPY     CYSTOSCOPY W/ URETERAL STENT PLACEMENT Left 12/29/2022   Procedure: CYSTOSCOPY WITH LEFT RETROGRADE PYELOGRAM/URETERAL STENT PLACEMENT;  Surgeon: Gaston Hamilton, MD;  Location: [facility];  Service: Urology;  Laterality: Left;   CYSTOSCOPY/URETEROSCOPY/HOLMIUM LASER/STENT PLACEMENT Left 01/05/2023   Procedure: CYSTOSCOPY, LEFT URETEROSCOPY, HOLMIUM LASER LITHOTRIPSY, AND LEFT URETERAL STENT PLACEMENT;  Surgeon: Devere Lonni Righter, MD;  Location: [facility];  Service: Urology;  Laterality: Left;  45 MINUTES   DILATATION & CURETTAGE/HYSTEROSCOPY WITH MYOSURE N/A 06/15/2023   Procedure: DILATATION & CURETTAGE/HYSTEROSCOPY WITH MYOSURE;  Surgeon: Diedre Rosaline BRAVO, MD;  Location: [facility];  Service: Gynecology;  Laterality: N/A;  instrument needed = myosure lite   HIATAL HERNIA REPAIR     with gastric sleeve   LAPAROSCOPIC CHOLECYSTECTOMY SINGLE SITE WITH INTRAOPERATIVE CHOLANGIOGRAM N/A 06/14/2017   Procedure: LAPAROSCOPIC CHOLECYSTECTOMY  SINGLE SITE;  Surgeon: Sheldon Standing, MD;  Location: [facility];  Service: General;  Laterality: N/A;   LAPAROSCOPIC GASTRIC SLEEVE RESECTION  09/2013   LASER ABLATION     Vascular   NASAL SINUS SURGERY  12/2021   TONSILLECTOMY  1988   WISDOM TOOTH EXTRACTION      OB History     Gravida  2   Para  2   Term  1   Preterm  1   AB      Living         SAB      IAB      Ectopic      Multiple      Live Births               Home Medications    Prior to Admission medications   Medication Sig Start Date End Date Taking? Authorizing Provider  Cholecalciferol (D3 PO) Take by  mouth. 2000 mg daily   Yes [provider]  EPINEPHrine  0.3 mg/0.3 mL IJ SOAJ injection  12/27/17  Yes [provider]  hydroxychloroquine  (PLAQUENIL ) 200 MG tablet Take 200mg  by mouth twice daily, Monday through Friday only. None on Saturday or Sunday. 08/18/22  Yes Cheryl Waddell HERO, PA-C  Multiple Vitamin (MULTIVITAMIN) tablet Take 1 tablet by mouth daily.   Yes [provider]  nitrofurantoin , macrocrystal-monohydrate, (MACROBID ) 100 MG capsule Take 100 mg by mouth every 12 (twelve) hours. 03/06/24  Yes [provider]  ondansetron  (ZOFRAN -ODT) 8 MG disintegrating tablet Take 1 tablet (8 mg total) by mouth 2 (two) times daily. 09/20/23  Yes Dohmeier, Dedra, MD  progesterone (PROMETRIUM) 200 MG capsule Take 200 mg by mouth at bedtime. 05/02/23  Yes [provider]  spironolactone  (ALDACTONE ) 50 MG tablet Take 1 tablet (50 mg total) by mouth daily. 01/18/24  Yes Tolia, Sunit, DO  traZODone  (DESYREL ) 100 MG tablet Take 2 tablets (200 mg total) by mouth at bedtime. 12/30/22  Yes Delia Ole ORN, NP  venlafaxine  (EFFEXOR ) 37.5 MG tablet Take 1 tablet (37.5 mg total) by mouth 2 (two) times daily. 12/30/22  Yes Sattenfield, Ole ORN, NP    Family History Family History  Problem Relation Age of Onset   Varicose Veins Mother    Asthma Mother    Myasthenia gravis Mother        Ocular   Varicose Veins Sister    Arthritis Maternal Grandmother    Arthritis Paternal Grandfather    Hearing loss Paternal Grandfather    Healthy Son    Healthy Son    Birth defects Paternal Aunt        Cognitively Challenged   Miscarriages / Stillbirths Paternal Aunt    Varicose Veins Paternal Aunt    Varicose Veins Paternal Aunt    Melanoma Paternal Aunt        melanoma x2   Prostate cancer Cousin    Colon cancer Neg Hx    Colon polyps Neg Hx    Esophageal cancer Neg Hx    Rectal cancer Neg Hx    Stomach cancer Neg Hx     Social History Social History   Tobacco  Use   Smoking status: Never    Passive exposure: Never   Smokeless tobacco: Never  Vaping Use   Vaping status: Never Used  Substance Use Topics   Alcohol use: Not Currently   Drug use: No     Allergies   Contrast media [iodinated contrast media], Gadolinium derivatives, Iohexol ,  and Penicillins   Review of Systems Review of Systems   Physical Exam Triage Vital Signs ED Triage Vitals  Encounter Vitals Group     BP 03/10/24 1753 100/69     Girls Systolic BP Percentile --      Girls Diastolic BP Percentile --      Boys Systolic BP Percentile --      Boys Diastolic BP Percentile --      Pulse Rate 03/10/24 1753 90     Resp 03/10/24 1753 18     Temp 03/10/24 1753 98.1 F (36.7 C)     Temp Source 03/10/24 1753 Oral     SpO2 03/10/24 1753 97 %     Weight 03/10/24 1753 165 lb (74.8 kg)     Height 03/10/24 1753 5' 5 (1.651 m)     Head Circumference --      Peak Flow --      Pain Score 03/10/24 1750 5     Pain Loc --      Pain Education --      Exclude from Growth Chart --    No data found.  Updated Vital Signs BP 100/69 (BP Location: Left Arm)   Pulse 90   Temp 98.1 F (36.7 C) (Oral)   Resp 18   Ht 5' 5 (1.651 m)   Wt 74.8 kg   LMP 02/25/2024 (Approximate)   SpO2 97%   BMI 27.46 kg/m   Visual Acuity Right Eye Distance:   Left Eye Distance:   Bilateral Distance:    Right Eye Near:   Left Eye Near:    Bilateral Near:     Physical Exam Vitals reviewed.  Constitutional:      General: She is not in acute distress.    Appearance: She is not ill-appearing, toxic-appearing or diaphoretic.  HENT:     Mouth/Throat:     Mouth: Mucous membranes are moist.  Eyes:     Extraocular Movements: Extraocular movements intact.     Conjunctiva/sclera: Conjunctivae normal.     Pupils: Pupils are equal, round, and reactive to light.  Cardiovascular:     Rate and Rhythm: Normal rate and regular rhythm.     Heart sounds: No murmur heard. Pulmonary:     Effort:  Pulmonary effort is normal. No respiratory distress.     Breath sounds: Normal breath sounds. No stridor. No wheezing, rhonchi or rales.  Musculoskeletal:     Comments: There is no calf tenderness or erythema of the right leg.  There is no edema.  There is no ecchymosis and no deformity.  [place] is negative.    Skin:    Coloration: Skin is not jaundiced or pale.  Neurological:     General: No focal deficit present.     Mental Status: She is alert.  Psychiatric:        Behavior: Behavior normal.      UC Treatments / Results  Labs (all labs ordered are listed, but only abnormal results are displayed) Labs Reviewed - No data to display  EKG   Radiology No results found.  Procedures Procedures (including critical care time)  Medications Ordered in UC Medications - No data to display  Initial Impression / Assessment and Plan / UC Course  I have reviewed the triage vital signs and the nursing notes.  Pertinent labs & imaging results that were available during my care of the patient were reviewed by me and considered in my medical decision making (see chart for  details).     Vital signs are reassuring.  Her exam also is very reassuring.  Still with her history I think is best to order venous Doppler to check for DVT.  That is ordered.  It is too late in the day for us  to get it done same-day, so she will go to the hospital tomorrow to get it done.  She asked am I going to die overnight  I have gone over what the symptoms of PE and a DVT would look like.  If she worsens in any way she is to go to the emergency room immediately.  I think with the way she is presenting and her exam and vital signs tonight, that she is safe to wait until tomorrow to do the study. Final Clinical Impressions(s) / UC Diagnoses   Final diagnoses:  Right leg pain     Discharge Instructions      Please go to [facility] tomorrow to have the ultrasound done of the veins of the right leg.  Their  staff should let us  know tomorrow if there is anything abnormal that needs immediate treatment on that testing.   If you worsen overnight, please go directly to the emergency room.    ED Prescriptions   None    PDMP not reviewed this encounter.   Vonna Sharlet POUR, MD 03/10/24 [phone]

## 2024-03-10 NOTE — Discharge Instructions (Signed)
 Please go to [facility] tomorrow to have the ultrasound done of the veins of the right leg.  Their staff should let us  know tomorrow if there is anything abnormal that needs immediate treatment on that testing.   If you worsen overnight, please go directly to the emergency room.

## 2024-03-10 NOTE — ED Triage Notes (Signed)
 Pain in the shin of the right leg onset 2 hours ago. Patient states she is sick a lot and in bed often. Concerned for a DVT. States the lower right leg is hot to the touch. No redness or swelling currently.   Denies any trauma to the lower leg.

## 2024-03-11 ENCOUNTER — Ambulatory Visit (HOSPITAL_COMMUNITY)
Admission: RE | Admit: 2024-03-11 | Discharge: 2024-03-11 | Disposition: A | Discharge status: Home / Self Care | Source: Ambulatory Visit | Attending: Family Medicine | Admitting: Family Medicine

## 2024-03-11 DIAGNOSIS — R252 Cramp and spasm: Secondary | ICD-10-CM | POA: Insufficient documentation

## 2024-03-11 DIAGNOSIS — I82401 Acute embolism and thrombosis of unspecified deep veins of right lower extremity: Secondary | ICD-10-CM | POA: Diagnosis not present

## 2024-03-11 DIAGNOSIS — M79604 Pain in right leg: Secondary | ICD-10-CM | POA: Insufficient documentation

## 2024-03-12 ENCOUNTER — Ambulatory Visit (HOSPITAL_COMMUNITY): Payer: Self-pay

## 2024-03-27 ENCOUNTER — Encounter (INDEPENDENT_AMBULATORY_CARE_PROVIDER_SITE_OTHER): Payer: Self-pay | Admitting: Otolaryngology

## 2024-03-27 ENCOUNTER — Ambulatory Visit (INDEPENDENT_AMBULATORY_CARE_PROVIDER_SITE_OTHER): Admitting: Otolaryngology

## 2024-03-27 VITALS — BP 99/70 | HR 95 | Temp 98.3°F | Ht 65.0 in | Wt 165.0 lb

## 2024-03-27 DIAGNOSIS — H9203 Otalgia, bilateral: Secondary | ICD-10-CM

## 2024-03-27 DIAGNOSIS — J310 Chronic rhinitis: Secondary | ICD-10-CM | POA: Diagnosis not present

## 2024-03-27 DIAGNOSIS — M329 Systemic lupus erythematosus, unspecified: Secondary | ICD-10-CM | POA: Insufficient documentation

## 2024-03-27 MED ORDER — METHOCARBAMOL 500 MG PO TABS: 500.0000 mg | ORAL_TABLET | Freq: Every evening | ORAL | 3 refills | Status: AC | PRN

## 2024-03-27 NOTE — Progress Notes (Signed)
 Patient ID: Jordan Mayer, female   DOB: Jul 25, 1970, 53 y.o.   MRN: [id number]  New complaint: Ear pain Follow-up: Chronic nasal congestion  Discussed the use of AI scribe software for clinical note transcription with the patient, who gave verbal consent to proceed.  History of Present Illness Jordan Mayer is a 53 year old female with lupus who presents with recurrent ear pain.   She experiences significant intermittent ear pain, sometimes severe enough to disrupt her sleep. The pain occasionally radiates to her jaw, and she reports tenderness under her jawbone. She describes the pain as 'a lot'.  She has a history of lupus, which causes body pain and may contribute to her current symptoms. She avoids NSAIDs for her headaches and occasionally uses muscle relaxants like Flexeril, which induces drowsiness.  She has been experiencing neck issues and a pinched nerve, which have been investigated in the past without a clear diagnosis. There is a history of referral pain, and physical therapists have inquired about a possible cervical rib, though this has not been confirmed.  No significant hearing difficulties; she can hear her friends, family, and the television without issue.  She would like to have her hearing tested.  The patient has a history of chronic nasal congestion.  She underwent septoplasty and turbinate reduction surgery in 2023.  She reports significant improvement in her nasal breathing after the surgery.   Exam: General: Communicates without difficulty, well nourished, no acute distress. Head: Normocephalic, no evidence injury, no tenderness, facial buttresses intact without stepoff. Face/sinus: No tenderness to palpation and percussion. Facial movement is normal and symmetric. Eyes: PERRL, EOMI. No scleral icterus, conjunctivae clear. Neuro: CN II exam reveals vision grossly intact.  No nystagmus at any point of gaze. Ears: Auricles well formed without lesions.  Ear canals are  intact without mass or lesion.  No erythema or edema is appreciated.  The TMs are intact without fluid. Nose: External evaluation reveals normal support and skin without lesions.  Dorsum is intact.  Anterior rhinoscopy reveals mildly congested mucosa over anterior aspect of inferior turbinates and intact septum.  No purulence noted. Oral:  Oral cavity and oropharynx are intact, symmetric, without erythema or edema.  Mucosa is moist without lesions. Neck: Full range of motion without pain.  There is no significant lymphadenopathy.  No masses palpable.  Thyroid  bed within normal limits to palpation.  Parotid glands and submandibular glands equal bilaterally without mass.  Trachea is midline. Neuro:  CN 2-12 grossly intact.    Assessment and Plan Assessment & Plan Referred otalgia likely secondary to temporomandibular joint dysfunction and musculoskeletal pain Intermittent ear pain, significant enough to wake her at night, with no signs of infection or inflammation in the ear canal or eardrum. Pain likely referred from temporomandibular joint dysfunction or musculoskeletal pain.  - Prescribed Robaxin for muscle relaxation and pain management.  The possible side effects are discussed. - Scheduled hearing test to rule out any hearing issues. - Will follow up in 3-4 months to reassess ear condition.  Systemic lupus erythematosus with associated musculoskeletal symptoms Systemic lupus erythematosus with associated musculoskeletal pain. Discussed the autoimmune nature of lupus and its potential to cause various symptoms, including musculoskeletal pain.  Chronic rhinitis with nasal mucosal congestion. - Her septum and turbinates are well-healed.  Her nasal passageways are patent.

## 2024-04-27 ENCOUNTER — Other Ambulatory Visit (INDEPENDENT_AMBULATORY_CARE_PROVIDER_SITE_OTHER): Payer: Self-pay | Admitting: Otolaryngology

## 2024-04-27 DIAGNOSIS — H903 Sensorineural hearing loss, bilateral: Secondary | ICD-10-CM

## 2024-05-04 ENCOUNTER — Ambulatory Visit (INDEPENDENT_AMBULATORY_CARE_PROVIDER_SITE_OTHER): Admitting: Audiology

## 2024-05-08 ENCOUNTER — Telehealth (INDEPENDENT_AMBULATORY_CARE_PROVIDER_SITE_OTHER): Admitting: Internal Medicine

## 2024-05-08 ENCOUNTER — Encounter: Payer: Self-pay | Admitting: Internal Medicine

## 2024-05-08 ENCOUNTER — Telehealth: Payer: Self-pay

## 2024-05-08 DIAGNOSIS — Z79899 Other long term (current) drug therapy: Secondary | ICD-10-CM | POA: Diagnosis not present

## 2024-05-08 DIAGNOSIS — R5383 Other fatigue: Secondary | ICD-10-CM | POA: Diagnosis not present

## 2024-05-08 DIAGNOSIS — U071 COVID-19: Secondary | ICD-10-CM | POA: Diagnosis not present

## 2024-05-08 DIAGNOSIS — M328 Other forms of systemic lupus erythematosus: Secondary | ICD-10-CM

## 2024-05-08 DIAGNOSIS — N182 Chronic kidney disease, stage 2 (mild): Secondary | ICD-10-CM

## 2024-05-08 MED ORDER — NIRMATRELVIR/RITONAVIR (PAXLOVID) TABLET (RENAL DOSING): 2.0000 | ORAL_TABLET | Freq: Two times a day (BID) | ORAL | 0 refills | Status: AC

## 2024-05-08 NOTE — Progress Notes (Signed)
 " Virtual Visit via Video Note  I connected with Jordan Mayer on 05/08/2024 at  4:00 PM EST by a video enabled telemedicine application and verified that I am speaking with the correct person using two identifiers. Location patient: home Location provider:work office Persons participating in the virtual visit: patient, provider   Patient aware  of the limitations of evaluation and management by telemedicine and  availability of in person appointments. and agreed to proceed.   HPI: Jordan Mayer presents for video visit with pos covid test and sx of such  see rooming info .    Feverish but no specific . Elevated temp  not a log but some coughing a lot of of eye sx tearing a bit . Vomiting  diarrhea yesterday . Less input today  trying to eat  fels hydrated enough  Son had covid about 10 days ago .  Has had flu vaccine and Covid  vaccine  oct 25.  Under rx for lupus   hydroxychloroquine   and  rheum  prev dr Mai and now with [facility].  Renal function egfr 49 told ckd 2  ROS: See pertinent positives and negatives per HPI.  Past Medical History:  Diagnosis Date   ADD (attention deficit disorder)    Allergy    Anxiety    Asthma    Chicken pox    as child   Chronic migraines    Concussion    x 2 no residual   Depression    Family history of melanoma    Gallstones    GERD (gastroesophageal reflux disease)    Kidney stones    Lupus    PCOS (polycystic ovarian syndrome)    Pneumonia 2003   Renal disorder    kidney disease   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    Shingles     Past Surgical History:  Procedure Laterality Date   BREAST BIOPSY Left    2019   BUNIONECTOMY Right    CHOLECYSTECTOMY     COLONOSCOPY     CYSTOSCOPY W/ URETERAL STENT PLACEMENT Left 12/29/2022   Procedure: CYSTOSCOPY WITH LEFT RETROGRADE PYELOGRAM/URETERAL STENT PLACEMENT;  Surgeon: Gaston Hamilton, MD;  Location: [facility];  Service: Urology;  Laterality: Left;   CYSTOSCOPY/URETEROSCOPY/HOLMIUM  LASER/STENT PLACEMENT Left 01/05/2023   Procedure: CYSTOSCOPY, LEFT URETEROSCOPY, HOLMIUM LASER LITHOTRIPSY, AND LEFT URETERAL STENT PLACEMENT;  Surgeon: Devere Lonni Righter, MD;  Location: [facility];  Service: Urology;  Laterality: Left;  45 MINUTES   DILATATION & CURETTAGE/HYSTEROSCOPY WITH MYOSURE N/A 06/15/2023   Procedure: DILATATION & CURETTAGE/HYSTEROSCOPY WITH MYOSURE;  Surgeon: Diedre Rosaline BRAVO, MD;  Location: [facility];  Service: Gynecology;  Laterality: N/A;  instrument needed = myosure lite   HIATAL HERNIA REPAIR     with gastric sleeve   LAPAROSCOPIC CHOLECYSTECTOMY SINGLE SITE WITH INTRAOPERATIVE CHOLANGIOGRAM N/A 06/14/2017   Procedure: LAPAROSCOPIC CHOLECYSTECTOMY SINGLE SITE;  Surgeon: Sheldon Standing, MD;  Location: [facility];  Service: General;  Laterality: N/A;   LAPAROSCOPIC GASTRIC SLEEVE RESECTION  09/2013   LASER ABLATION     Vascular   NASAL SINUS SURGERY  12/2021   TONSILLECTOMY  1988   WISDOM TOOTH EXTRACTION      Family History  Problem Relation Age of Onset   Varicose Veins Mother    Asthma Mother    Myasthenia gravis Mother        Ocular   Varicose Veins Sister    Arthritis Maternal Grandmother    Arthritis Paternal Actor  Hearing loss Paternal Grandfather    Healthy Son    Healthy Son    Birth defects Paternal Aunt        Cognitively Challenged   Miscarriages / Stillbirths Paternal Aunt    Varicose Veins Paternal Aunt    Varicose Veins Paternal Aunt    Melanoma Paternal Aunt        melanoma x2   Prostate cancer Cousin    Colon cancer Neg Hx    Colon polyps Neg Hx    Esophageal cancer Neg Hx    Rectal cancer Neg Hx    Stomach cancer Neg Hx     Social History[1]   Current Medications[2]  EXAM: BP Readings from Last 3 Encounters:  03/27/24 99/70  03/10/24 100/69  01/18/24 117/81    VITALS per patient if applicable:  GENERAL: alert, oriented, moderatly ill non toxic  hoarse no stridor   nl speech  otherwise   HEENT: atraumatic, conjunttiva clear, no obvious abnormalities on inspection of external nose and ears  NECK: normal movements of the head and neck  LUNGS: on inspection no signs of respiratory distress, breathing rate appears normal, no obvious gross SOB, gasping or wheezing  CV: no obvious cyanosis  PSYCH/NEURO: pleasant and cooperative, no obvious depression or anxiety, speech and thought processing grossly intact Lab Results  Component Value Date   WBC 6.1 06/24/2023   HGB 14.5 06/24/2023   HCT 42.5 06/24/2023   PLT 267 06/24/2023   GLUCOSE 92 06/24/2023   CHOL 187 06/12/2020   TRIG 115 06/02/2021   HDL 46.60 06/12/2020   LDLCALC 102 (H) 06/12/2020   ALT 19 01/25/2023   AST 17 01/25/2023   NA 138 06/24/2023   K 3.8 06/24/2023   CL 104 06/24/2023   CREATININE 1.19 (H) 06/24/2023   BUN 14 06/24/2023   CO2 22 06/24/2023   TSH 2.478 12/30/2022   HGBA1C 5.7 (A) 08/25/2022    ASSESSMENT AND PLAN:  Discussed the following assessment and plan:    ICD-10-CM   1. COVID-19 virus infection  U07.1     2. High risk medication use  Z79.899     3. Other forms of systemic lupus erythematosus, unspecified organ involvement status (HCC)  M32.8     4. Other fatigue  R53.83     5. Stage 2 chronic kidney disease  N18.2    by report  last gfr in [facility] was gfr 49    Plan dec dose paxlovid  for dec gfr . At risk from underlying conditions  Although immunized   Risk benefit of medication discussed. On Day 4 so begin   Fu with complicating sx alarm sx to medical team Counseled.  On length of sx  possible  Expectant management and discussion of plan and treatment with opportunity to ask questions and all were answered. The patient agreed with the plan and demonstrated an understanding of the instructions.   Advised to call back or seek an in-person evaluation if worsening  or having  further concerns  in interim. Return if symptoms worsen or fail to  improve as expected.    Apolinar Eastern, MD     [1]  Social History Tobacco Use   Smoking status: Never    Passive exposure: Never   Smokeless tobacco: Never  Vaping Use   Vaping status: Never Used  Substance Use Topics   Alcohol use: Not Currently   Drug use: No  [2]  Current Outpatient Medications:    Cholecalciferol (D3 PO),  Take by mouth. 2000 mg daily, Disp: , Rfl:    EPINEPHrine  0.3 mg/0.3 mL IJ SOAJ injection, , Disp: , Rfl:    hydroxychloroquine  (PLAQUENIL ) 200 MG tablet, Take 200mg  by mouth twice daily, Monday through Friday only. None on Saturday or Sunday., Disp: 120 tablet, Rfl: 0   methocarbamol  (ROBAXIN ) 500 MG tablet, Take 1 tablet (500 mg total) by mouth at bedtime as needed (ear pain)., Disp: 30 tablet, Rfl: 3   Multiple Vitamin (MULTIVITAMIN) tablet, Take 1 tablet by mouth daily., Disp: , Rfl:    nirmatrelvir /ritonavir , renal dosing, (PAXLOVID ) 10 x 150 MG & 10 x 100MG  TABS, Take 2 tablets by mouth 2 (two) times daily for 5 days. 49(Take nirmatrelvir  150 mg one tablet twice daily for 5 days and ritonavir  100 mg one tablet twice daily for 5 days) Patient GFR is 49, Disp: 20 tablet, Rfl: 0   ondansetron  (ZOFRAN -ODT) 8 MG disintegrating tablet, Take 1 tablet (8 mg total) by mouth 2 (two) times daily., Disp: 60 tablet, Rfl: 1   progesterone (PROMETRIUM) 200 MG capsule, Take 200 mg by mouth at bedtime., Disp: , Rfl:    spironolactone  (ALDACTONE ) 50 MG tablet, Take 1 tablet (50 mg total) by mouth daily., Disp: 90 tablet, Rfl: 3   traZODone  (DESYREL ) 100 MG tablet, Take 2 tablets (200 mg total) by mouth at bedtime., Disp: , Rfl:    venlafaxine  (EFFEXOR ) 37.5 MG tablet, Take 1 tablet (37.5 mg total) by mouth 2 (two) times daily., Disp: , Rfl:    nitrofurantoin , macrocrystal-monohydrate, (MACROBID ) 100 MG capsule, Take 100 mg by mouth every 12 (twelve) hours. (Patient not taking: Reported on 05/08/2024), Disp: , Rfl:   "

## 2024-05-08 NOTE — Telephone Encounter (Signed)
 Pt has vv with Dr. Charlett at 4:00pm today.

## 2024-05-08 NOTE — Telephone Encounter (Signed)
 Copied from CRM #[id number]. Topic: Clinical - Medical Advice >> May 08, 2024  9:20 AM Thersia BROCKS wrote: Reason for CRM: Patient called in stated she has tested positive for covid, wanted to know if a prescription for paxlovid  could be sent to pharmacy

## 2024-06-15 ENCOUNTER — Telehealth (INDEPENDENT_AMBULATORY_CARE_PROVIDER_SITE_OTHER): Payer: Self-pay | Admitting: Otolaryngology

## 2024-06-15 NOTE — Telephone Encounter (Signed)
 The patient called in a left a message this morning requesting us  to cancel her upcoming hearing test and that she no longer wants this test and not to reschedule.  Please advise patient about her upcoming follow up with Dr Karis.

## 2024-06-18 ENCOUNTER — Ambulatory Visit (INDEPENDENT_AMBULATORY_CARE_PROVIDER_SITE_OTHER): Admitting: Audiology

## 2024-06-26 ENCOUNTER — Ambulatory Visit (INDEPENDENT_AMBULATORY_CARE_PROVIDER_SITE_OTHER): Admitting: Otolaryngology
